# Patient Record
Sex: Male | Born: 1975 | Race: White | Hispanic: No | Marital: Single | Smoking: Never smoker
Health system: Southern US, Community
[De-identification: ages and names within clinical notes are randomized; demographics above are authoritative.]

## PROBLEM LIST (undated history)

## (undated) DIAGNOSIS — F329 Major depressive disorder, single episode, unspecified: Secondary | ICD-10-CM

## (undated) DIAGNOSIS — D649 Anemia, unspecified: Secondary | ICD-10-CM

## (undated) DIAGNOSIS — T7840XA Allergy, unspecified, initial encounter: Secondary | ICD-10-CM

## (undated) DIAGNOSIS — F32A Depression, unspecified: Secondary | ICD-10-CM

## (undated) DIAGNOSIS — K219 Gastro-esophageal reflux disease without esophagitis: Secondary | ICD-10-CM

## (undated) DIAGNOSIS — L709 Acne, unspecified: Secondary | ICD-10-CM

## (undated) DIAGNOSIS — I10 Essential (primary) hypertension: Secondary | ICD-10-CM

## (undated) DIAGNOSIS — E785 Hyperlipidemia, unspecified: Secondary | ICD-10-CM

## (undated) DIAGNOSIS — F419 Anxiety disorder, unspecified: Secondary | ICD-10-CM

## (undated) DIAGNOSIS — K589 Irritable bowel syndrome without diarrhea: Secondary | ICD-10-CM

## (undated) DIAGNOSIS — K644 Residual hemorrhoidal skin tags: Secondary | ICD-10-CM

## (undated) DIAGNOSIS — H509 Unspecified strabismus: Secondary | ICD-10-CM

## (undated) HISTORY — DX: Unspecified strabismus: H50.9

## (undated) HISTORY — DX: Depression, unspecified: F32.A

## (undated) HISTORY — DX: Anxiety disorder, unspecified: F41.9

## (undated) HISTORY — PX: STRABISMUS SURGERY: SHX218

## (undated) HISTORY — DX: Acne, unspecified: L70.9

## (undated) HISTORY — PX: WISDOM TOOTH EXTRACTION: SHX21

## (undated) HISTORY — DX: Essential (primary) hypertension: I10

## (undated) HISTORY — DX: Major depressive disorder, single episode, unspecified: F32.9

## (undated) HISTORY — DX: Anemia, unspecified: D64.9

## (undated) HISTORY — DX: Gastro-esophageal reflux disease without esophagitis: K21.9

## (undated) HISTORY — DX: Hyperlipidemia, unspecified: E78.5

## (undated) HISTORY — DX: Irritable bowel syndrome, unspecified: K58.9

## (undated) HISTORY — PX: EYE SURGERY: SHX253

## (undated) HISTORY — DX: Residual hemorrhoidal skin tags: K64.4

## (undated) HISTORY — PX: FLEXIBLE SIGMOIDOSCOPY: SHX1649

## (undated) HISTORY — DX: Allergy, unspecified, initial encounter: T78.40XA

---

## 2004-01-16 ENCOUNTER — Encounter: Payer: Self-pay | Admitting: Internal Medicine

## 2005-03-12 ENCOUNTER — Ambulatory Visit: Payer: Self-pay | Admitting: Internal Medicine

## 2005-09-03 ENCOUNTER — Ambulatory Visit: Payer: Self-pay | Admitting: Internal Medicine

## 2005-09-10 ENCOUNTER — Ambulatory Visit: Payer: Self-pay | Admitting: Internal Medicine

## 2005-10-27 ENCOUNTER — Ambulatory Visit: Payer: Self-pay | Admitting: Internal Medicine

## 2005-11-10 ENCOUNTER — Ambulatory Visit: Payer: Self-pay | Admitting: Internal Medicine

## 2006-03-04 ENCOUNTER — Ambulatory Visit: Payer: Self-pay | Admitting: Internal Medicine

## 2006-03-11 ENCOUNTER — Ambulatory Visit: Payer: Self-pay | Admitting: Internal Medicine

## 2006-05-05 ENCOUNTER — Ambulatory Visit: Payer: Self-pay | Admitting: Internal Medicine

## 2006-05-11 ENCOUNTER — Ambulatory Visit: Payer: Self-pay | Admitting: Internal Medicine

## 2006-07-04 ENCOUNTER — Ambulatory Visit: Payer: Self-pay | Admitting: Internal Medicine

## 2006-07-11 ENCOUNTER — Ambulatory Visit: Payer: Self-pay | Admitting: Internal Medicine

## 2007-01-11 ENCOUNTER — Ambulatory Visit: Payer: Self-pay | Admitting: Internal Medicine

## 2007-01-11 LAB — CONVERTED CEMR LAB
ALT: 21 units/L (ref 0–40)
AST: 21 units/L (ref 0–37)
Albumin: 4.7 g/dL (ref 3.5–5.2)
Alkaline Phosphatase: 65 units/L (ref 39–117)
Bilirubin, Direct: 0.2 mg/dL (ref 0.0–0.3)
Cholesterol: 153 mg/dL (ref 0–200)
Direct LDL: 78.1 mg/dL
HDL: 34.8 mg/dL — ABNORMAL LOW (ref >39.0)
Total Bilirubin: 1.3 mg/dL — ABNORMAL HIGH (ref 0.3–1.2)
Total CHOL/HDL Ratio: 4.4
Total Protein: 7.2 g/dL (ref 6.0–8.3)
Triglycerides: 281 mg/dL (ref 0–149)
VLDL: 56 mg/dL — ABNORMAL HIGH (ref 0–40)

## 2007-01-18 ENCOUNTER — Ambulatory Visit: Payer: Self-pay | Admitting: Internal Medicine

## 2007-02-27 ENCOUNTER — Ambulatory Visit: Payer: Self-pay | Admitting: Internal Medicine

## 2007-02-27 LAB — CONVERTED CEMR LAB
ALT: 21 units/L (ref 0–40)
AST: 25 units/L (ref 0–37)
Albumin: 4.6 g/dL (ref 3.5–5.2)
Alkaline Phosphatase: 48 units/L (ref 39–117)
Bilirubin, Direct: 0.2 mg/dL (ref 0.0–0.3)
Cholesterol: 132 mg/dL (ref 0–200)
HDL: 38 mg/dL — ABNORMAL LOW (ref >39.0)
LDL Cholesterol: 73 mg/dL (ref 0–99)
Total Bilirubin: 0.9 mg/dL (ref 0.3–1.2)
Total CHOL/HDL Ratio: 3.5
Total Protein: 7.1 g/dL (ref 6.0–8.3)
Triglycerides: 103 mg/dL (ref 0–149)
VLDL: 21 mg/dL (ref 0–40)

## 2007-03-06 ENCOUNTER — Ambulatory Visit: Payer: Self-pay | Admitting: Internal Medicine

## 2007-05-25 DIAGNOSIS — F329 Major depressive disorder, single episode, unspecified: Secondary | ICD-10-CM

## 2007-05-25 DIAGNOSIS — F32A Depression, unspecified: Secondary | ICD-10-CM | POA: Insufficient documentation

## 2007-08-22 ENCOUNTER — Ambulatory Visit: Payer: Self-pay | Admitting: Internal Medicine

## 2007-09-05 ENCOUNTER — Ambulatory Visit: Payer: Self-pay | Admitting: Internal Medicine

## 2007-09-05 DIAGNOSIS — E785 Hyperlipidemia, unspecified: Secondary | ICD-10-CM | POA: Insufficient documentation

## 2007-09-05 LAB — CONVERTED CEMR LAB
ALT: 14 units/L (ref 0–53)
AST: 20 units/L (ref 0–37)
Albumin: 4.6 g/dL (ref 3.5–5.2)
Alkaline Phosphatase: 56 units/L (ref 39–117)
Bilirubin, Direct: 0.1 mg/dL (ref 0.0–0.3)
Cholesterol: 160 mg/dL (ref 0–200)
HDL: 40.2 mg/dL (ref >39.0)
LDL Cholesterol: 98 mg/dL (ref 0–99)
Total Bilirubin: 0.7 mg/dL (ref 0.3–1.2)
Total CHOL/HDL Ratio: 4
Total Protein: 7.3 g/dL (ref 6.0–8.3)
Triglycerides: 107 mg/dL (ref 0–149)
VLDL: 21 mg/dL (ref 0–40)

## 2007-09-12 ENCOUNTER — Ambulatory Visit: Payer: Self-pay | Admitting: Internal Medicine

## 2007-10-04 ENCOUNTER — Encounter: Payer: Self-pay | Admitting: Internal Medicine

## 2007-10-11 ENCOUNTER — Ambulatory Visit: Payer: Self-pay | Admitting: Internal Medicine

## 2008-03-05 ENCOUNTER — Ambulatory Visit: Payer: Self-pay | Admitting: Internal Medicine

## 2008-03-05 LAB — CONVERTED CEMR LAB
Blood in Urine, dipstick: NEGATIVE
Nitrite: NEGATIVE
Specific Gravity, Urine: 1.02
WBC Urine, dipstick: NEGATIVE

## 2008-03-06 LAB — CONVERTED CEMR LAB
ALT: 17 units/L (ref 0–53)
AST: 24 units/L (ref 0–37)
Albumin: 4.4 g/dL (ref 3.5–5.2)
Alkaline Phosphatase: 77 units/L (ref 39–117)
BUN: 10 mg/dL (ref 6–23)
Chloride: 105 meq/L (ref 96–112)
Eosinophils Relative: 31.9 % — ABNORMAL HIGH (ref 0.0–5.0)
Glucose, Bld: 82 mg/dL (ref 70–99)
HCT: 42.7 % (ref 39.0–52.0)
HDL: 31.9 mg/dL — ABNORMAL LOW (ref >39.0)
Monocytes Absolute: 0.5 10*3/uL (ref 0.1–1.0)
Monocytes Relative: 6.2 % (ref 3.0–12.0)
Neutrophils Relative %: 35.6 % — ABNORMAL LOW (ref 43.0–77.0)
Platelets: 285 10*3/uL (ref 150–400)
Potassium: 3.8 meq/L (ref 3.5–5.1)
Total CHOL/HDL Ratio: 4.3
Total Protein: 7.7 g/dL (ref 6.0–8.3)
WBC: 7.6 10*3/uL (ref 4.5–10.5)

## 2008-03-12 ENCOUNTER — Ambulatory Visit: Payer: Self-pay | Admitting: Internal Medicine

## 2008-03-13 LAB — CONVERTED CEMR LAB
Basophils Absolute: 0 10*3/uL (ref 0.0–0.1)
Lymphocytes Relative: 28.7 % (ref 12.0–46.0)
MCHC: 34.5 g/dL (ref 30.0–36.0)
Neutrophils Relative %: 49.6 % (ref 43.0–77.0)
RDW: 11.7 % (ref 11.5–14.6)

## 2008-04-18 ENCOUNTER — Ambulatory Visit: Payer: Self-pay | Admitting: Internal Medicine

## 2008-04-18 LAB — CONVERTED CEMR LAB
Basophils Absolute: 0 10*3/uL (ref 0.0–0.1)
Cholesterol: 220 mg/dL (ref 0–200)
Direct LDL: 122.9 mg/dL
Eosinophils Absolute: 0.2 10*3/uL (ref 0.0–0.7)
HDL: 28.6 mg/dL — ABNORMAL LOW (ref >39.0)
Hemoglobin: 14.3 g/dL (ref 13.0–17.0)
Lymphocytes Relative: 33.6 % (ref 12.0–46.0)
MCHC: 33.3 g/dL (ref 30.0–36.0)
MCV: 86.7 fL (ref 78.0–100.0)
Neutro Abs: 3.2 10*3/uL (ref 1.4–7.7)
RDW: 11.9 % (ref 11.5–14.6)
VLDL: 78 mg/dL — ABNORMAL HIGH (ref 0–40)

## 2008-04-24 ENCOUNTER — Ambulatory Visit: Payer: Self-pay | Admitting: Internal Medicine

## 2008-06-20 ENCOUNTER — Ambulatory Visit: Payer: Self-pay | Admitting: Internal Medicine

## 2008-06-20 LAB — CONVERTED CEMR LAB
ALT: 25 units/L (ref 0–53)
Albumin: 4.4 g/dL (ref 3.5–5.2)
Basophils Relative: 0.7 % (ref 0.0–3.0)
Eosinophils Absolute: 0.3 10*3/uL (ref 0.0–0.7)
Eosinophils Relative: 5.7 % — ABNORMAL HIGH (ref 0.0–5.0)
HCT: 40.8 % (ref 39.0–52.0)
HDL: 29.2 mg/dL — ABNORMAL LOW (ref >39.0)
Hemoglobin: 14 g/dL (ref 13.0–17.0)
MCV: 86.9 fL (ref 78.0–100.0)
Monocytes Absolute: 0.5 10*3/uL (ref 0.1–1.0)
Monocytes Relative: 8.3 % (ref 3.0–12.0)
Neutro Abs: 2.8 10*3/uL (ref 1.4–7.7)
Platelets: 234 10*3/uL (ref 150–400)
RBC: 4.7 M/uL (ref 4.22–5.81)
Total Protein: 6.7 g/dL (ref 6.0–8.3)
WBC: 5.8 10*3/uL (ref 4.5–10.5)

## 2008-06-27 ENCOUNTER — Ambulatory Visit: Payer: Self-pay | Admitting: Internal Medicine

## 2008-09-10 ENCOUNTER — Ambulatory Visit: Payer: Self-pay | Admitting: Internal Medicine

## 2008-09-10 DIAGNOSIS — K219 Gastro-esophageal reflux disease without esophagitis: Secondary | ICD-10-CM | POA: Insufficient documentation

## 2009-01-02 ENCOUNTER — Ambulatory Visit: Payer: Self-pay | Admitting: Internal Medicine

## 2009-01-02 LAB — CONVERTED CEMR LAB
ALT: 37 units/L (ref 0–53)
Bilirubin, Direct: 0.2 mg/dL (ref 0.0–0.3)
Cholesterol: 127 mg/dL (ref 0–200)
Eosinophils Absolute: 0.2 10*3/uL (ref 0.0–0.7)
HCT: 40 % (ref 39.0–52.0)
HDL: 32.8 mg/dL — ABNORMAL LOW (ref >39.0)
Hemoglobin: 14 g/dL (ref 13.0–17.0)
MCV: 86.3 fL (ref 78.0–100.0)
Monocytes Absolute: 0.4 10*3/uL (ref 0.1–1.0)
Monocytes Relative: 7.3 % (ref 3.0–12.0)
Neutro Abs: 3.2 10*3/uL (ref 1.4–7.7)
Platelets: 229 10*3/uL (ref 150–400)
RDW: 12.1 % (ref 11.5–14.6)
Total Bilirubin: 1.3 mg/dL — ABNORMAL HIGH (ref 0.3–1.2)
Total Protein: 7.2 g/dL (ref 6.0–8.3)
Triglycerides: 178 mg/dL — ABNORMAL HIGH (ref 0–149)

## 2009-01-09 ENCOUNTER — Ambulatory Visit: Payer: Self-pay | Admitting: Internal Medicine

## 2009-07-03 ENCOUNTER — Ambulatory Visit: Payer: Self-pay | Admitting: Internal Medicine

## 2009-07-03 LAB — CONVERTED CEMR LAB
AST: 24 units/L (ref 0–37)
Albumin: 4.5 g/dL (ref 3.5–5.2)
Alkaline Phosphatase: 60 units/L (ref 39–117)
Cholesterol: 125 mg/dL (ref 0–200)
HDL: 33.7 mg/dL — ABNORMAL LOW (ref >39.00)
Total CHOL/HDL Ratio: 4
Total Protein: 7.2 g/dL (ref 6.0–8.3)
Triglycerides: 175 mg/dL — ABNORMAL HIGH (ref 0.0–149.0)

## 2009-07-10 ENCOUNTER — Ambulatory Visit: Payer: Self-pay | Admitting: Internal Medicine

## 2009-11-18 ENCOUNTER — Telehealth: Payer: Self-pay | Admitting: Internal Medicine

## 2009-11-20 ENCOUNTER — Encounter: Payer: Self-pay | Admitting: Internal Medicine

## 2010-01-05 ENCOUNTER — Telehealth: Payer: Self-pay | Admitting: Internal Medicine

## 2010-01-20 ENCOUNTER — Ambulatory Visit: Payer: Self-pay | Admitting: Internal Medicine

## 2010-01-20 LAB — CONVERTED CEMR LAB
Albumin: 4.9 g/dL (ref 3.5–5.2)
Alkaline Phosphatase: 57 units/L (ref 39–117)
Basophils Relative: 0.4 % (ref 0.0–3.0)
CO2: 28 meq/L (ref 19–32)
Calcium: 9.6 mg/dL (ref 8.4–10.5)
Chloride: 106 meq/L (ref 96–112)
Eosinophils Absolute: 0.2 10*3/uL (ref 0.0–0.7)
Glucose, Bld: 81 mg/dL (ref 70–99)
Glucose, Urine, Semiquant: NEGATIVE
HCT: 40.9 % (ref 39.0–52.0)
Hemoglobin: 13.7 g/dL (ref 13.0–17.0)
Lymphocytes Relative: 34.3 % (ref 12.0–46.0)
Lymphs Abs: 1.6 10*3/uL (ref 0.7–4.0)
MCHC: 33.6 g/dL (ref 30.0–36.0)
MCV: 89 fL (ref 78.0–100.0)
Neutro Abs: 2.6 10*3/uL (ref 1.4–7.7)
Nitrite: NEGATIVE
Potassium: 3.7 meq/L (ref 3.5–5.1)
RBC: 4.59 M/uL (ref 4.22–5.81)
RDW: 11.9 % (ref 11.5–14.6)
Sodium: 140 meq/L (ref 135–145)
Specific Gravity, Urine: 1.01
TSH: 1.52 microintl units/mL (ref 0.35–5.50)
Total CHOL/HDL Ratio: 3
Total Protein: 7.6 g/dL (ref 6.0–8.3)
WBC Urine, dipstick: NEGATIVE
pH: 7

## 2010-01-29 ENCOUNTER — Ambulatory Visit: Payer: Self-pay | Admitting: Internal Medicine

## 2010-07-28 ENCOUNTER — Ambulatory Visit: Payer: Self-pay | Admitting: Internal Medicine

## 2010-07-28 LAB — CONVERTED CEMR LAB
ALT: 27 units/L (ref 0–53)
AST: 23 units/L (ref 0–37)
Albumin: 4.4 g/dL (ref 3.5–5.2)
Alkaline Phosphatase: 50 units/L (ref 39–117)
Total Bilirubin: 1 mg/dL (ref 0.3–1.2)
Total CHOL/HDL Ratio: 3
Triglycerides: 154 mg/dL — ABNORMAL HIGH (ref 0.0–149.0)

## 2010-08-03 ENCOUNTER — Ambulatory Visit: Payer: Self-pay | Admitting: Internal Medicine

## 2010-08-13 ENCOUNTER — Telehealth: Payer: Self-pay | Admitting: *Deleted

## 2010-08-22 ENCOUNTER — Encounter: Payer: Self-pay | Admitting: Internal Medicine

## 2010-09-21 ENCOUNTER — Ambulatory Visit: Payer: Self-pay | Admitting: Internal Medicine

## 2010-12-22 NOTE — Assessment & Plan Note (Signed)
Summary: 6 MTH ROV // RS   Vital Signs:  Patient profile:   35 year old male Height:      67 inches Weight:      177 pounds BMI:     27.82 Temp:     98.9 degrees F oral BP sitting:   120 / 86  (left arm) Cuff size:   regular  Vitals Entered By: Kern Reap CMA Duncan Dull) (August 03, 2010 10:43 AM) CC: follow-up visit   CC:  follow-up visit.  Current Medications (verified): 1)  Fluoxetine Hcl 20 Mg  Caps (Fluoxetine Hcl) .... One By Mouth Daily 2)  Clarinex 5 Mg  Tabs (Desloratadine) .... One By Mouth Daily 3)  Rhinocort Aqua 32 Mcg/act  Susp (Budesonide (Nasal)) .... Qd As Directed 4)  Astelin 137 Mcg/spray  Soln (Azelastine Hcl) .... Bid As Directed 5)  Zaleplon 10 Mg Caps (Zaleplon) .... Take 1 Tablet By Mouth At Bedtime As Needed 6)  Simvastatin 40 Mg Tabs (Simvastatin) .... Take 1/2  Tablet At Bedtime  Allergies (verified): No Known Drug Allergies  Review of Systems       Flu Vaccine Consent Questions     Do you have a history of severe allergic reactions to this vaccine? no    Any prior history of allergic reactions to egg and/or gelatin? no    Do you have a sensitivity to the preservative Thimersol? no    Do you have a past history of Guillan-Barre Syndrome? no    Do you currently have an acute febrile illness? no    Have you ever had a severe reaction to latex? no    Vaccine information given and explained to patient? yes    Are you currently pregnant? no    Lot Number:[id number]   Exp Date:05/22/2011   Site Given  Left Deltoid IM   Physical Exam  General:  alert and well-developed.   Lungs:  Normal respiratory effort, chest expands symmetrically. Lungs are clear to auscultation, no crackles or wheezes. Heart:  normal rate and regular rhythm.     Impression & Recommendations:  Problem # 1:  HYPERLIPIDEMIA (ICD-272.4)  His updated medication list for this problem includes:    Simvastatin 40 Mg Tabs (Simvastatin) .Marland Kitchen... Take 1/2  tablet at  bedtime  Problem # 2:  GERD (ICD-530.81) no sxs and no meds  Problem # 3:  DEPRESSION (ICD-311) controlled continue current medications  His updated medication list for this problem includes:    Fluoxetine Hcl 20 Mg Caps (Fluoxetine hcl) ..... One by mouth daily  Complete Medication List: 1)  Fluoxetine Hcl 20 Mg Caps (Fluoxetine hcl) .... One by mouth daily 2)  Clarinex 5 Mg Tabs (Desloratadine) .... One by mouth daily 3)  Rhinocort Aqua 32 Mcg/act Susp (Budesonide (nasal)) .... Qd as directed 4)  Astelin 137 Mcg/spray Soln (Azelastine hcl) .... Bid as directed 5)  Zaleplon 10 Mg Caps (Zaleplon) .... Take 1 tablet by mouth at bedtime as needed 6)  Simvastatin 40 Mg Tabs (Simvastatin) .... Take 1/2  tablet at bedtime  Other Orders: Admin 1st Vaccine (16109) Flu Vaccine 61yrs + (60454)  Patient Instructions: 1)  Please schedule a follow-up appointment in 6 months. CPX

## 2010-12-22 NOTE — Progress Notes (Signed)
Summary: refill on zaleplon  Phone Note From Pharmacy   Caller: [facility] Reason for Call: Needs renewal Details for Reason: zaleplon 10mg  Initial call taken by: Romualdo Bolk, CMA Duncan Dull),  August 13, 2010 2:59 PM  Follow-up for Phone Call        ok Follow-up by: Birdie Sons MD,  August 13, 2010 5:32 PM  Additional Follow-up for Phone Call Additional follow up Details #1::        Rx faxed to pharmacy Additional Follow-up by: Romualdo Bolk, CMA (AAMA),  August 14, 2010 11:03 AM    Prescriptions: ZALEPLON 10 MG CAPS (ZALEPLON) Take 1 tablet by mouth at bedtime as needed  #20 x 0   Entered by:   Romualdo Bolk, CMA (AAMA)   Authorized by:   Birdie Sons MD   Signed by:   Romualdo Bolk, CMA (AAMA) on 08/14/2010   Method used:   Handwritten   RxID:   [id number]

## 2010-12-22 NOTE — Assessment & Plan Note (Signed)
Summary: CPX//SLM/PT RESCD FROM BUMP//CCM   Vital Signs:  Patient profile:   35 year old male Height:      67 inches Weight:      182 pounds Temp:     98.8 degrees F oral Pulse rate:   67 / minute Resp:     12 per minute BP sitting:   120 / 68  Vitals Entered By: Lynann Beaver CMA (January 29, 2010 2:14 PM) CC: cpx Is Patient Diabetic? No Pain Assessment Patient in pain? no        CC:  cpx.  History of Present Illness: cpx  Current Problems (verified): 1)  Gerd  (ICD-530.81) 2)  Preventive Health Care  (ICD-V70.0) 3)  Hyperlipidemia  (ICD-272.4) 4)  Depression  (ICD-311)  Current Medications (verified): 1)  Fluoxetine Hcl 20 Mg  Caps (Fluoxetine Hcl) .... One By Mouth Daily 2)  Clarinex 5 Mg  Tabs (Desloratadine) .... One By Mouth Daily 3)  Rhinocort Aqua 32 Mcg/act  Susp (Budesonide (Nasal)) .... Qd As Directed 4)  Astelin 137 Mcg/spray  Soln (Azelastine Hcl) .... Bid As Directed 5)  Zaleplon 10 Mg Caps (Zaleplon) .... Take 1 Tablet By Mouth At Bedtime As Needed 6)  Simvastatin 40 Mg Tabs (Simvastatin) .... Take One Tablet At Bedtime 7)  Sudafed Pe Maximum Strength 10 Mg Tabs (Phenylephrine Hcl) .... Every Morning 8)  Tylenol Extra Strength 500 Mg Tabs (Acetaminophen) .... As Needed 9)  Advil 200 Mg Caps (Ibuprofen) .... As Needed 10)  Gas-X Extra Strength 125 Mg Caps (Simethicone) .... As Needed 11)  Saline Nasal Spray 0.65 % Soln (Saline) .... As Needed 12)  Visine Tears 0.2-0.2-1 % Soln (Glycerin-Hypromellose-Peg 400) .... As Needed  Allergies (verified): No Known Drug Allergies  Past History:  Past Medical History: Last updated: 09/10/2008 Allergic rhinitis Depression Acne GERD  Past Surgical History: Last updated: 03/12/2008 Denies surgical history  Family History: Last updated: 08/22/2007 Family History mother with breast CA' htn Family History Hypertension  Social History: Last updated: 05/25/2007 Single Alcohol use-yes Never  Smoked  Risk Factors: Smoking Status: never (05/25/2007)  Review of Systems       All other systems reviewed and were negative    Impression & Recommendations:  Problem # 1:  PREVENTIVE HEALTH CARE (ICD-V70.0) health maint UTD  Problem # 2:  HYPERLIPIDEMIA (ICD-272.4)  decrease simvastatin recheck 6 months His updated medication list for this problem includes:    Simvastatin 40 Mg Tabs (Simvastatin) .Marland Kitchen... Take 1/2  tablet at bedtime  Labs Reviewed: SGOT: 25 (01/20/2010)   SGPT: 29 (01/20/2010)   HDL:42.50 (01/20/2010), 33.70 (07/03/2009)  LDL:35 (01/20/2010), 56 (07/03/2009)  Chol:113 (01/20/2010), 125 (07/03/2009)  Trig:180.0 (01/20/2010), 175.0 (07/03/2009)  Complete Medication List: 1)  Fluoxetine Hcl 20 Mg Caps (Fluoxetine hcl) .... One by mouth daily 2)  Clarinex 5 Mg Tabs (Desloratadine) .... One by mouth daily 3)  Rhinocort Aqua 32 Mcg/act Susp (Budesonide (nasal)) .... Qd as directed 4)  Astelin 137 Mcg/spray Soln (Azelastine hcl) .... Bid as directed 5)  Zaleplon 10 Mg Caps (Zaleplon) .... Take 1 tablet by mouth at bedtime as needed 6)  Simvastatin 40 Mg Tabs (Simvastatin) .... Take 1/2  tablet at bedtime  Other Orders: Tdap => 41yrs IM (16109) Admin 1st Vaccine (60454)  Patient Instructions: 1)  Please schedule a follow-up appointment in 6 months.   Physical Exam General Appearance: well developed, well nourished, no acute distress Eyes: conjunctiva and lids normal, PERRL, EOMI,  Ears, Nose, Mouth, Throat: TM clear,  nares clear, oral exam WNL Neck: supple, no lymphadenopathy, no thyromegaly, no JVD Respiratory: clear to auscultation and percussion, respiratory effort normal Cardiovascular: regular rate and rhythm, S1-S2, no murmur, rub or gallop, no bruits, peripheral pulses normal and symmetric, no cyanosis, clubbing, edema or varicosities Chest: no scars, masses, tenderness; no asymmetry, skin changes, nipple discharge, no gynecomastia   Gastrointestinal:  soft, non-tender; no hepatosplenomegaly, masses; active bowel sounds all quadrants, guaiac negative stool; no masses, tenderness, hemorrhoids  Genitourinary: no hernia, testicular mass, penile discharge, priapism Lymphatic: no cervical, axillary or inguinal adenopathy Musculoskeletal: gait normal, muscle tone and strength WNL, no joint swelling, effusions, discoloration, crepitus  Skin: clear, good turgor, color WNL, no rashes, lesions, or ulcerations Neurologic: normal mental status, normal reflexes, normal strength, sensation, and motion Psychiatric: alert; oriented to person, place and time Other Exam:      Immunizations Administered:  Tetanus Vaccine:    Vaccine Type: Tdap    Site: left deltoid    Mfr: GlaxoSmithKline    Dose: 0.5 ml    Route: IM    Given by: Lynann Beaver CMA    Exp. Date: 01/17/2012    Lot #: [id number]   Prevention & Chronic Care Immunizations   Influenza vaccine: Fluvax Non-MCR  (09/04/2007)   Influenza vaccine due: 07/23/2010    Tetanus booster: 01/29/2010: Tdap    Pneumococcal vaccine: Not documented  Other Screening   Smoking status: never  (05/25/2007)  Lipids   Total Cholesterol: 113  (01/20/2010)   LDL: 35  (01/20/2010)   LDL Direct: 122.9  (04/18/2008)   HDL: 42.50  (01/20/2010)   Triglycerides: 180.0  (01/20/2010)    SGOT (AST): 25  (01/20/2010)   SGPT (ALT): 29  (01/20/2010)   Alkaline phosphatase: 57  (01/20/2010)   Total bilirubin: 1.5  (01/20/2010)  Self-Management Support :    Lipid self-management support: Not documented    Nursing Instructions: Give tetanus booster today    Immunizations Administered:  Tetanus Vaccine:    Vaccine Type: Tdap    Site: left deltoid    Mfr: GlaxoSmithKline    Dose: 0.5 ml    Route: IM    Given by: Lynann Beaver CMA    Exp. Date: 01/17/2012    Lot #: [id number]

## 2010-12-22 NOTE — Therapy (Signed)
Summary: Hearing Difficulties/[facility]  Hearing Difficulties/[facility]   Imported By: Maryln Gottron 10/09/2010 11:24:32  _____________________________________________________________________  External Attachment:    Type:   Image     Comment:   External Document

## 2010-12-22 NOTE — Medication Information (Signed)
Summary: Rx for Zaleplon Cap/[facility]  Rx for Zaleplon Cap/[facility]   Imported By: Maryln Gottron 11/24/2009 14:17:20  _____________________________________________________________________  External Attachment:    Type:   Image     Comment:   External Document

## 2010-12-22 NOTE — Assessment & Plan Note (Signed)
Summary: cold symptoms for over a week/cjr   Vital Signs:  Patient profile:   35 year old male Weight:      185 pounds Temp:     98.7 degrees F oral Pulse rhythm:   regular BP sitting:   138 / 80  Vitals Entered By: Lynann Beaver CMA AAMA (September 21, 2010 11:41 AM) CC: chest congestion and cough Is Patient Diabetic? No Pain Assessment Patient in pain? no        CC:  chest congestion and cough.  History of Present Illness: 2 week hx of illness. sxs have ben persistent/gradually worsening. seems to have started with a rash on chest now left with fatigue and persistent cough---especially nocturnal coughing paroxysms no fever/chills no sweats  Current Medications (verified): 1)  Fluoxetine Hcl 20 Mg  Caps (Fluoxetine Hcl) .... One By Mouth Daily 2)  Clarinex 5 Mg  Tabs (Desloratadine) .... One By Mouth Daily 3)  Rhinocort Aqua 32 Mcg/act  Susp (Budesonide (Nasal)) .... Qd As Directed 4)  Astelin 137 Mcg/spray  Soln (Azelastine Hcl) .... Bid As Directed 5)  Zaleplon 10 Mg Caps (Zaleplon) .... Take 1 Tablet By Mouth At Bedtime As Needed 6)  Simvastatin 40 Mg Tabs (Simvastatin) .... Take 1/2  Tablet At Bedtime  Allergies (verified): No Known Drug Allergies  Past History:  Past Medical History: Last updated: 09/10/2008 Allergic rhinitis Depression Acne GERD  Past Surgical History: Last updated: 03/12/2008 Denies surgical history  Family History: Last updated: 08/22/2007 Family History mother with breast CA' htn Family History Hypertension  Social History: Last updated: 05/25/2007 Single Alcohol use-yes Never Smoked  Risk Factors: Smoking Status: never (05/25/2007)  Physical Exam  General:  alert and well-developed.   Nose:  nose piercing noted and no external erythema.   Neck:  No deformities, masses, or tenderness noted. Lungs:  Normal respiratory effort, chest expands symmetrically. Lungs are clear to auscultation, no crackles or wheezes. Skin:   turgor normal and color normal.     Impression & Recommendations:  Problem # 1:  BRONCHITIS-ACUTE (ICD-466.0) by hx emperic ABX call if sxs persist His updated medication list for this problem includes:    Doxycycline Hyclate 100 Mg Caps (Doxycycline hyclate) .Marland Kitchen... Take 1 tab twice a day    Hydromet 5-1.5 Mg/39ml Syrp (Hydrocodone-homatropine) .Marland Kitchen... 1 tsp three times a day as needed cough  Complete Medication List: 1)  Fluoxetine Hcl 20 Mg Caps (Fluoxetine hcl) .... One by mouth daily 2)  Clarinex 5 Mg Tabs (Desloratadine) .... One by mouth daily 3)  Rhinocort Aqua 32 Mcg/act Susp (Budesonide (nasal)) .... Qd as directed 4)  Astelin 137 Mcg/spray Soln (Azelastine hcl) .... Bid as directed 5)  Zaleplon 10 Mg Caps (Zaleplon) .... Take 1 tablet by mouth at bedtime as needed 6)  Simvastatin 40 Mg Tabs (Simvastatin) .... Take 1/2  tablet at bedtime 7)  Doxycycline Hyclate 100 Mg Caps (Doxycycline hyclate) .... Take 1 tab twice a day 8)  Hydromet 5-1.5 Mg/56ml Syrp (Hydrocodone-homatropine) .Marland Kitchen.. 1 tsp three times a day as needed cough Prescriptions: HYDROMET 5-1.5 MG/5ML SYRP (HYDROCODONE-HOMATROPINE) 1 tsp three times a day as needed cough  #240cc x 0   Entered and Authorized by:   Birdie Sons MD   Signed by:   Birdie Sons MD on 09/21/2010   Method used:   Print then Give to Patient   RxID:   [id number] DOXYCYCLINE HYCLATE 100 MG CAPS (DOXYCYCLINE HYCLATE) Take 1 tab twice a day  #20 x 0  Entered and Authorized by:   Birdie Sons MD   Signed by:   Birdie Sons MD on 09/21/2010   Method used:   Electronically to        [facility] [address] (retail)       [phone]       [place], [place]  [address]       Ph: [phone]       Fax: [phone]   RxID:   [id number]    Orders Added: 1)  Est. Patient Level III [27253]

## 2010-12-22 NOTE — Progress Notes (Signed)
Summary: simvastatin  Phone Note Refill Request Message from:  Fax from Pharmacy  Refills Requested: Medication #1:  SIMVASTATIN 40 MG TABS Take one tablet at bedtime [facility] [facility]---[phone]        FAX---[phone]  Initial call taken by: Warnell Forester,  January 05, 2010 12:56 PM    Prescriptions: SIMVASTATIN 40 MG TABS (SIMVASTATIN) Take one tablet at bedtime  #90 Tablet x 1   Entered by:   Gladis Riffle, RN   Authorized by:   Birdie Sons MD   Signed by:   Gladis Riffle, RN on 01/05/2010   Method used:   Electronically to        [phone] (retail)       [address]       [place], [place]  [address]       Ph: [phone]       Fax: [phone]   RxID:   [id number]

## 2011-01-13 ENCOUNTER — Other Ambulatory Visit: Payer: Self-pay | Admitting: *Deleted

## 2011-01-13 MED ORDER — ZALEPLON 10 MG PO CAPS: 10.0000 mg | ORAL_CAPSULE | Freq: Every day | ORAL | Status: DC

## 2011-01-22 ENCOUNTER — Other Ambulatory Visit: Payer: PRIVATE HEALTH INSURANCE

## 2011-01-22 ENCOUNTER — Other Ambulatory Visit: Payer: Self-pay | Admitting: Internal Medicine

## 2011-01-22 ENCOUNTER — Encounter (INDEPENDENT_AMBULATORY_CARE_PROVIDER_SITE_OTHER): Payer: Self-pay | Admitting: *Deleted

## 2011-01-22 DIAGNOSIS — Z0000 Encounter for general adult medical examination without abnormal findings: Secondary | ICD-10-CM

## 2011-01-22 LAB — CBC WITH DIFFERENTIAL/PLATELET
Basophils Relative: 0.4 % (ref 0.0–3.0)
Eosinophils Absolute: 0.1 10*3/uL (ref 0.0–0.7)
Eosinophils Relative: 2.5 % (ref 0.0–5.0)
Lymphocytes Relative: 36 % (ref 12.0–46.0)
MCHC: 34.7 g/dL (ref 30.0–36.0)
Neutrophils Relative %: 52.6 % (ref 43.0–77.0)
Platelets: 238 10*3/uL (ref 150.0–400.0)
RBC: 4.69 Mil/uL (ref 4.22–5.81)
WBC: 5.5 10*3/uL (ref 4.5–10.5)

## 2011-01-22 LAB — URINALYSIS
Hgb urine dipstick: NEGATIVE
Nitrite: NEGATIVE
Total Protein, Urine: NEGATIVE
Urine Glucose: NEGATIVE
pH: 6.5 (ref 5.0–8.0)

## 2011-01-22 LAB — BASIC METABOLIC PANEL
BUN: 10 mg/dL (ref 6–23)
CO2: 27 mEq/L (ref 19–32)
Calcium: 9.7 mg/dL (ref 8.4–10.5)
Chloride: 106 mEq/L (ref 96–112)
Creatinine, Ser: 0.9 mg/dL (ref 0.4–1.5)
GFR: 101.98 mL/min (ref >60.00)
Glucose, Bld: 77 mg/dL (ref 70–99)
Potassium: 4.3 mEq/L (ref 3.5–5.1)
Sodium: 140 mEq/L (ref 135–145)

## 2011-01-22 LAB — LIPID PANEL
HDL: 38.1 mg/dL — ABNORMAL LOW (ref >39.00)
LDL Cholesterol: 60 mg/dL (ref 0–99)
VLDL: 34.6 mg/dL (ref 0.0–40.0)

## 2011-01-22 LAB — HEPATIC FUNCTION PANEL
Alkaline Phosphatase: 53 U/L (ref 39–117)
Bilirubin, Direct: 0.2 mg/dL (ref 0.0–0.3)
Total Bilirubin: 1.2 mg/dL (ref 0.3–1.2)
Total Protein: 6.9 g/dL (ref 6.0–8.3)

## 2011-01-29 ENCOUNTER — Other Ambulatory Visit: Payer: Self-pay

## 2011-02-05 ENCOUNTER — Encounter: Payer: Self-pay | Admitting: Internal Medicine

## 2011-02-16 ENCOUNTER — Encounter: Payer: Self-pay | Admitting: Internal Medicine

## 2011-02-17 ENCOUNTER — Encounter: Payer: Self-pay | Admitting: Internal Medicine

## 2011-02-17 ENCOUNTER — Ambulatory Visit (INDEPENDENT_AMBULATORY_CARE_PROVIDER_SITE_OTHER): Payer: PRIVATE HEALTH INSURANCE | Admitting: Internal Medicine

## 2011-02-17 VITALS — BP 118/82 | HR 80 | Temp 98.7°F | Ht 67.5 in | Wt 183.0 lb

## 2011-02-17 DIAGNOSIS — Z0000 Encounter for general adult medical examination without abnormal findings: Secondary | ICD-10-CM

## 2011-02-17 NOTE — Progress Notes (Signed)
  Subjective:    Patient ID: Garrett [name], male    DOB: 02/01/1976, 35 y.o.   MRN: [id number]  HPI cpx Past Medical History  Diagnosis Date  . Allergy   . Depression   . GERD (gastroesophageal reflux disease)   . Acne    No past surgical history on file.  reports that he has never smoked. He does not have any smokeless tobacco history on file. He reports that he drinks alcohol. His drug history not on file. family history includes Cancer in his mother and Hypertension in his mother. No Known Allergies    Review of Systems  patient denies chest pain, shortness of breath, orthopnea. Denies lower extremity edema, abdominal pain, change in appetite, change in bowel movements. Patient denies rashes, musculoskeletal complaints. No other specific complaints in a complete review of systems.      Objective:   Physical Exam Well-developed male in no acute distress. HEENT exam atraumatic, normocephalic, extraocular muscles are intact. Conjunctivae are pink without exudate. Neck is supple without lymphadenopathy, thyromegaly, jugular venous distention. Chest is clear to auscultation without increased work of breathing. Cardiac exam S1-S2 are regular. The PMI is normal. No significant murmurs or gallops. Abdominal exam active bowel sounds, soft, nontender. No abdominal bruits. Extremities no clubbing cyanosis or edema. Peripheral pulses are normal without bruits. Neurologic exam alert and oriented without any motor or sensory deficits.        Assessment & Plan:  Well visit  Health maint utd Advised weight loss, regular exercise

## 2011-04-05 ENCOUNTER — Other Ambulatory Visit: Payer: Self-pay | Admitting: Internal Medicine

## 2011-10-08 ENCOUNTER — Other Ambulatory Visit: Payer: Self-pay | Admitting: Internal Medicine

## 2011-11-08 ENCOUNTER — Other Ambulatory Visit: Payer: Self-pay | Admitting: Internal Medicine

## 2012-02-11 ENCOUNTER — Other Ambulatory Visit (INDEPENDENT_AMBULATORY_CARE_PROVIDER_SITE_OTHER): Payer: BC Managed Care – PPO

## 2012-02-11 DIAGNOSIS — Z0000 Encounter for general adult medical examination without abnormal findings: Secondary | ICD-10-CM

## 2012-02-11 LAB — HEPATIC FUNCTION PANEL
Alkaline Phosphatase: 51 U/L (ref 39–117)
Bilirubin, Direct: 0.1 mg/dL (ref 0.0–0.3)
Total Bilirubin: 0.9 mg/dL (ref 0.3–1.2)

## 2012-02-11 LAB — TSH: TSH: 1.61 u[IU]/mL (ref 0.35–5.50)

## 2012-02-11 LAB — CBC WITH DIFFERENTIAL/PLATELET
Basophils Absolute: 0 10*3/uL (ref 0.0–0.1)
Eosinophils Absolute: 0.4 10*3/uL (ref 0.0–0.7)
Lymphocytes Relative: 32.9 % (ref 12.0–46.0)
MCHC: 34 g/dL (ref 30.0–36.0)
Neutrophils Relative %: 52.4 % (ref 43.0–77.0)
RBC: 4.64 Mil/uL (ref 4.22–5.81)
RDW: 12.5 % (ref 11.5–14.6)

## 2012-02-11 LAB — LIPID PANEL
HDL: 45.1 mg/dL (ref >39.00)
Total CHOL/HDL Ratio: 3
VLDL: 31.8 mg/dL (ref 0.0–40.0)

## 2012-02-11 LAB — POCT URINALYSIS DIPSTICK
Bilirubin, UA: NEGATIVE
Blood, UA: NEGATIVE
Nitrite, UA: NEGATIVE
Urobilinogen, UA: 0.2
pH, UA: 6.5

## 2012-02-11 LAB — BASIC METABOLIC PANEL
CO2: 25 mEq/L (ref 19–32)
Calcium: 9.8 mg/dL (ref 8.4–10.5)
Creatinine, Ser: 0.7 mg/dL (ref 0.4–1.5)
Glucose, Bld: 84 mg/dL (ref 70–99)

## 2012-02-18 ENCOUNTER — Encounter: Payer: PRIVATE HEALTH INSURANCE | Admitting: Internal Medicine

## 2012-02-21 ENCOUNTER — Encounter: Payer: Self-pay | Admitting: Internal Medicine

## 2012-02-21 ENCOUNTER — Ambulatory Visit (INDEPENDENT_AMBULATORY_CARE_PROVIDER_SITE_OTHER): Payer: BC Managed Care – PPO | Admitting: Internal Medicine

## 2012-02-21 ENCOUNTER — Other Ambulatory Visit: Payer: Self-pay | Admitting: Internal Medicine

## 2012-02-21 DIAGNOSIS — Z0000 Encounter for general adult medical examination without abnormal findings: Secondary | ICD-10-CM

## 2012-02-21 DIAGNOSIS — J309 Allergic rhinitis, unspecified: Secondary | ICD-10-CM

## 2012-02-21 MED ORDER — ZALEPLON 10 MG PO CAPS: 10.0000 mg | ORAL_CAPSULE | Freq: Every evening | ORAL | Status: DC | PRN

## 2012-02-21 NOTE — Progress Notes (Signed)
Patient ID: LEVII HAIRFIELD, male   DOB: [birth year], 36 y.o.   MRN: [id number]  cpx He sees allergist regularly (note on sudafed bid scheduled).  Past Medical History  Diagnosis Date  . Allergy   . Depression   . GERD (gastroesophageal reflux disease)   . Acne     History   Social History  . Marital Status: Single    Spouse Name: N/A    Number of Children: N/A  . Years of Education: N/A   Occupational History  . Not on file.   Social History Main Topics  . Smoking status: Never Smoker   . Smokeless tobacco: Not on file  . Alcohol Use: Yes  . Drug Use:   . Sexually Active:    Other Topics Concern  . Not on file   Social History Narrative  . No narrative on file    No past surgical history on file.  Family History  Problem Relation Age of Onset  . Cancer Mother     cancer  . Hypertension Mother     No Known Allergies  Current Outpatient Prescriptions on File Prior to Visit  Medication Sig Dispense Refill  . azelastine (ASTELIN) 137 MCG/SPRAY nasal spray 1 spray by Nasal route 2 (two) times daily. Use in each nostril as directed       . budesonide (RHINOCORT AQUA) 32 MCG/ACT nasal spray 1 spray by Nasal route daily.        Marland Kitchen desloratadine (CLARINEX) 5 MG tablet Take 5 mg by mouth daily.        Marland Kitchen FLUoxetine (PROZAC) 20 MG capsule TAKE ONE CAPSULE BY MOUTH EVERY DAY  30 capsule  5  . phenylephrine (SUDAFED PE) 10 MG TABS Take 10 mg by mouth every 4 (four) hours as needed.        . simvastatin (ZOCOR) 40 MG tablet TAKE ONE TABLET BY MOUTH AT BEDTIME  90 tablet  2     patient denies chest pain, shortness of breath, orthopnea. Denies lower extremity edema, abdominal pain, change in appetite, change in bowel movements. Patient denies rashes, musculoskeletal complaints. No other specific complaints in a complete review of systems.   BP 142/86  Pulse 96  Temp(Src) 98.8 F (37.1 C) (Oral)  Ht 5\' 7"  (1.702 m)  Wt 185 lb (83.915 kg)  BMI 28.97 kg/m2 Well-developed  male in no acute distress. HEENT exam atraumatic, normocephalic, extraocular muscles are intact. Conjunctivae are pink without exudate. Neck is supple without lymphadenopathy, thyromegaly, jugular venous distention. Chest is clear to auscultation without increased work of breathing. Cardiac exam S1-S2 are regular. The PMI is normal. No significant murmurs or gallops. Abdominal exam active bowel sounds, soft, nontender. No abdominal bruits. Extremities no clubbing cyanosis or edema. Peripheral pulses are normal without bruits. Neurologic exam alert and oriented without any motor or sensory deficits.   A/P: well visit, health maintenance UTD  Blood Pressure--- he will monitor at home and call me with BP results.

## 2012-02-21 NOTE — Patient Instructions (Signed)
Monitor your BP at home:  Goal BP less than 135/85. Fax recordings to [phone] or drop them by the office

## 2012-04-04 ENCOUNTER — Other Ambulatory Visit: Payer: Self-pay | Admitting: Internal Medicine

## 2012-08-14 ENCOUNTER — Telehealth: Payer: Self-pay | Admitting: Internal Medicine

## 2012-08-14 ENCOUNTER — Ambulatory Visit (INDEPENDENT_AMBULATORY_CARE_PROVIDER_SITE_OTHER): Payer: BC Managed Care – PPO | Admitting: Internal Medicine

## 2012-08-14 ENCOUNTER — Encounter: Payer: Self-pay | Admitting: Internal Medicine

## 2012-08-14 VITALS — BP 122/88 | Temp 99.0°F | Wt 182.0 lb

## 2012-08-14 DIAGNOSIS — R197 Diarrhea, unspecified: Secondary | ICD-10-CM

## 2012-08-14 DIAGNOSIS — K529 Noninfective gastroenteritis and colitis, unspecified: Secondary | ICD-10-CM

## 2012-08-14 DIAGNOSIS — K589 Irritable bowel syndrome without diarrhea: Secondary | ICD-10-CM | POA: Insufficient documentation

## 2012-08-14 LAB — CBC WITH DIFFERENTIAL/PLATELET
Basophils Absolute: 0 10*3/uL (ref 0.0–0.1)
Basophils Relative: 0.5 % (ref 0.0–3.0)
Eosinophils Relative: 1.8 % (ref 0.0–5.0)
Hemoglobin: 14.4 g/dL (ref 13.0–17.0)
Lymphocytes Relative: 33.3 % (ref 12.0–46.0)
Monocytes Relative: 10 % (ref 3.0–12.0)
Neutro Abs: 2.6 10*3/uL (ref 1.4–7.7)
RBC: 4.86 Mil/uL (ref 4.22–5.81)

## 2012-08-14 MED ORDER — METRONIDAZOLE 250 MG PO TABS: 250.0000 mg | ORAL_TABLET | Freq: Three times a day (TID) | ORAL | Status: DC

## 2012-08-14 MED ORDER — CIPROFLOXACIN HCL 250 MG PO TABS: 250.0000 mg | ORAL_TABLET | Freq: Two times a day (BID) | ORAL | Status: DC

## 2012-08-14 NOTE — Assessment & Plan Note (Signed)
36 year old white male with intermittent diarrhea for last 2 weeks. He has low-grade fever. I suspect possible infectious etiology. Empiric treatment with Cipro 250 mg twice daily and metronidazole 250 mg 3 times a day for 10 days. Send stool for culture, stool lactoferrin, stools for C. Difficile by PCR and ova and parasites.  Also check CBCD, sed rate, thyroid studies and celiac panel.

## 2012-08-14 NOTE — Telephone Encounter (Signed)
Patient calling, has had diarrhea x 10 days and wants to know what to do.  He is urinating.  Denies any fever. Needs to be seen in 24 hours.  Scheduled this afternoon at 2p with Dr. Artist Pais.

## 2012-08-14 NOTE — Progress Notes (Signed)
Subjective:    Patient ID: Garrett [name], male    DOB: July 03, 1976, 36 y.o.   MRN: [id number]  HPI  36 year old white male complains of diarrhea for the last 2 weeks. His symptoms are intermittent. Patient describes 3-4 bowel movements per day. They can be liquid or loose in nature. He denies any blood or any mucus in the stools. No report of foreign travel. He also denies any recent antibiotic use.  He has abdominal cramping with diarrhea. He has taken Imodium over-the-counter and follow a bland diet which seems to help but then diarrhea returns when he resumes his normal diet.  He has had occasional rare episodes of loose stools in the past. He denies any family history of inflammatory bowel disease. He has low-grade fever today. He does not recall any unusual food intake.   Review of Systems Negative for fever chills, negative for joint pains or unusual rash.  Past Medical History  Diagnosis Date  . Allergy   . Depression   . GERD (gastroesophageal reflux disease)   . Acne     History   Social History  . Marital Status: Single    Spouse Name: N/A    Number of Children: N/A  . Years of Education: N/A   Occupational History  . Not on file.   Social History Main Topics  . Smoking status: Never Smoker   . Smokeless tobacco: Not on file  . Alcohol Use: Yes  . Drug Use:   . Sexually Active:    Other Topics Concern  . Not on file   Social History Narrative  . No narrative on file    No past surgical history on file.  Family History  Problem Relation Age of Onset  . Cancer Mother     cancer  . Hypertension Mother     No Known Allergies  Current Outpatient Prescriptions on File Prior to Visit  Medication Sig Dispense Refill  . azelastine (ASTELIN) 137 MCG/SPRAY nasal spray 1 spray by Nasal route 2 (two) times daily. Use in each nostril as directed       . budesonide (RHINOCORT AQUA) 32 MCG/ACT nasal spray 1 spray by Nasal route daily.        Marland Kitchen desloratadine  (CLARINEX) 5 MG tablet Take 5 mg by mouth daily.        Marland Kitchen FLUoxetine (PROZAC) 20 MG capsule TAKE ONE CAPSULE BY MOUTH EVERY DAY  30 capsule  11  . NON FORMULARY Allergy shots weekly      . phenylephrine (SUDAFED PE) 10 MG TABS Take 10 mg by mouth every 4 (four) hours as needed.        . simvastatin (ZOCOR) 40 MG tablet TAKE ONE TABLET BY MOUTH AT BEDTIME  90 tablet  2  . DISCONTD: zaleplon (SONATA) 10 MG capsule Take 1 capsule (10 mg total) by mouth at bedtime as needed.  15 capsule  1  . DISCONTD: zaleplon (SONATA) 10 MG capsule Take 1 capsule (10 mg total) by mouth at bedtime.  20 capsule  2    BP 122/88  Temp 99 F (37.2 C) (Oral)  Wt 182 lb (82.555 kg)       Objective:   Physical Exam  Constitutional: He is oriented to person, place, and time. He appears well-developed and well-nourished.  HENT:  Head: Normocephalic.  Right Ear: External ear normal.  Left Ear: External ear normal.  Mouth/Throat: Oropharynx is clear and moist.  Neck: Neck supple.  Cardiovascular: Normal rate,  regular rhythm and normal heart sounds.   Pulmonary/Chest: Effort normal and breath sounds normal. He has no wheezes.  Abdominal: Soft. Bowel sounds are normal. He exhibits no mass. There is no tenderness.  Genitourinary: Rectum normal. Guaiac negative stool.  Musculoskeletal: Normal range of motion. He exhibits no edema.  Neurological: He is alert and oriented to person, place, and time. A cranial nerve deficit is present.  Skin: Skin is warm and dry.  Psychiatric: He has a normal mood and affect. His behavior is normal.          Assessment & Plan:

## 2012-08-14 NOTE — Patient Instructions (Addendum)
Follow bland diet as directed Please call our office if your symptoms do not improve or gets worse.

## 2012-08-15 LAB — T4, FREE: Free T4: 0.83 ng/dL (ref 0.60–1.60)

## 2012-08-15 LAB — BASIC METABOLIC PANEL
Calcium: 10.1 mg/dL (ref 8.4–10.5)
GFR: 102.4 mL/min (ref >60.00)
Sodium: 143 mEq/L (ref 135–145)

## 2012-08-15 LAB — TSH: TSH: 1.04 u[IU]/mL (ref 0.35–5.50)

## 2012-08-22 LAB — STOOL CULTURE

## 2012-08-28 ENCOUNTER — Ambulatory Visit: Payer: BC Managed Care – PPO | Admitting: Internal Medicine

## 2012-09-11 ENCOUNTER — Ambulatory Visit (INDEPENDENT_AMBULATORY_CARE_PROVIDER_SITE_OTHER): Payer: BC Managed Care – PPO | Admitting: Internal Medicine

## 2012-09-11 VITALS — BP 136/84 | HR 96 | Temp 98.6°F | Wt 180.0 lb

## 2012-09-11 DIAGNOSIS — K529 Noninfective gastroenteritis and colitis, unspecified: Secondary | ICD-10-CM

## 2012-09-11 DIAGNOSIS — Z23 Encounter for immunization: Secondary | ICD-10-CM

## 2012-09-11 DIAGNOSIS — R197 Diarrhea, unspecified: Secondary | ICD-10-CM

## 2012-09-11 MED ORDER — ROSUVASTATIN CALCIUM 10 MG PO TABS: 10.0000 mg | ORAL_TABLET | Freq: Every day | ORAL | Status: DC

## 2012-09-11 NOTE — Assessment & Plan Note (Signed)
36 year old white male with chronic intermittent loose stools. His stool studies were negative. Unfortunately we were unable to obtain celiac panel and sedimentation rate. Refer to Dr. Leone Payor for further evaluation and treatment. Consider EGD for small bowel biopsy and or colonoscopy.  IBS is also a consideration.  Continue imodium as needed for now.

## 2012-09-11 NOTE — Patient Instructions (Addendum)
You can use imodium over the counter as needed We will contact you re: referral to Dr. Leone Payor Call our office if your symptoms improve after changing cholesterol medication

## 2012-09-11 NOTE — Progress Notes (Signed)
  Subjective:    Patient ID: Garrett [name], male    DOB: 12-02-1975, 36 y.o.   MRN: [id number]  HPI  36 year old white male for follow up regarding chronic intermittent loose stools. Patient's stool studies were normal. He was tried on empiric course of Cipro and Metronidazole. He had mild improvement but loose stools returned.  His symptoms appear to be worse with intake of fatty foods large meals.  Review of Systems Intermittent abdominal cramps that improves with bowel movement  Past Medical History  Diagnosis Date  . Allergy   . Depression   . GERD (gastroesophageal reflux disease)   . Acne     History   Social History  . Marital Status: Single    Spouse Name: N/A    Number of Children: N/A  . Years of Education: N/A   Occupational History  . Not on file.   Social History Main Topics  . Smoking status: Never Smoker   . Smokeless tobacco: Not on file  . Alcohol Use: Yes  . Drug Use:   . Sexually Active:    Other Topics Concern  . Not on file   Social History Narrative  . No narrative on file    No past surgical history on file.  Family History  Problem Relation Age of Onset  . Cancer Mother     cancer  . Hypertension Mother     No Known Allergies  Current Outpatient Prescriptions on File Prior to Visit  Medication Sig Dispense Refill  . azelastine (ASTELIN) 137 MCG/SPRAY nasal spray 1 spray by Nasal route 2 (two) times daily. Use in each nostril as directed       . budesonide (RHINOCORT AQUA) 32 MCG/ACT nasal spray 1 spray by Nasal route daily.        Marland Kitchen desloratadine (CLARINEX) 5 MG tablet Take 5 mg by mouth daily.        Marland Kitchen FLUoxetine (PROZAC) 20 MG capsule TAKE ONE CAPSULE BY MOUTH EVERY DAY  30 capsule  11  . NON FORMULARY Allergy shots weekly      . phenylephrine (SUDAFED PE) 10 MG TABS Take 10 mg by mouth every 4 (four) hours as needed.        . zaleplon (SONATA) 10 MG capsule Take 10 mg by mouth at bedtime as needed.      . rosuvastatin (CRESTOR)  10 MG tablet Take 1 tablet (10 mg total) by mouth daily.  30 tablet  0    BP 136/84  Pulse 96  Temp 98.6 F (37 C) (Oral)  Wt 180 lb (81.647 kg)  SpO2 99%       Objective:   Physical Exam  Constitutional: He appears well-developed and well-nourished.  Cardiovascular: Normal rate, regular rhythm and normal heart sounds.   No murmur heard. Pulmonary/Chest: Effort normal and breath sounds normal. He has no wheezes.  Abdominal: Soft. Bowel sounds are normal. There is no tenderness.  Skin: Skin is warm and dry. No rash noted.          Assessment & Plan:

## 2012-09-12 ENCOUNTER — Encounter: Payer: Self-pay | Admitting: Internal Medicine

## 2012-10-06 ENCOUNTER — Encounter: Payer: Self-pay | Admitting: Internal Medicine

## 2012-10-06 ENCOUNTER — Other Ambulatory Visit (INDEPENDENT_AMBULATORY_CARE_PROVIDER_SITE_OTHER): Payer: BC Managed Care – PPO

## 2012-10-06 ENCOUNTER — Ambulatory Visit (INDEPENDENT_AMBULATORY_CARE_PROVIDER_SITE_OTHER): Payer: BC Managed Care – PPO | Admitting: Internal Medicine

## 2012-10-06 VITALS — BP 124/84 | HR 88 | Ht 67.0 in | Wt 177.1 lb

## 2012-10-06 DIAGNOSIS — K529 Noninfective gastroenteritis and colitis, unspecified: Secondary | ICD-10-CM

## 2012-10-06 DIAGNOSIS — R634 Abnormal weight loss: Secondary | ICD-10-CM

## 2012-10-06 DIAGNOSIS — R197 Diarrhea, unspecified: Secondary | ICD-10-CM

## 2012-10-06 MED ORDER — MOVIPREP 100 G PO SOLR: ORAL | Status: DC

## 2012-10-06 NOTE — Patient Instructions (Addendum)
Your physician has requested that you go to the basement for the following lab work before leaving today: TTG, IGA  You have been scheduled for a colonoscopy with propofol. Please follow written instructions given to you at your visit today.  Please pick up your prep kit at the pharmacy within the next 1-3 days. If you use inhalers (even only as needed) or a CPAP machine, please bring them with you on the day of your procedure.   Thank you for choosing me and [facility].  Iva Boop, M.D., [facility]

## 2012-10-06 NOTE — Assessment & Plan Note (Signed)
2 months Infectious appears well ruled out

## 2012-10-06 NOTE — Progress Notes (Addendum)
  Subjective:    Patient ID: Garrett [name], male    DOB: 1976/04/22, 36 y.o.   MRN: [id number]  HPI This young white man is known to me from a remote evaluation of rectal bleeding caused by hemorrhoids. He has had 2 months + of diarrhea. Some mild abdominal  Cramping. Has 2-3 loose to watery stools a day. Work-up for infection (O and P, C diff, culture) all negative. Mild rectal bleeding at times. There is weight loss. Does not seem to be nocturnal. Changing simvistatin to Crestor did not help. He has eliminated alcohol and coffee and does nor drink much milk. No changes. No travel or antibiotics, other med changes. + anorexia, bloating and gas. Cipro and metronidazole trial slightly helpful. Loperamide relieves problems for about 1 day (2 tabs).  Medications, allergies, past medical history, past surgical history, family history and social history are reviewed and updated in the EMR.   Review of Systems +fatigue, myalgia. All other ROS negative or as per HPI    Objective:   Physical Exam General:  Well-developed, well-nourished and in no acute distress - overweight Eyes:  anicteric. ENT:   Mouth and posterior pharynx free of lesions.  Neck:   supple w/o thyromegaly or mass.  Lungs: Clear to auscultation bilaterally. Heart:  S1S2, no rubs, murmurs, gallops. Abdomen:  soft, non-tender, no hepatosplenomegaly, hernia, or mass and BS+.  Rectal: Deferred until colonoscopy Lymph:  no cervical or supraclavicular adenopathy. Extremities:   no edema Skin   no rash. Neuro:  A&O x 3.  Psych:  appropriate mood and  Affect.   Data Reviewed: Wt Readings from Last 3 Encounters:  10/06/12 177 lb 2 oz (80.343 kg)  09/11/12 180 lb (81.647 kg)  08/14/12 182 lb (82.555 kg)    blood tests and stool studies in EMR PCP notes    Assessment & Plan:   1. Chronic diarrhea - usual infections ruled out  2. Weight loss, unintentional    1. Check TTG Ab and IgA level to screen for celiac  disease 2. Schedule colonoscopy with biopsies and hopeful terminal ileal intubation. ? Inflammatory process vs. Functional. Weight loss goes against functional but if underlying stressors could be cause of weight loss. Need to look for possible organic causes. The risks and benefits as well as alternatives of endoscopic procedure(s) have been discussed and reviewed. All questions answered. The patient agrees to proceed.  Cc: Alfredo Batty, DO

## 2012-10-09 LAB — TISSUE TRANSGLUTAMINASE, IGA: Tissue Transglutaminase Ab, IgA: 5.8 U/mL (ref <20)

## 2012-10-16 ENCOUNTER — Encounter: Payer: Self-pay | Admitting: Internal Medicine

## 2012-10-16 ENCOUNTER — Ambulatory Visit (AMBULATORY_SURGERY_CENTER): Payer: BC Managed Care – PPO | Admitting: Internal Medicine

## 2012-10-16 VITALS — BP 118/67 | HR 72 | Temp 97.5°F | Resp 13 | Ht 67.0 in | Wt 177.0 lb

## 2012-10-16 DIAGNOSIS — R197 Diarrhea, unspecified: Secondary | ICD-10-CM

## 2012-10-16 DIAGNOSIS — D126 Benign neoplasm of colon, unspecified: Secondary | ICD-10-CM

## 2012-10-16 DIAGNOSIS — R634 Abnormal weight loss: Secondary | ICD-10-CM

## 2012-10-16 HISTORY — PX: COLONOSCOPY: SHX5424

## 2012-10-16 MED ORDER — SODIUM CHLORIDE 0.9 % IV SOLN: 500.0000 mL | INTRAVENOUS | Status: DC

## 2012-10-16 MED ORDER — DICYCLOMINE HCL 20 MG PO TABS: 20.0000 mg | ORAL_TABLET | Freq: Three times a day (TID) | ORAL | Status: DC

## 2012-10-16 NOTE — Patient Instructions (Addendum)
The colon looked normal. I took biopsies to see if there is microscopic colitis and will let you know.  Use the low fiber diet and start dicyclomine, ok to use Imodium also.  Will call with results and plans.  Thank you for choosing me and  [facility].  Iva Boop, MD, FACG  YOU HAD AN ENDOSCOPIC PROCEDURE TODAY AT THE [facility]: Refer to the procedure report that was given to you for any specific questions about what was found during the examination.  If the procedure report does not answer your questions, please call your gastroenterologist to clarify.  If you requested that your care partner not be given the details of your procedure findings, then the procedure report has been included in a sealed envelope for you to review at your convenience later.  YOU SHOULD EXPECT: Some feelings of bloating in the abdomen. Passage of more gas than usual.  Walking can help get rid of the air that was put into your GI tract during the procedure and reduce the bloating. If you had a lower endoscopy (such as a colonoscopy or flexible sigmoidoscopy) you may notice spotting of blood in your stool or on the toilet paper. If you underwent a bowel prep for your procedure, then you may not have a normal bowel movement for a few days.  DIET: Your first meal following the procedure should be a light meal and then it is ok to progress to your normal diet.  A half-sandwich or bowl of soup is an example of a good first meal.  Heavy or fried foods are harder to digest and may make you feel nauseous or bloated.  Likewise meals heavy in dairy and vegetables can cause extra gas to form and this can also increase the bloating.  Drink plenty of fluids but you should avoid alcoholic beverages for 24 hours.  ACTIVITY: Your care partner should take you home directly after the procedure.  You should plan to take it easy, moving slowly for the rest of the day.  You can resume normal activity the day  after the procedure however you should NOT DRIVE or use heavy machinery for 24 hours (because of the sedation medicines used during the test).    SYMPTOMS TO REPORT IMMEDIATELY: A gastroenterologist can be reached at any hour.  During normal business hours, 8:30 AM to 5:00 PM Monday through Friday, call [phone].  After hours and on weekends, please call the GI answering service at [phone] who will take a message and have the physician on call contact you.   Following lower endoscopy (colonoscopy or flexible sigmoidoscopy):  Excessive amounts of blood in the stool  Significant tenderness or worsening of abdominal pains  Swelling of the abdomen that is new, acute  Fever of 100F or higher FOLLOW UP: If any biopsies were taken you will be contacted by phone or by letter within the next 1-3 weeks.  Call your gastroenterologist if you have not heard about the biopsies in 3 weeks.  Our staff will call the home number listed on your records the next business day following your procedure to check on you and address any questions or concerns that you may have at that time regarding the information given to you following your procedure. This is a courtesy call and so if there is no answer at the home number and we have not heard from you through the emergency physician on call, we will assume that you have returned to  your regular daily activities without incident.  SIGNATURES/CONFIDENTIALITY: You and/or your care partner have signed paperwork which will be entered into your electronic medical record.  These signatures attest to the fact that that the information above on your After Visit Summary has been reviewed and is understood.  Full responsibility of the confidentiality of this discharge information lies with you and/or your care-partner.

## 2012-10-16 NOTE — Op Note (Signed)
[facility] [address] [place], [address]   COLONOSCOPY PROCEDURE REPORT  PATIENT: Garrett [name], Garrett [name]  MR#: [id number] BIRTHDATE: January 19, 1976 , 36  yrs. old GENDER: Male ENDOSCOPIST: Iva Boop, MD, [facility] PROCEDURE DATE:  10/16/2012 PROCEDURE:   Colonoscopy with biopsy ASA CLASS:   Class II INDICATIONS:chronic diarrhea and unexplained diarrhea. MEDICATIONS: Propofol (Diprivan) 230 mg IV, MAC sedation, administered by CRNA, and These medications were titrated to patient response per physician's verbal order  DESCRIPTION OF PROCEDURE:   After the risks benefits and alternatives of the procedure were thoroughly explained, informed consent was obtained.  A digital rectal exam revealed no abnormalities of the rectum and A digital rectal exam revealed the prostate was not enlarged.   The LB [id number]  endoscope was introduced through the anus and advanced to the terminal ileum which was intubated for a short distance. No adverse events experienced.   The quality of the prep was Suprep excellent  The instrument was then slowly withdrawn as the colon was fully examined.      COLON FINDINGS: The mucosa appeared normal in the terminal ileum. The colonic mucosa appeared normal throughout the entire examined colon.  Multiple random biopsies of the area were performed. Retroflexed views revealed no abnormalities. The time to cecum=1 minutes 21 seconds.  Withdrawal time=8 minutes 43 seconds.  The scope was withdrawn and the procedure completed. COMPLICATIONS: There were no complications.  ENDOSCOPIC IMPRESSION: 1.   Normal mucosa in the terminal ileum 2.   The colonic mucosa appeared normal throughout the entire examined colon; multiple random biopsies of the area were performed   RECOMMENDATIONS: Office will call with the results and plans   eSigned:  Iva Boop, MD, [facility] 10/16/2012 4:27 PM   cc: Thomos Lemons, DO and The Patient

## 2012-10-16 NOTE — Progress Notes (Signed)
Patient did not experience any of the following events: a burn prior to discharge; a fall within the facility; wrong site/side/patient/procedure/implant event; or a hospital transfer or hospital admission upon discharge from the facility. (G8907) Patient did not have preoperative order for IV antibiotic SSI prophylaxis. (G8918)  

## 2012-10-17 ENCOUNTER — Telehealth: Payer: Self-pay | Admitting: *Deleted

## 2012-10-17 NOTE — Telephone Encounter (Signed)
Left message, number identifier, call to follow-up

## 2012-10-24 ENCOUNTER — Other Ambulatory Visit: Payer: Self-pay | Admitting: Internal Medicine

## 2012-10-24 MED ORDER — COLESTIPOL HCL 1 G PO TABS: 2.0000 g | ORAL_TABLET | Freq: Two times a day (BID) | ORAL | Status: DC

## 2012-10-25 ENCOUNTER — Telehealth: Payer: Self-pay | Admitting: Internal Medicine

## 2012-10-25 NOTE — Telephone Encounter (Signed)
Patient advised of path, see results on 10/16/12 for details

## 2012-10-25 NOTE — Progress Notes (Signed)
Needs to take Crestor 1 hour before or 4 hours after colestipol so absorption not impaired

## 2012-11-10 ENCOUNTER — Ambulatory Visit (INDEPENDENT_AMBULATORY_CARE_PROVIDER_SITE_OTHER): Payer: BC Managed Care – PPO | Admitting: Internal Medicine

## 2012-11-10 ENCOUNTER — Encounter: Payer: Self-pay | Admitting: Internal Medicine

## 2012-11-10 VITALS — BP 138/92 | Temp 98.7°F | Wt 177.0 lb

## 2012-11-10 DIAGNOSIS — F329 Major depressive disorder, single episode, unspecified: Secondary | ICD-10-CM

## 2012-11-10 DIAGNOSIS — K589 Irritable bowel syndrome without diarrhea: Secondary | ICD-10-CM

## 2012-11-10 NOTE — Progress Notes (Signed)
Subjective:    Patient ID: Garrett [name], male    DOB: 09-08-1976, 36 y.o.   MRN: [id number]  HPI  36 year old white male for followup regarding chronic diarrhea. He was seen by Dr. Leone Payor and underwent colonoscopy. Multiple biopsies were unrevealing. Antibodies for celiac sprue were negative. Patient reports diarrhea has improved with starting colestipol. He is using medication once to twice daily. Abdominal cramping has also improved. Patient diagnosed with presumed IBS. He has tried avoiding certain foods such as fresh salads.  Heavy protein or fatty meals also seemed to exacerbate his symptoms.  Review of Systems   negative for weight loss  Past Medical History  Diagnosis Date  . Allergy   . Depression   . GERD (gastroesophageal reflux disease)   . Acne   . External hemorrhoids   . HLD (hyperlipidemia)   . Anxiety   . Strabismus     History   Social History  . Marital Status: Single    Spouse Name: N/A    Number of Children: 0  . Years of Education: N/A   Occupational History  . day trader    Social History Main Topics  . Smoking status: Never Smoker   . Smokeless tobacco: Never Used  . Alcohol Use: Yes     Comment: less than per day  . Drug Use: No  . Sexually Active: Not on file   Other Topics Concern  . Not on file   Social History Narrative  . No narrative on file    Past Surgical History  Procedure Date  . Strabismus surgery 2001, 2010    X 2  . Wisdom tooth extraction     Family History  Problem Relation Age of Onset  . Breast cancer Mother   . Hypertension Mother   . Hyperlipidemia Mother   . Prostate cancer Father   . Ulcerative colitis Father   . Hypertension Father   . Hyperlipidemia Father   . Colon cancer Neg Hx   . Rectal cancer Neg Hx   . Stomach cancer Neg Hx   . Esophageal cancer Neg Hx     No Known Allergies  Current Outpatient Prescriptions on File Prior to Visit  Medication Sig Dispense Refill  . Alpha-D-Galactosidase  (BEANO PO) Take by mouth as needed.      Marland Kitchen azelastine (ASTELIN) 137 MCG/SPRAY nasal spray 1 spray by Nasal route 2 (two) times daily. Use in each nostril as directed       . budesonide (RHINOCORT AQUA) 32 MCG/ACT nasal spray 1 spray by Nasal route daily.        . Calcium Carbonate Antacid (TUMS PO) Take by mouth as needed.      . colestipol (COLESTID) 1 G tablet Take 2 tablets (2 g total) by mouth 2 (two) times daily with a meal.  120 tablet  1  . desloratadine (CLARINEX) 5 MG tablet Take 5 mg by mouth daily.        Marland Kitchen dicyclomine (BENTYL) 20 MG tablet Take 1 tablet (20 mg total) by mouth 3 (three) times daily before meals.  90 tablet  0  . FLUoxetine (PROZAC) 20 MG capsule TAKE ONE CAPSULE BY MOUTH EVERY DAY  30 capsule  11  . Loperamide HCl (IMODIUM A-D PO) Take by mouth as needed.      . NON FORMULARY Allergy shots weekly      . phenylephrine (SUDAFED PE) 10 MG TABS Take 10 mg by mouth every 4 (four) hours as needed.        Marland Kitchen  simvastatin (ZOCOR) 20 MG tablet Take 10 mg by mouth every evening.      . zaleplon (SONATA) 10 MG capsule Take 10 mg by mouth at bedtime as needed.        BP 138/92  Temp 98.7 F (37.1 C) (Oral)  Wt 177 lb (80.287 kg)    Objective:   Physical Exam  Constitutional: He appears well-developed and well-nourished.  HENT:  Head: Normocephalic and atraumatic.  Cardiovascular: Normal rate, regular rhythm and normal heart sounds.   Pulmonary/Chest: Effort normal and breath sounds normal. He has no wheezes.  Abdominal: Soft. Bowel sounds are normal.  Skin: Skin is warm and dry.          Assessment & Plan:

## 2012-11-10 NOTE — Assessment & Plan Note (Signed)
Infectious etiology ruled out. Colonoscopy was unremarkable. Celiac sprue panel was negative. Symptoms presumed secondary to IBS and responding nicely to colestipol twice daily. We discussed possibly using probiotic supplement daily. Patient also to try different dietary restrictions to help control his symptoms.

## 2012-11-10 NOTE — Assessment & Plan Note (Signed)
It is unclear whether his fluoxetine is contributing to his loose stools. Patient to try taking lower dose to see if it improves his diarrhea.

## 2012-11-29 ENCOUNTER — Ambulatory Visit (INDEPENDENT_AMBULATORY_CARE_PROVIDER_SITE_OTHER): Payer: BC Managed Care – PPO | Admitting: Internal Medicine

## 2012-11-29 ENCOUNTER — Encounter: Payer: Self-pay | Admitting: Internal Medicine

## 2012-11-29 VITALS — BP 134/66 | HR 75 | Ht 67.0 in | Wt 180.0 lb

## 2012-11-29 DIAGNOSIS — K219 Gastro-esophageal reflux disease without esophagitis: Secondary | ICD-10-CM | POA: Insufficient documentation

## 2012-11-29 DIAGNOSIS — K589 Irritable bowel syndrome without diarrhea: Secondary | ICD-10-CM

## 2012-11-29 MED ORDER — OMEPRAZOLE 20 MG PO CPDR: 20.0000 mg | DELAYED_RELEASE_CAPSULE | Freq: Every day | ORAL | Status: DC

## 2012-11-29 NOTE — Patient Instructions (Addendum)
We have sent the following medications to your pharmacy for you to pick up at your convenience: Omeprazole.  Thank you for choosing [facility] for your health care needs!

## 2012-11-29 NOTE — Progress Notes (Signed)
  Subjective:    Patient ID: Garrett [name], male    DOB: 1975/12/17, 37 y.o.   MRN: [id number]  HPI The patient returns reporting that his diarrhea is much better since starting colestipol. He had a colonoscopy with negative random biopsies in working diagnosis is IBS. Since starting the colestipol as had more heartburn in nausea symptoms. He tried some over-the-counter Prilosec and that did help but because it talks about not taking it for longer than 14 days he decided to stop it. So he does have upper abdominal burning and similar pain radiating to his chest at times. He thinks it's similar to symptoms he took Nexium for a number of years ago. At that time he eventually lost weight and did lifestyle changes and felt better.  Medications, allergies, past medical history, past surgical history, family history and social history are reviewed and updated in the EMR.  Review of Systems As above    Objective:   Physical Exam Well-developed nourished no acute distress       Assessment & Plan:   1. IBS (irritable bowel syndrome) improved using colestipol to control diarrhea symptoms he is content with this situation and quality of life   2. GERD (gastroesophageal reflux disease) this seems to BE flaring, past and if this is related to the colestipol versus other. He says rather than stopping the colestipol he would like to try a PPI medication since that has worked in the past.    1. Start omeprazole 20 mg daily #30 with 11 refills 2. Continue lifestyle changes that he is already limited caffeine 3. 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 Return visit in 1 year routinely sooner as needed 5. Okay refill medications in the interim

## 2012-12-04 ENCOUNTER — Other Ambulatory Visit: Payer: Self-pay | Admitting: Internal Medicine

## 2012-12-23 ENCOUNTER — Other Ambulatory Visit: Payer: Self-pay | Admitting: Internal Medicine

## 2013-03-15 ENCOUNTER — Encounter: Payer: Self-pay | Admitting: Family Medicine

## 2013-03-15 ENCOUNTER — Ambulatory Visit (INDEPENDENT_AMBULATORY_CARE_PROVIDER_SITE_OTHER): Payer: BC Managed Care – PPO | Admitting: Family Medicine

## 2013-03-15 VITALS — BP 130/80 | Temp 98.5°F | Wt 181.0 lb

## 2013-03-15 DIAGNOSIS — R1030 Lower abdominal pain, unspecified: Secondary | ICD-10-CM

## 2013-03-15 DIAGNOSIS — R109 Unspecified abdominal pain: Secondary | ICD-10-CM

## 2013-03-15 DIAGNOSIS — R300 Dysuria: Secondary | ICD-10-CM

## 2013-03-15 LAB — CBC WITH DIFFERENTIAL/PLATELET
Basophils Absolute: 0 10*3/uL (ref 0.0–0.1)
HCT: 42 % (ref 39.0–52.0)
Lymphocytes Relative: 29.3 % (ref 12.0–46.0)
Lymphs Abs: 1.7 10*3/uL (ref 0.7–4.0)
Monocytes Relative: 7.3 % (ref 3.0–12.0)
Platelets: 236 10*3/uL (ref 150.0–400.0)
RDW: 12.1 % (ref 11.5–14.6)

## 2013-03-15 LAB — POCT URINALYSIS DIPSTICK
Leukocytes, UA: NEGATIVE
Nitrite, UA: NEGATIVE
Protein, UA: NEGATIVE
Urobilinogen, UA: 0.2
pH, UA: 6

## 2013-03-15 NOTE — Progress Notes (Signed)
  Subjective:    Patient ID: Garrett [name], male    DOB: August 22, 1976, 37 y.o.   MRN: [id number]  HPI Acute visit. Patient presents with suprapubic abdominal pain for about 3 days. Has noted some urine frequency and urgency. No pyuria. No fevers or chills. No nausea or vomiting. History of IBS but these symptoms are different. No recent stool changes. Appetite okay. No weight loss. No known injury. Advil helps slightly. Symptoms relatively mild. Achy pain. No exacerbating factors.   Past Medical History  Diagnosis Date  . Allergy   . Depression   . GERD (gastroesophageal reflux disease)   . Acne   . External hemorrhoids   . HLD (hyperlipidemia)   . Anxiety   . Strabismus   . Irritable bowel syndrome    Past Surgical History  Procedure Laterality Date  . Strabismus surgery  2001, 2010    X 2  . Wisdom tooth extraction    . Flexible sigmoidoscopy    . Colonoscopy  10/16/12    reports that he has never smoked. He has never used smokeless tobacco. He reports that  drinks alcohol. He reports that he does not use illicit drugs. family history includes Breast cancer in his mother; Hyperlipidemia in his father and mother; Hypertension in his father and mother; Prostate cancer in his father; and Ulcerative colitis in his father.  There is no history of Colon cancer, and Rectal cancer, and Stomach cancer, and Esophageal cancer, . No Known Allergies    Review of Systems  Constitutional: Negative for fever, chills, appetite change and unexpected weight change.  Gastrointestinal: Positive for abdominal pain. Negative for nausea, vomiting, diarrhea, constipation, blood in stool and abdominal distention.  Genitourinary: Positive for dysuria. Negative for hematuria.       Objective:   Physical Exam  Constitutional: He appears well-developed and well-nourished.  Cardiovascular: Normal rate and regular rhythm.   Pulmonary/Chest: Effort normal and breath sounds normal. No respiratory  distress. He has no wheezes. He has no rales.  Abdominal: Soft. Bowel sounds are normal. He exhibits no distension and no mass. There is no rebound and no guarding.  Very minimal suprapubic tenderness. No guarding or rebound  Genitourinary:  No testicle mass or tenderness.  No inguinal hernia.  Skin: No rash noted.          Assessment & Plan:  Suprapubic abdominal pain. Urinalysis unremarkable. Question musculoskeletal. Check CBC.

## 2013-03-15 NOTE — Patient Instructions (Addendum)
Follow up promptly for any fever, vomiting, or worsening pain. Continue Advil as needed .

## 2013-03-16 NOTE — Progress Notes (Signed)
Quick Note:  Pt informed on personally identified VM ______ 

## 2013-03-26 ENCOUNTER — Other Ambulatory Visit: Payer: Self-pay | Admitting: Internal Medicine

## 2013-04-23 ENCOUNTER — Other Ambulatory Visit: Payer: Self-pay | Admitting: Internal Medicine

## 2013-05-11 ENCOUNTER — Ambulatory Visit (INDEPENDENT_AMBULATORY_CARE_PROVIDER_SITE_OTHER): Payer: BC Managed Care – PPO | Admitting: Internal Medicine

## 2013-05-11 ENCOUNTER — Encounter: Payer: Self-pay | Admitting: Internal Medicine

## 2013-05-11 VITALS — BP 146/94 | HR 96 | Temp 99.2°F | Wt 182.0 lb

## 2013-05-11 DIAGNOSIS — E785 Hyperlipidemia, unspecified: Secondary | ICD-10-CM

## 2013-05-11 DIAGNOSIS — F329 Major depressive disorder, single episode, unspecified: Secondary | ICD-10-CM

## 2013-05-11 MED ORDER — PRAVASTATIN SODIUM 40 MG PO TABS: 40.0000 mg | ORAL_TABLET | Freq: Every day | ORAL | Status: DC

## 2013-05-11 MED ORDER — CITALOPRAM HYDROBROMIDE 20 MG PO TABS: 20.0000 mg | ORAL_TABLET | Freq: Every day | ORAL | Status: DC

## 2013-05-11 NOTE — Assessment & Plan Note (Signed)
We discussed possibility of statins exacerbating his gastrointestinal symptoms. Switch from simvastatin to pravastatin.

## 2013-05-11 NOTE — Progress Notes (Signed)
Subjective:    Patient ID: Garrett [name], male    DOB: December 15, 1975, 37 y.o.   MRN: [id number]  HPI  37 year old white male for followup regarding IBS. He also has history of adjustment disorder with anxiety and hyperlipidemia. Patient reports his IBS symptoms are stable. He has been on fluoxetine 20 mg for over 10 years. Patient reports occasional breakthrough symptoms despite using fluoxetine. He was previously on Paxil but discontinued secondary to sexual dysfunction.  He is taking simvastatin 20 mg once daily for prevention. He does not recall his initial lipid panel for years. He does not have family history of premature coronary artery disease or stroke.  Review of Systems Negative for chest pain or shortness of breath    Past Medical History  Diagnosis Date  . Allergy   . Depression   . GERD (gastroesophageal reflux disease)   . Acne   . External hemorrhoids   . HLD (hyperlipidemia)   . Anxiety   . Strabismus   . Irritable bowel syndrome     History   Social History  . Marital Status: Single    Spouse Name: N/A    Number of Children: 0  . Years of Education: N/A   Occupational History  . day trader    Social History Main Topics  . Smoking status: Never Smoker   . Smokeless tobacco: Never Used  . Alcohol Use: Yes     Comment: less than per day  . Drug Use: No  . Sexually Active: Not on file   Other Topics Concern  . Not on file   Social History Narrative  . No narrative on file    Past Surgical History  Procedure Laterality Date  . Strabismus surgery  2001, 2010    X 2  . Wisdom tooth extraction    . Flexible sigmoidoscopy    . Colonoscopy  10/16/12    Family History  Problem Relation Age of Onset  . Breast cancer Mother   . Hypertension Mother   . Hyperlipidemia Mother   . Prostate cancer Father   . Ulcerative colitis Father   . Hypertension Father   . Hyperlipidemia Father   . Colon cancer Neg Hx   . Rectal cancer Neg Hx   . Stomach cancer  Neg Hx   . Esophageal cancer Neg Hx     No Known Allergies  Current Outpatient Prescriptions on File Prior to Visit  Medication Sig Dispense Refill  . Alpha-D-Galactosidase (BEANO PO) Take by mouth as needed.      Marland Kitchen azelastine (ASTELIN) 137 MCG/SPRAY nasal spray 1 spray by Nasal route 2 (two) times daily. Use in each nostril as directed       . budesonide (RHINOCORT AQUA) 32 MCG/ACT nasal spray 1 spray by Nasal route daily.        . Calcium Carbonate Antacid (TUMS PO) Take by mouth as needed.      . desloratadine (CLARINEX) 5 MG tablet Take 5 mg by mouth daily.        Marland Kitchen dicyclomine (BENTYL) 20 MG tablet Take 1 tablet (20 mg total) by mouth 3 (three) times daily before meals.  90 tablet  0  . FLUoxetine (PROZAC) 20 MG capsule TAKE ONE CAPSULE BY MOUTH ONCE DAILY NEEDS OFFICE VISIT  30 capsule  5  . Loperamide HCl (IMODIUM A-D PO) Take by mouth as needed.      Marland Kitchen MICRONIZED COLESTIPOL HCL 1 G tablet TAKE 2 TABLETS (2 G TOTAL) BY MOUTH  2 (TWO) TIMES DAILY WITH A MEAL.  120 tablet  5  . NON FORMULARY Allergy shots weekly      . omeprazole (PRILOSEC) 20 MG capsule Take 1 capsule (20 mg total) by mouth daily before breakfast. 30-60 minutes before  30 capsule  11  . phenylephrine (SUDAFED PE) 10 MG TABS Take 10 mg by mouth every 4 (four) hours as needed.        . simvastatin (ZOCOR) 40 MG tablet Take 20 mg by mouth every evening.      . zaleplon (SONATA) 10 MG capsule Take 10 mg by mouth at bedtime as needed.       No current facility-administered medications on file prior to visit.    BP 146/94  Pulse 96  Temp(Src) 99.2 F (37.3 C) (Oral)  Wt 182 lb (82.555 kg)  BMI 28.5 kg/m2    Objective:   Physical Exam  Constitutional: He is oriented to person, place, and time. He appears well-developed and well-nourished.  Cardiovascular: Normal rate, regular rhythm and normal heart sounds.   No murmur heard. Pulmonary/Chest: Effort normal and breath sounds normal. He has no wheezes.   Neurological: He is alert and oriented to person, place, and time. No cranial nerve deficit.  Psychiatric: He has a normal mood and affect. His behavior is normal.          Assessment & Plan:

## 2013-05-11 NOTE — Patient Instructions (Addendum)
Please complete the following lab tests before your next follow up appointment: FLP, LFTs, BMET - 272.4

## 2013-05-11 NOTE — Assessment & Plan Note (Signed)
Patient's anxiety symptoms are not completely controlled on fluoxetine. Fluoxetine also has multiple potential drug interactions. Switch to citalopram 20 mg once daily.

## 2013-06-17 ENCOUNTER — Other Ambulatory Visit: Payer: Self-pay | Admitting: Internal Medicine

## 2013-08-02 ENCOUNTER — Other Ambulatory Visit (INDEPENDENT_AMBULATORY_CARE_PROVIDER_SITE_OTHER): Payer: BC Managed Care – PPO

## 2013-08-02 DIAGNOSIS — E785 Hyperlipidemia, unspecified: Secondary | ICD-10-CM

## 2013-08-02 LAB — BASIC METABOLIC PANEL
BUN: 7 mg/dL (ref 6–23)
GFR: 116.88 mL/min (ref >60.00)
Glucose, Bld: 82 mg/dL (ref 70–99)
Potassium: 3.6 mEq/L (ref 3.5–5.1)

## 2013-08-02 LAB — HEPATIC FUNCTION PANEL
ALT: 18 U/L (ref 0–53)
AST: 22 U/L (ref 0–37)
Albumin: 4.7 g/dL (ref 3.5–5.2)
Total Protein: 7.5 g/dL (ref 6.0–8.3)

## 2013-08-02 LAB — LIPID PANEL
Cholesterol: 129 mg/dL (ref 0–200)
VLDL: 38.4 mg/dL (ref 0.0–40.0)

## 2013-08-09 ENCOUNTER — Encounter: Payer: Self-pay | Admitting: Internal Medicine

## 2013-08-09 ENCOUNTER — Ambulatory Visit (INDEPENDENT_AMBULATORY_CARE_PROVIDER_SITE_OTHER): Payer: BC Managed Care – PPO | Admitting: Internal Medicine

## 2013-08-09 VITALS — BP 148/98 | Temp 99.4°F | Wt 184.0 lb

## 2013-08-09 DIAGNOSIS — E785 Hyperlipidemia, unspecified: Secondary | ICD-10-CM

## 2013-08-09 DIAGNOSIS — Z23 Encounter for immunization: Secondary | ICD-10-CM

## 2013-08-09 DIAGNOSIS — F329 Major depressive disorder, single episode, unspecified: Secondary | ICD-10-CM

## 2013-08-09 MED ORDER — PRAVASTATIN SODIUM 40 MG PO TABS: 40.0000 mg | ORAL_TABLET | Freq: Every day | ORAL | Status: DC

## 2013-08-09 MED ORDER — CITALOPRAM HYDROBROMIDE 20 MG PO TABS: 20.0000 mg | ORAL_TABLET | Freq: Every day | ORAL | Status: DC

## 2013-08-09 NOTE — Patient Instructions (Signed)
Start aerobic exercise program Monitor your blood pressure as directed.  Follow up if your systolic blood pressure is consistently greater than 140.

## 2013-08-09 NOTE — Assessment & Plan Note (Signed)
Stable on citalopram.  Continue same dose.

## 2013-08-09 NOTE — Progress Notes (Signed)
Subjective:    Patient ID: Garrett [name], male    DOB: [birth year], 37 y.o.   MRN: [id number]  HPI  37 year old white male for followup regarding IBS and anxiety. Patient doing well on citalopram. He denies any side effects.  Hyperlipidemia-patient tolerating pravastatin without any difficulty. We reviewed his recent blood work. Lipids are well controlled. Liver function tests are stable.  His blood pressures sporadically elevated today. He has history of elevated blood pressure without diagnosis of hypertension in the past.   Review of Systems 1-2 cups of coffee per day  Past Medical History  Diagnosis Date  . Allergy   . Depression   . GERD (gastroesophageal reflux disease)   . Acne   . External hemorrhoids   . HLD (hyperlipidemia)   . Anxiety   . Strabismus   . Irritable bowel syndrome     History   Social History  . Marital Status: Single    Spouse Name: N/A    Number of Children: 0  . Years of Education: N/A   Occupational History  . day trader    Social History Main Topics  . Smoking status: Never Smoker   . Smokeless tobacco: Never Used  . Alcohol Use: Yes     Comment: less than per day  . Drug Use: No  . Sexual Activity: Not on file   Other Topics Concern  . Not on file   Social History Narrative  . No narrative on file    Past Surgical History  Procedure Laterality Date  . Strabismus surgery  2001, 2010    X 2  . Wisdom tooth extraction    . Flexible sigmoidoscopy    . Colonoscopy  10/16/12    Family History  Problem Relation Age of Onset  . Breast cancer Mother   . Hypertension Mother   . Hyperlipidemia Mother   . Prostate cancer Father   . Ulcerative colitis Father   . Hypertension Father   . Hyperlipidemia Father   . Colon cancer Neg Hx   . Rectal cancer Neg Hx   . Stomach cancer Neg Hx   . Esophageal cancer Neg Hx     No Known Allergies  Current Outpatient Prescriptions on File Prior to Visit  Medication Sig Dispense Refill   . Alpha-D-Galactosidase (BEANO PO) Take by mouth as needed.      Marland Kitchen azelastine (ASTELIN) 137 MCG/SPRAY nasal spray 1 spray by Nasal route 2 (two) times daily. Use in each nostril as directed       . budesonide (RHINOCORT AQUA) 32 MCG/ACT nasal spray 1 spray by Nasal route daily.        . Calcium Carbonate Antacid (TUMS PO) Take by mouth as needed.      . desloratadine (CLARINEX) 5 MG tablet Take 5 mg by mouth daily.        Marland Kitchen dicyclomine (BENTYL) 20 MG tablet Take 1 tablet (20 mg total) by mouth 3 (three) times daily before meals.  90 tablet  0  . Loperamide HCl (IMODIUM A-D PO) Take by mouth as needed.      Marland Kitchen MICRONIZED COLESTIPOL HCL 1 G tablet TAKE 2 TABLETS (2 G TOTAL) BY MOUTH 2 (TWO) TIMES DAILY WITH A MEAL.  120 tablet  5  . NON FORMULARY Allergy shots weekly      . omeprazole (PRILOSEC) 20 MG capsule Take 1 capsule (20 mg total) by mouth daily before breakfast. 30-60 minutes before  30 capsule  11  . phenylephrine (  SUDAFED PE) 10 MG TABS Take 10 mg by mouth every 4 (four) hours as needed.        . zaleplon (SONATA) 10 MG capsule Take 10 mg by mouth at bedtime as needed.       No current facility-administered medications on file prior to visit.    BP 148/98  Temp(Src) 99.4 F (37.4 C) (Oral)  Wt 184 lb (83.462 kg)  BMI 28.81 kg/m2       Objective:   Physical Exam  Constitutional: He is oriented to person, place, and time. He appears well-developed and well-nourished.  Cardiovascular: Normal rate, regular rhythm and normal heart sounds.   No murmur heard. Pulmonary/Chest: Breath sounds normal. He has no wheezes.  Musculoskeletal: He exhibits no edema.  Neurological: He is alert and oriented to person, place, and time. No cranial nerve deficit.  Psychiatric: He has a normal mood and affect. His behavior is normal.          Assessment & Plan:

## 2013-08-09 NOTE — Assessment & Plan Note (Signed)
Tolerating pravastatin.  Lipids well controlled.  Continue same dose. Lab Results  Component Value Date   CHOL 129 08/02/2013   HDL 39.80 08/02/2013   LDLCALC 51 08/02/2013   LDLDIRECT 122.9 04/18/2008   TRIG 192.0* 08/02/2013   CHOLHDL 3 08/02/2013   Lab Results  Component Value Date   ALT 18 08/02/2013   AST 22 08/02/2013   ALKPHOS 57 08/02/2013   BILITOT 1.1 08/02/2013

## 2013-08-19 ENCOUNTER — Other Ambulatory Visit: Payer: Self-pay | Admitting: Internal Medicine

## 2013-09-24 ENCOUNTER — Other Ambulatory Visit: Payer: Self-pay | Admitting: Internal Medicine

## 2013-10-20 ENCOUNTER — Other Ambulatory Visit: Payer: Self-pay | Admitting: Internal Medicine

## 2013-11-20 ENCOUNTER — Other Ambulatory Visit: Payer: Self-pay | Admitting: Internal Medicine

## 2013-12-16 ENCOUNTER — Other Ambulatory Visit: Payer: Self-pay | Admitting: Internal Medicine

## 2013-12-24 ENCOUNTER — Encounter: Payer: Self-pay | Admitting: Internal Medicine

## 2014-01-14 ENCOUNTER — Other Ambulatory Visit: Payer: Self-pay | Admitting: Internal Medicine

## 2014-02-04 ENCOUNTER — Ambulatory Visit: Payer: BC Managed Care – PPO | Admitting: Internal Medicine

## 2014-02-06 ENCOUNTER — Ambulatory Visit: Payer: BC Managed Care – PPO | Admitting: Internal Medicine

## 2014-02-07 ENCOUNTER — Ambulatory Visit: Payer: BC Managed Care – PPO | Admitting: Internal Medicine

## 2014-02-16 ENCOUNTER — Other Ambulatory Visit: Payer: Self-pay | Admitting: Internal Medicine

## 2014-02-19 ENCOUNTER — Ambulatory Visit (INDEPENDENT_AMBULATORY_CARE_PROVIDER_SITE_OTHER): Payer: BC Managed Care – PPO | Admitting: Internal Medicine

## 2014-02-19 ENCOUNTER — Encounter: Payer: Self-pay | Admitting: Internal Medicine

## 2014-02-19 VITALS — BP 166/102 | HR 88 | Temp 98.4°F | Ht 67.0 in | Wt 187.0 lb

## 2014-02-19 DIAGNOSIS — I10 Essential (primary) hypertension: Secondary | ICD-10-CM

## 2014-02-19 MED ORDER — LISINOPRIL 20 MG PO TABS: 20.0000 mg | ORAL_TABLET | Freq: Every day | ORAL | Status: DC

## 2014-02-19 NOTE — Progress Notes (Signed)
Pre visit review using our clinic review tool, if applicable. No additional management support is needed unless otherwise documented below in the visit note. 

## 2014-02-19 NOTE — Progress Notes (Signed)
Home BPs average 148/93. Typically lower in the afternoon Patient denies any chest pain, shortness of breath. He denies any lightheadedness.  Reviewed past medical history, medications.  Review of systems: No specific complaints. He does admit to being tired. He relates that to his father's illness. Review vital signs Chest clear to auscultation Cardiac exam S1-S2 are regular Extremities no edema

## 2014-02-22 NOTE — Assessment & Plan Note (Signed)
This is a new diagnosis. I think he needs treatment. Will start lisinopril. Side effects discussed. Recheck blood pressure in one month.

## 2014-03-18 ENCOUNTER — Other Ambulatory Visit: Payer: Self-pay | Admitting: Internal Medicine

## 2014-03-19 ENCOUNTER — Encounter: Payer: Self-pay | Admitting: Physician Assistant

## 2014-03-19 ENCOUNTER — Ambulatory Visit: Payer: BC Managed Care – PPO | Admitting: *Deleted

## 2014-03-19 ENCOUNTER — Ambulatory Visit (INDEPENDENT_AMBULATORY_CARE_PROVIDER_SITE_OTHER): Payer: BC Managed Care – PPO | Admitting: Physician Assistant

## 2014-03-19 VITALS — BP 146/98 | HR 80 | Temp 98.5°F | Resp 16 | Ht 67.0 in | Wt 186.0 lb

## 2014-03-19 DIAGNOSIS — I10 Essential (primary) hypertension: Secondary | ICD-10-CM

## 2014-03-19 NOTE — Progress Notes (Signed)
Pre visit review using our clinic review tool, if applicable. No additional management support is needed unless otherwise documented below in the visit note. 

## 2014-03-19 NOTE — Patient Instructions (Addendum)
Your blood pressure came down significantly after sitting quietly for a few minutes.  Please check your blood pressure 1 to 2 extra times per day for the next 3 days and call on Friday to tell Dr. Zannie Kehr nurse what your blood pressures have been running.  Followup if symptoms develop and as needed.  Hypertension Hypertension is another name for high blood pressure. High blood pressure may mean that your heart needs to work harder to pump blood. Blood pressure consists of two numbers, which includes a higher number over a lower number (example: 110/72). HOME CARE   Make lifestyle changes as told by your doctor. This may include weight loss and exercise.  Take your blood pressure medicine every day.  Limit how much salt you use.  Stop smoking if you smoke.  Do not use drugs.  Talk to your doctor if you are using decongestants or birth control pills. These medicines might make blood pressure higher.  Females should not drink more than 1 alcoholic drink per day. Males should not drink more than 2 alcoholic drinks per day.  See your doctor as told. GET HELP RIGHT AWAY IF:   You have a blood pressure reading with a top number of 180 or higher.  You get a very bad headache.  You get blurred or changing vision.  You feel confused.  You feel weak, numb, or faint.  You get chest or belly (abdominal) pain.  You throw up (vomit).  You cannot breathe very well. MAKE SURE YOU:   Understand these instructions.  Will watch your condition.  Will get help right away if you are not doing well or get worse. Document Released: 04/26/2008 Document Revised: 01/31/2012 Document Reviewed: 04/26/2008 [facility] Patient Information 2014 [place], [place].

## 2014-03-19 NOTE — Progress Notes (Signed)
Subjective:    Patient ID: Garrett [name], male    DOB: [birth year], 38 y.o.   MRN: [id number]  Hypertension Pertinent negatives include no blurred vision, chest pain, headaches, malaise/fatigue, neck pain, orthopnea, palpitations, peripheral edema, PND, shortness of breath or sweats.  Patient is a 38 year old Caucasian male presenting to the clinic today for a blood pressure check. Patient is currently on monotherapy with lisinopril and he tolerates his medication well. Patient's first blood pressure was elevated at 172/102 by the nurse, and it was 185/105 using his blood pressure cuff immediately after. Patient states that his blood pressures are home are usually in the 101B systolic and average 510/25. Patient states that he currently feels anxious because he had to wait for 30 minutes in the waiting room prior to his visit, and he has to take his father to a doctor's appointment early this afternoon. He believes that this was the cause of his elevated blood pressure, stating that in the past his blood pressure has been elevated for the same thing. Patient currently denies symptoms of lightheadedness, dizziness, headache, syncope and presyncope. Patient states he has no symptoms at home either. After sitting quietly in a room for 10 minutes alone, his blood pressure had decreased to 146/98 per the nurse. He denies any other symptoms at this time.    Review of Systems  Constitutional: Negative for malaise/fatigue.  Eyes: Negative for blurred vision.  Respiratory: Negative for shortness of breath.   Cardiovascular: Negative for chest pain, palpitations, orthopnea and PND.  Musculoskeletal: Negative for neck pain.  Neurological: Negative for dizziness, syncope, light-headedness and headaches.  All other systems reviewed and are negative.  Past Medical History  Diagnosis Date  . Allergy   . Depression   . GERD (gastroesophageal reflux disease)   . Acne   . External hemorrhoids   . HLD  (hyperlipidemia)   . Anxiety   . Strabismus   . Irritable bowel syndrome    Past Surgical History  Procedure Laterality Date  . Strabismus surgery  2001, 2010    X 2  . Wisdom tooth extraction    . Flexible sigmoidoscopy    . Colonoscopy  10/16/12    reports that he has never smoked. He has never used smokeless tobacco. He reports that he drinks alcohol. He reports that he does not use illicit drugs. family history includes Breast cancer in his mother; Hyperlipidemia in his father and mother; Hypertension in his father and mother; Prostate cancer in his father; Ulcerative colitis in his father. There is no history of Colon cancer, Rectal cancer, Stomach cancer, or Esophageal cancer. No Known Allergies      Objective:   Physical Exam  Nursing note and vitals reviewed. Constitutional: He is oriented to person, place, and time. He appears well-developed and well-nourished. No distress.  HENT:  Head: Normocephalic and atraumatic.  Eyes: Conjunctivae and EOM are normal. Pupils are equal, round, and reactive to light.  Neck: Normal range of motion. Neck supple. No JVD present.  Cardiovascular: Normal rate, regular rhythm, normal heart sounds and intact distal pulses.  Exam reveals no gallop and no friction rub.   No murmur heard. Pulmonary/Chest: Effort normal and breath sounds normal. No stridor. No respiratory distress. He has no wheezes. He has no rales. He exhibits no tenderness.  Abdominal: Soft. Bowel sounds are normal.  Musculoskeletal: Normal range of motion. He exhibits no edema.  Neurological: He is alert and oriented to person, place, and time. He has normal  reflexes. No cranial nerve deficit. Coordination normal.  Skin: Skin is warm and dry. No rash noted. He is not diaphoretic. No erythema. No pallor.  Psychiatric: He has a normal mood and affect. His behavior is normal. Judgment and thought content normal.   Filed Vitals:   03/19/14 1220  BP: 146/98  Pulse: 80  Temp:     Resp: 16   Lab Results  Component Value Date   WBC 5.7 03/15/2013   HGB 14.2 03/15/2013   HCT 42.0 03/15/2013   PLT 236.0 03/15/2013   GLUCOSE 82 08/02/2013   CHOL 129 08/02/2013   TRIG 192.0* 08/02/2013   HDL 39.80 08/02/2013   LDLDIRECT 122.9 04/18/2008   LDLCALC 51 08/02/2013   ALT 18 08/02/2013   AST 22 08/02/2013   NA 138 08/02/2013   K 3.6 08/02/2013   CL 104 08/02/2013   CREATININE 0.8 08/02/2013   BUN 7 08/02/2013   CO2 24 08/02/2013   TSH 1.04 08/14/2012      Assessment & Plan:  Garrett [name] was seen today for hypertension.  Diagnoses and associated orders for this visit:  Hypertension    Patient Instructions  Your blood pressure came down significantly after sitting quietly for a few minutes.  Please check your blood pressure 1 to 2 extra times per day for the next 3 days and call on Friday to tell Dr. Zannie Kehr nurse what your blood pressures have been running.  Followup if symptoms develop and as needed.

## 2014-03-20 ENCOUNTER — Telehealth: Payer: Self-pay | Admitting: Internal Medicine

## 2014-03-20 MED ORDER — OMEPRAZOLE 20 MG PO CPDR: 20.0000 mg | DELAYED_RELEASE_CAPSULE | Freq: Every day | ORAL | Status: DC

## 2014-03-20 MED ORDER — COLESTIPOL HCL 1 G PO TABS: 2.0000 g | ORAL_TABLET | Freq: Two times a day (BID) | ORAL | Status: DC

## 2014-03-20 NOTE — Telephone Encounter (Signed)
Refills sent in as requested (Prilosec and Colestipol) and appt made for June.

## 2014-03-22 ENCOUNTER — Other Ambulatory Visit: Payer: Self-pay | Admitting: Internal Medicine

## 2014-03-23 ENCOUNTER — Other Ambulatory Visit: Payer: Self-pay | Admitting: Internal Medicine

## 2014-05-15 ENCOUNTER — Ambulatory Visit (INDEPENDENT_AMBULATORY_CARE_PROVIDER_SITE_OTHER): Payer: BC Managed Care – PPO | Admitting: Internal Medicine

## 2014-05-15 ENCOUNTER — Encounter: Payer: Self-pay | Admitting: Internal Medicine

## 2014-05-15 VITALS — BP 140/96 | HR 88 | Ht 67.0 in | Wt 182.2 lb

## 2014-05-15 DIAGNOSIS — K219 Gastro-esophageal reflux disease without esophagitis: Secondary | ICD-10-CM

## 2014-05-15 DIAGNOSIS — K589 Irritable bowel syndrome without diarrhea: Secondary | ICD-10-CM

## 2014-05-15 MED ORDER — COLESTIPOL HCL 1 G PO TABS: 2.0000 g | ORAL_TABLET | Freq: Two times a day (BID) | ORAL | Status: DC

## 2014-05-15 MED ORDER — HYOSCYAMINE SULFATE 0.125 MG SL SUBL: 0.1250 mg | SUBLINGUAL_TABLET | SUBLINGUAL | Status: DC | PRN

## 2014-05-15 MED ORDER — OMEPRAZOLE 20 MG PO CPDR: 20.0000 mg | DELAYED_RELEASE_CAPSULE | Freq: Every day | ORAL | Status: DC

## 2014-05-15 NOTE — Assessment & Plan Note (Addendum)
Doing well mostly Try hyoscyamine 0.125 mg prn instead of dicyclomine (sedation side effect, light headed) Refill Colestipol RTC 12-18 months

## 2014-05-15 NOTE — Patient Instructions (Addendum)
We have sent the following medications to your pharmacy for you to pick up at your convenience: Omeprazole, hyoscyamine, colestipol  Follow up with [place] in 12-18 months.   I appreciate the opportunity to care for you.

## 2014-05-15 NOTE — Assessment & Plan Note (Signed)
Doing well Refill omeprazole RTC 12-18 months

## 2014-05-15 NOTE — Progress Notes (Signed)
         Subjective:    Patient ID: Garrett [name], male    DOB: 30-Jan-1976, 38 y.o.   MRN: [id number]  HPI Doing well overall on colestipol Dicyclomine prn - helps cramps but makes him light-headed GERD ok on omeprazole Medications, allergies, past medical history, past surgical history, family history and social history are reviewed and updated in the EMR.  Review of Systems As above    Objective:   Physical Exam WDWN NAD    Assessment & Plan:  IBS (irritable bowel syndrome) diarrhea predominant Doing well mostly Try hyoscyamine 0.125 mg prn instead of dicyclomine (sedation side effect, light headed) Refill Colestipol RTC 12-18 months  GERD (gastroesophageal reflux disease) Doing well Refill omeprazole RTC 12-18 months

## 2014-05-27 ENCOUNTER — Other Ambulatory Visit: Payer: Self-pay | Admitting: Dermatology

## 2014-09-23 ENCOUNTER — Other Ambulatory Visit: Payer: Self-pay | Admitting: Internal Medicine

## 2014-10-25 ENCOUNTER — Ambulatory Visit (INDEPENDENT_AMBULATORY_CARE_PROVIDER_SITE_OTHER): Payer: BC Managed Care – PPO | Admitting: Family Medicine

## 2014-10-25 ENCOUNTER — Encounter: Payer: Self-pay | Admitting: Family Medicine

## 2014-10-25 VITALS — BP 140/82 | HR 79 | Temp 98.3°F | Wt 190.0 lb

## 2014-10-25 DIAGNOSIS — E785 Hyperlipidemia, unspecified: Secondary | ICD-10-CM

## 2014-10-25 DIAGNOSIS — I10 Essential (primary) hypertension: Secondary | ICD-10-CM

## 2014-10-25 DIAGNOSIS — F329 Major depressive disorder, single episode, unspecified: Secondary | ICD-10-CM

## 2014-10-25 DIAGNOSIS — K219 Gastro-esophageal reflux disease without esophagitis: Secondary | ICD-10-CM

## 2014-10-25 DIAGNOSIS — J302 Other seasonal allergic rhinitis: Secondary | ICD-10-CM

## 2014-10-25 DIAGNOSIS — F32A Depression, unspecified: Secondary | ICD-10-CM

## 2014-10-25 DIAGNOSIS — K589 Irritable bowel syndrome without diarrhea: Secondary | ICD-10-CM

## 2014-10-25 MED ORDER — CITALOPRAM HYDROBROMIDE 20 MG PO TABS: 20.0000 mg | ORAL_TABLET | Freq: Every day | ORAL | Status: DC

## 2014-10-25 NOTE — Assessment & Plan Note (Signed)
Mild poor control on lisinopril 20 mg. Patient's weight is up almost 10 pounds. I discussed increasing his lisinopril and giving him time to work on diet and exercise. He states he would prefer to work on his weight first through diet and exercise and consider medication increase in 6 months when he follows up with me.

## 2014-10-25 NOTE — Patient Instructions (Signed)
Blood pressure up slightly on the top #, bottom # is at goal. Goal is <140/90. Your weight is up about 10 lbs. You set a goal to work on this and hold off on increasing blood pressure medicine.  Let's check in 4-6 months from now unless you check at home and regularly above 145/90. Glad they verified your home cuff already.

## 2014-10-25 NOTE — Progress Notes (Signed)
Garrett Reddish, MD Phone: [phone]  Subjective:  Patient presents today to establish care with me as their new primary care provider. Patient was formerly a patient of [name]. Chief complaint-noted.   Hypertension-started on lisinopril in februray and improved control since then.   BP Readings from Last 3 Encounters:  10/25/14 140/82  05/15/14 140/96  03/19/14 146/98  Home BP monitoring-120s over 70s at home several months ago and not checking recently.  Compliant with medications-yes without side effects ROS-Denies any CP, HA, SOB, blurry vision, LE edema, transient weakness, orthopnea, PND.   The following were reviewed and entered/updated in epic: Past Medical History  Diagnosis Date  . Allergy   . Depression   . GERD (gastroesophageal reflux disease)   . Acne   . External hemorrhoids     2004  . HLD (hyperlipidemia)   . Anxiety   . Strabismus   . Irritable bowel syndrome    Patient Active Problem List   Diagnosis Date Noted  . Hypertension 02/19/2014    Priority: Medium  . IBS (irritable bowel syndrome) diarrhea predominant 08/14/2012    Priority: Medium  . Allergic rhinitis 02/21/2012    Priority: Medium  . Hyperlipidemia 09/05/2007    Priority: Medium  . Depression 05/25/2007    Priority: Medium  . GERD (gastroesophageal reflux disease) 11/29/2012    Priority: Low   Past Surgical History  Procedure Laterality Date  . Strabismus surgery  2001, 2010    X 2  . Wisdom tooth extraction    . Flexible sigmoidoscopy      hemorrhoids-rule out internal bleeding  . Colonoscopy  10/16/12    Family History  Problem Relation Age of Onset  . Breast cancer Mother   . Hypertension Mother   . Hyperlipidemia Mother   . Prostate cancer Father     late 76s  . Ulcerative colitis Father   . Hypertension Father   . Hyperlipidemia Father   . Colon cancer Neg Hx   . Rectal cancer Neg Hx   . Stomach cancer Neg Hx   . Esophageal cancer Neg Hx     Medications-  reviewed and updated Current Outpatient Prescriptions  Medication Sig Dispense Refill  . Alpha-D-Galactosidase (BEANO PO) Take by mouth as needed.    Marland Kitchen azelastine (ASTELIN) 137 MCG/SPRAY nasal spray 1 spray by Nasal route 2 (two) times daily. Use in each nostril as directed     . budesonide (RHINOCORT AQUA) 32 MCG/ACT nasal spray 1 spray by Nasal route daily.      . citalopram (CELEXA) 20 MG tablet Take 1 tablet (20 mg total) by mouth daily. 90 tablet 3  . colestipol (MICRONIZED COLESTIPOL HCL) 1 G tablet Take 2 tablets (2 g total) by mouth 2 (two) times daily. 360 tablet 3  . desloratadine (CLARINEX) 5 MG tablet Take 5 mg by mouth daily.      . hyoscyamine (LEVSIN SL) 0.125 MG SL tablet Place 1 tablet (0.125 mg total) under the tongue every 4 (four) hours as needed. 60 tablet 5  . lisinopril (PRINIVIL,ZESTRIL) 20 MG tablet Take 1 tablet (20 mg total) by mouth daily. 90 tablet 3  . NON FORMULARY Allergy shots weekly    . omeprazole (PRILOSEC) 20 MG capsule Take 1 capsule (20 mg total) by mouth daily. 90 capsule 3  . pravastatin (PRAVACHOL) 40 MG tablet TAKE ONE TABLET BY MOUTH ONCE DAILY 90 tablet 1  . Calcium Carbonate Antacid (TUMS PO) Take by mouth as needed.    Marland Kitchen  Loperamide HCl (IMODIUM A-D PO) Take by mouth as needed.     No current facility-administered medications for this visit.    Allergies-reviewed and updated No Known Allergies  History   Social History  . Marital Status: Single    Spouse Name: N/A    Number of Children: 0  . Years of Education: N/A   Occupational History  . day trader    Social History Main Topics  . Smoking status: Never Smoker   . Smokeless tobacco: Never Used  . Alcohol Use: 0.0 oz/week    0 Not specified per week     Comment: rare 1-2x a month  . Drug Use: No  . Sexual Activity: None   Other Topics Concern  . None   Social History Narrative   Single. Live with parents.       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 Oropharynx normal. TM normal.  CV: RRR no murmurs rubs or gallops Lungs: CTAB no crackles, wheeze, rhonchi Abdomen: soft/nontender/nondistended/normal bowel sounds.   Ext: no edema Skin: warm, dry, no rash Neuro: grossly normal, moves all extremities, PERRLA   Assessment/Plan:  Hypertension Mild poor control on lisinopril 20 mg. Patient's weight is up almost 10 pounds. I discussed increasing his lisinopril and giving him time to work on diet and exercise. He states he would prefer to work on his weight first through diet and exercise and consider medication increase in 6 months when he follows up with me.   Meds ordered this encounter  Medications  . citalopram (CELEXA) 20 MG tablet    Sig: Take 1 tablet (20 mg total) by mouth daily.    Dispense:  90 tablet    Refill:  3

## 2014-12-22 ENCOUNTER — Other Ambulatory Visit: Payer: Self-pay | Admitting: Internal Medicine

## 2014-12-30 ENCOUNTER — Other Ambulatory Visit: Payer: Self-pay | Admitting: Internal Medicine

## 2015-02-17 ENCOUNTER — Other Ambulatory Visit: Payer: Self-pay | Admitting: Internal Medicine

## 2015-03-24 ENCOUNTER — Other Ambulatory Visit: Payer: Self-pay | Admitting: Family Medicine

## 2015-04-25 ENCOUNTER — Encounter: Payer: Self-pay | Admitting: Family Medicine

## 2015-04-25 ENCOUNTER — Ambulatory Visit (INDEPENDENT_AMBULATORY_CARE_PROVIDER_SITE_OTHER): Payer: BLUE CROSS/BLUE SHIELD | Admitting: Family Medicine

## 2015-04-25 VITALS — BP 130/24 | HR 84 | Temp 98.5°F | Wt 188.0 lb

## 2015-04-25 DIAGNOSIS — E785 Hyperlipidemia, unspecified: Secondary | ICD-10-CM

## 2015-04-25 DIAGNOSIS — I10 Essential (primary) hypertension: Secondary | ICD-10-CM

## 2015-04-25 NOTE — Assessment & Plan Note (Signed)
Improved control down 2 lbs on Lisinopril 20mg .

## 2015-04-25 NOTE — Assessment & Plan Note (Signed)
Controlled previously. Advised weight loss. Continue pravastatin. Lipids before CPE 6 months.

## 2015-04-25 NOTE — Progress Notes (Signed)
Pre visit review using our clinic review tool, if applicable. No additional management support is needed unless otherwise documented below in the visit note. 

## 2015-04-25 NOTE — Progress Notes (Signed)
Garrett Reddish, MD  Subjective:  Garrett Stakes. is a 39 y.o. year old very pleasant male patient who presents with:  Wt up 10 lbs last visit, mild poor control on lisinopril 20mg . Controlled today, weight down 2 lbs.   Hypertension-good control  BP Readings from Last 3 Encounters:  04/25/15 130/24  10/25/14 140/82  05/15/14 140/96   Home BP monitoring-averages 121/75 on weekly checks Compliant with medications-yes without side effects ROS-Denies any CP, HA, SOB, blurry vision, LE edema, transient weakness, orthopnea, PND.   Hyperlipidemia-reasonable control last check on pravastatin 40mg   Lab Results  Component Value Date   LDLCALC 51 08/02/2013  Regular exercise: no, advised Diet: poor choices ROS- no chest pain or shortness of breath. No myalgias  Past Medical History- depression, allergic rhinitis, IBS, GERD  Medications- reviewed and updated Current Outpatient Prescriptions  Medication Sig Dispense Refill  . Alpha-D-Galactosidase (BEANO PO) Take by mouth as needed.    Marland Kitchen azelastine (ASTELIN) 137 MCG/SPRAY nasal spray 1 spray by Nasal route 2 (two) times daily. Use in each nostril as directed     . budesonide (RHINOCORT AQUA) 32 MCG/ACT nasal spray 1 spray by Nasal route daily.      . Calcium Carbonate Antacid (TUMS PO) Take by mouth as needed.    . citalopram (CELEXA) 20 MG tablet Take 1 tablet (20 mg total) by mouth daily. 90 tablet 3  . colestipol (MICRONIZED COLESTIPOL HCL) 1 G tablet Take 2 tablets (2 g total) by mouth 2 (two) times daily. 360 tablet 3  . desloratadine (CLARINEX) 5 MG tablet Take 5 mg by mouth daily.      Marland Kitchen Flavoring Agent (PEPPERMINT FLAVOR) OIL Take 1 capsule by mouth daily.    . hyoscyamine (LEVSIN SL) 0.125 MG SL tablet Place 1 tablet (0.125 mg total) under the tongue every 4 (four) hours as needed. 60 tablet 5  . lisinopril (PRINIVIL,ZESTRIL) 20 MG tablet TAKE ONE TABLET BY MOUTH ONCE DAILY 90 tablet 0  . Loperamide HCl (IMODIUM A-D PO) Take by  mouth as needed.    . NON FORMULARY Allergy shots weekly    . Omega-3 Fatty Acids (FISH OIL) 1200 MG CAPS Take 1 capsule by mouth daily.    Marland Kitchen omeprazole (PRILOSEC) 20 MG capsule Take 1 capsule (20 mg total) by mouth daily. 90 capsule 3  . pravastatin (PRAVACHOL) 40 MG tablet TAKE ONE TABLET BY MOUTH ONCE DAILY 90 tablet 2   No current facility-administered medications for this visit.    Objective: BP 130/24 mmHg  Pulse 84  Temp(Src) 98.5 F (36.9 C) (Oral)  Wt 188 lb (85.276 kg) Gen: NAD, resting comfortably in chair CV: RRR no murmurs rubs or gallops Lungs: CTAB no crackles, wheeze, rhonchi Abdomen: soft/nontender/nondistended/normal bowel sounds. No rebound or guarding. overweight Ext: no edema Skin: warm, dry, no rash Neuro: grossly normal, moves all extremities   Assessment/Plan:  Hyperlipidemia Controlled previously. Advised weight loss. Continue pravastatin. Lipids before CPE 6 months.    Hypertension Improved control down 2 lbs on Lisinopril 20mg .    6 month CPE

## 2015-04-25 NOTE — Patient Instructions (Signed)
Blood pressure at home and in office looks great over last few months. COntinue current medicine  Last set of cholesterol looked good  For both, would love to see your weight in 160-170 range and have you exercising 150 minutes a week (even if that is jogging in place while watching a show you like)  Check in around 6 months for a physical with labs a week before

## 2015-05-12 ENCOUNTER — Other Ambulatory Visit: Payer: Self-pay | Admitting: Internal Medicine

## 2015-05-12 ENCOUNTER — Other Ambulatory Visit: Payer: Self-pay | Admitting: Family Medicine

## 2015-05-22 ENCOUNTER — Other Ambulatory Visit: Payer: Self-pay | Admitting: Internal Medicine

## 2015-05-24 ENCOUNTER — Other Ambulatory Visit: Payer: Self-pay | Admitting: Internal Medicine

## 2015-07-25 ENCOUNTER — Ambulatory Visit (INDEPENDENT_AMBULATORY_CARE_PROVIDER_SITE_OTHER): Payer: BLUE CROSS/BLUE SHIELD | Admitting: Internal Medicine

## 2015-07-25 ENCOUNTER — Encounter: Payer: Self-pay | Admitting: Internal Medicine

## 2015-07-25 VITALS — BP 128/64 | HR 76 | Ht 67.0 in | Wt 183.1 lb

## 2015-07-25 DIAGNOSIS — K219 Gastro-esophageal reflux disease without esophagitis: Secondary | ICD-10-CM | POA: Diagnosis not present

## 2015-07-25 DIAGNOSIS — K589 Irritable bowel syndrome without diarrhea: Secondary | ICD-10-CM

## 2015-07-25 MED ORDER — COLESTIPOL HCL 1 G PO TABS: 2.0000 g | ORAL_TABLET | Freq: Two times a day (BID) | ORAL | Status: DC

## 2015-07-25 MED ORDER — OMEPRAZOLE 20 MG PO CPDR: DELAYED_RELEASE_CAPSULE | ORAL | Status: DC

## 2015-07-25 NOTE — Patient Instructions (Addendum)
   Colestipol and omeprazole refilled. You can try to taper off omeprazole as we discussed.  I appreciate the opportunity to care for you. Gatha Mayer, MD, [facility]

## 2015-07-25 NOTE — Assessment & Plan Note (Signed)
Refill colestipol Consider Viberzi in future

## 2015-07-25 NOTE — Progress Notes (Signed)
   Subjective:    Patient ID: Garrett [name]., male    DOB: January 23, 1976, 39 y.o.   MRN: [id number] Cc: f/u IBS-diarrhea and GERD HPI  Doing well w/o complaints. No heartburn issues for a long time - ? If he can come off omeprazole. Medications, allergies, past medical history, past surgical history, family history and social history are reviewed and updated in the EMR.  Review of Systems As above    Objective:   Physical Exam BP 128/64 mmHg  Pulse 76  Ht 5\' 7"  (1.702 m)  Wt 183 lb 2 oz (83.065 kg)  BMI 28.67 kg/m2 NAD     Assessment & Plan:  IBS (irritable bowel syndrome) diarrhea predominant Refill colestipol Consider Viberzi in future  GERD (gastroesophageal reflux disease) Refill omeprazole  He will see if he can taper off  RTC 1 yr sooner prn

## 2015-07-25 NOTE — Assessment & Plan Note (Signed)
Refill omeprazole  He will see if he can taper off

## 2015-08-21 ENCOUNTER — Encounter: Payer: Self-pay | Admitting: Family Medicine

## 2015-09-29 ENCOUNTER — Other Ambulatory Visit: Payer: Self-pay | Admitting: Family Medicine

## 2015-10-23 ENCOUNTER — Other Ambulatory Visit (INDEPENDENT_AMBULATORY_CARE_PROVIDER_SITE_OTHER): Payer: BLUE CROSS/BLUE SHIELD

## 2015-10-23 DIAGNOSIS — Z0000 Encounter for general adult medical examination without abnormal findings: Secondary | ICD-10-CM

## 2015-10-23 LAB — CBC WITH DIFFERENTIAL/PLATELET
BASOS ABS: 0 10*3/uL (ref 0.0–0.1)
Basophils Relative: 0.4 % (ref 0.0–3.0)
EOS ABS: 0.2 10*3/uL (ref 0.0–0.7)
Eosinophils Relative: 3.6 % (ref 0.0–5.0)
HEMATOCRIT: 40.6 % (ref 39.0–52.0)
Hemoglobin: 13.6 g/dL (ref 13.0–17.0)
LYMPHS ABS: 2.2 10*3/uL (ref 0.7–4.0)
LYMPHS PCT: 37 % (ref 12.0–46.0)
MCHC: 33.5 g/dL (ref 30.0–36.0)
MCV: 84.3 fl (ref 78.0–100.0)
Monocytes Absolute: 0.4 10*3/uL (ref 0.1–1.0)
Monocytes Relative: 6.6 % (ref 3.0–12.0)
NEUTROS ABS: 3.2 10*3/uL (ref 1.4–7.7)
NEUTROS PCT: 52.4 % (ref 43.0–77.0)
PLATELETS: 234 10*3/uL (ref 150.0–400.0)
RBC: 4.82 Mil/uL (ref 4.22–5.81)
RDW: 12.6 % (ref 11.5–15.5)
WBC: 6.1 10*3/uL (ref 4.0–10.5)

## 2015-10-23 LAB — BASIC METABOLIC PANEL
BUN: 9 mg/dL (ref 6–23)
CO2: 27 meq/L (ref 19–32)
Calcium: 9.6 mg/dL (ref 8.4–10.5)
Chloride: 103 mEq/L (ref 96–112)
Creatinine, Ser: 0.82 mg/dL (ref 0.40–1.50)
GFR: 110.66 mL/min (ref >60.00)
GLUCOSE: 83 mg/dL (ref 70–99)
POTASSIUM: 4.6 meq/L (ref 3.5–5.1)
SODIUM: 139 meq/L (ref 135–145)

## 2015-10-23 LAB — LIPID PANEL
CHOLESTEROL: 129 mg/dL (ref 0–200)
HDL: 41.3 mg/dL (ref >39.00)
LDL CALC: 63 mg/dL (ref 0–99)
NonHDL: 87.25
TRIGLYCERIDES: 123 mg/dL (ref 0.0–149.0)
Total CHOL/HDL Ratio: 3
VLDL: 24.6 mg/dL (ref 0.0–40.0)

## 2015-10-23 LAB — HEPATIC FUNCTION PANEL
ALK PHOS: 62 U/L (ref 39–117)
ALT: 16 U/L (ref 0–53)
AST: 15 U/L (ref 0–37)
Albumin: 4.7 g/dL (ref 3.5–5.2)
BILIRUBIN DIRECT: 0.2 mg/dL (ref 0.0–0.3)
Total Bilirubin: 1 mg/dL (ref 0.2–1.2)
Total Protein: 7.2 g/dL (ref 6.0–8.3)

## 2015-10-23 LAB — POCT URINALYSIS DIPSTICK
Bilirubin, UA: NEGATIVE
Blood, UA: NEGATIVE
GLUCOSE UA: NEGATIVE
Ketones, UA: NEGATIVE
Leukocytes, UA: NEGATIVE
NITRITE UA: NEGATIVE
Protein, UA: NEGATIVE
Spec Grav, UA: 1.015
UROBILINOGEN UA: 0.2
pH, UA: 7

## 2015-10-23 LAB — TSH: TSH: 1.58 u[IU]/mL (ref 0.35–4.50)

## 2015-10-28 ENCOUNTER — Encounter: Payer: Self-pay | Admitting: Family Medicine

## 2015-10-28 ENCOUNTER — Ambulatory Visit (INDEPENDENT_AMBULATORY_CARE_PROVIDER_SITE_OTHER): Payer: BLUE CROSS/BLUE SHIELD | Admitting: Family Medicine

## 2015-10-28 VITALS — BP 128/88 | Temp 98.1°F | Ht 67.0 in | Wt 191.0 lb

## 2015-10-28 DIAGNOSIS — Z0000 Encounter for general adult medical examination without abnormal findings: Secondary | ICD-10-CM | POA: Diagnosis not present

## 2015-10-28 DIAGNOSIS — F329 Major depressive disorder, single episode, unspecified: Secondary | ICD-10-CM

## 2015-10-28 DIAGNOSIS — F32A Depression, unspecified: Secondary | ICD-10-CM

## 2015-10-28 MED ORDER — FLUOXETINE HCL 20 MG PO TABS: 20.0000 mg | ORAL_TABLET | Freq: Every day | ORAL | Status: DC

## 2015-10-28 NOTE — Assessment & Plan Note (Addendum)
poor control on celexa 20mg  with PHQ9 of 12 and GAD7 of 7. Citalopram 20mg  cant up due to PPI--> change to prozac 20mg .  follow up 6 weeks. No SI/HI

## 2015-10-28 NOTE — Patient Instructions (Addendum)
Return in 6 weeks for depression follow up and 6 months for regular follow up  Change to prozac 20mg  due to drug interaction with higher dose of celexa  No other change

## 2015-10-28 NOTE — Progress Notes (Signed)
Garrett Reddish, MD Phone: [phone]  Subjective:  Patient presents today for their annual physical. Chief complaint-noted.   See problem oriented charting- ROS- full  review of systems was completed and negative except for: cough, congestion, postnasal drip with URI symptoms for a few days.   The following were reviewed and entered/updated in epic: Past Medical History  Diagnosis Date  . Allergy   . Depression   . GERD (gastroesophageal reflux disease)   . Acne   . External hemorrhoids     2004  . HLD (hyperlipidemia)   . Anxiety   . Strabismus   . Irritable bowel syndrome    Patient Active Problem List   Diagnosis Date Noted  . Hypertension 02/19/2014    Priority: Medium  . IBS (irritable bowel syndrome) diarrhea predominant 08/14/2012    Priority: Medium  . Allergic rhinitis 02/21/2012    Priority: Medium  . Hyperlipidemia 09/05/2007    Priority: Medium  . Depression 05/25/2007    Priority: Medium  . GERD (gastroesophageal reflux disease) 11/29/2012    Priority: Low   Past Surgical History  Procedure Laterality Date  . Strabismus surgery  2001, 2010    X 2  . Wisdom tooth extraction    . Flexible sigmoidoscopy      hemorrhoids-rule out internal bleeding  . Colonoscopy  10/16/12    Family History  Problem Relation Age of Onset  . Breast cancer Mother   . Hypertension Mother   . Hyperlipidemia Mother   . Prostate cancer Father     late 3s  . Ulcerative colitis Father   . Hypertension Father   . Hyperlipidemia Father   . Colon cancer Neg Hx   . Rectal cancer Neg Hx   . Stomach cancer Neg Hx   . Esophageal cancer Neg Hx     Medications- reviewed and updated Current Outpatient Prescriptions  Medication Sig Dispense Refill  . Alpha-D-Galactosidase (BEANO PO) Take by mouth as needed.    Marland Kitchen azelastine (ASTELIN) 137 MCG/SPRAY nasal spray 1 spray by Nasal route 2 (two) times daily. Use in each nostril as directed     . budesonide (RHINOCORT AQUA) 32  MCG/ACT nasal spray 1 spray by Nasal route daily.      . Calcium Carbonate Antacid (TUMS PO) Take by mouth as needed.    . citalopram (CELEXA) 20 MG tablet TAKE ONE TABLET BY MOUTH ONCE DAILY 90 tablet 3  . colestipol (MICRONIZED COLESTIPOL HCL) 1 G tablet Take 2 tablets (2 g total) by mouth 2 (two) times daily. 360 tablet 0  . desloratadine (CLARINEX) 5 MG tablet Take 5 mg by mouth daily.      Marland Kitchen Flavoring Agent (PEPPERMINT FLAVOR) OIL Take 1 capsule by mouth daily.    Marland Kitchen lisinopril (PRINIVIL,ZESTRIL) 20 MG tablet TAKE ONE TABLET BY MOUTH ONCE DAILY 90 tablet 2  . Loperamide HCl (IMODIUM A-D PO) Take by mouth as needed.    . NON FORMULARY Allergy shots weekly    . Omega-3 Fatty Acids (FISH OIL) 1200 MG CAPS Take 1 capsule by mouth daily.    Marland Kitchen omeprazole (PRILOSEC) 20 MG capsule TAKE ONE CAPSULE BY MOUTH ONCE DAILY 30  TO  60  MINUTES  BEFORE  BREAKFAST 90 capsule 3  . pravastatin (PRAVACHOL) 40 MG tablet TAKE ONE TABLET BY MOUTH ONCE DAILY 90 tablet 2  . hyoscyamine (LEVSIN SL) 0.125 MG SL tablet Place 1 tablet (0.125 mg total) under the tongue every 4 (four) hours as needed. (Patient not  taking: Reported on 10/28/2015) 60 tablet 5   Allergies-reviewed and updated No Known Allergies  Social History   Social History  . Marital Status: Single    Spouse Name: N/A  . Number of Children: 0  . Years of Education: N/A   Occupational History  . day trader    Social History Main Topics  . Smoking status: Never Smoker   . Smokeless tobacco: Never Used  . Alcohol Use: 0.0 oz/week    0 Standard drinks or equivalent per week     Comment: rare 1-2x a month  . Drug Use: No  . Sexual Activity: Not Asked   Other Topics Concern  . None   Social History Narrative   Single. Live with parents.       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 Oropharynx normal Neck: no thyromegaly CV: RRR no murmurs rubs or gallops Lungs: CTAB no crackles, wheeze, rhonchi Abdomen: soft/nontender/nondistended/normal bowel sounds. No rebound or guarding.  Ext: no edema, 2+ PT pulses Skin: warm, dry Neuro: grossly normal, moves all extremities, PERRLA  Assessment/Plan:  39 y.o. male presenting for annual physical.  Health Maintenance counseling: 1. Anticipatory guidance: Patient counseled regarding regular dental exams, eye exams, wearing seatbelts.  2. Risk factor reduction:  Advised patient of need for regular exercise and diet rich and fruits and vegetables to reduce risk of heart attack and stroke. Ordered fit bit planning on walking 3. Immunizations/screenings/ancillary studies- up to date Immunization History  Administered Date(s) Administered  . Influenza Split 09/11/2012  . Influenza Whole 08/22/2005, 09/04/2007, 08/03/2010  . Influenza,inj,Quad PF,36+ Mos 08/09/2013  . Influenza-Unspecified 09/05/2014, 08/21/2015  . Td 01/29/2010  . Tdap 03/12/2008  4. Prostate cancer screening- start at age 45 earliest   52. Colon cancer screening - no family history- start age 69 6. Skin cancer screening- went to dermatologist last year and no issues-will return as needed  Depression poor control on celexa 20mg  with PHQ9 of 12 and GAD7 of 7. Citalopram 20mg  cant up due to PPI--> change to prozac 20mg .  follow up 6 weeks. No SI/HI   Hypertension- controlled on lisinopril 20mg . Does home monitoring averaging 122/78.  Hyperlipidemia- controlled on pravastatin with excellent control.  IBS- controlled and monitored by Dr. Carlean Purl Allergic Rhinitis- on clarinex, rhinocort, astelin and doing well  Meds ordered this encounter  Medications  . FLUoxetine (PROZAC) 20 MG tablet    Sig: Take 1 tablet (20 mg total) by mouth daily.      Dispense:  30 tablet    Refill:  3   6 month follow up Return precautions advised.

## 2015-12-09 ENCOUNTER — Encounter: Payer: Self-pay | Admitting: Family Medicine

## 2015-12-09 ENCOUNTER — Ambulatory Visit (INDEPENDENT_AMBULATORY_CARE_PROVIDER_SITE_OTHER): Payer: BLUE CROSS/BLUE SHIELD | Admitting: Family Medicine

## 2015-12-09 VITALS — BP 144/82 | HR 74 | Temp 98.9°F | Wt 193.0 lb

## 2015-12-09 DIAGNOSIS — F32A Depression, unspecified: Secondary | ICD-10-CM

## 2015-12-09 DIAGNOSIS — I10 Essential (primary) hypertension: Secondary | ICD-10-CM | POA: Diagnosis not present

## 2015-12-09 DIAGNOSIS — F329 Major depressive disorder, single episode, unspecified: Secondary | ICD-10-CM

## 2015-12-09 MED ORDER — FLUOXETINE HCL 20 MG PO TABS: 40.0000 mg | ORAL_TABLET | Freq: Every day | ORAL | Status: DC

## 2015-12-09 NOTE — Progress Notes (Signed)
Garrett Reddish, MD  Subjective:  Garrett [name]. is a 40 y.o. year old very pleasant male patient who presents for/with See problem oriented charting ROS- No chest pain or shortness of breath. No headache or blurry vision.   Past Medical History-  Patient Active Problem List   Diagnosis Date Noted  . Hypertension 02/19/2014    Priority: Medium  . IBS (irritable bowel syndrome) diarrhea predominant 08/14/2012    Priority: Medium  . Allergic rhinitis 02/21/2012    Priority: Medium  . Hyperlipidemia 09/05/2007    Priority: Medium  . Depression 05/25/2007    Priority: Medium  . GERD (gastroesophageal reflux disease) 11/29/2012    Priority: Low    Medications- reviewed and updated Current Outpatient Prescriptions  Medication Sig Dispense Refill  . Alpha-D-Galactosidase (BEANO PO) Take by mouth as needed.    Marland Kitchen azelastine (ASTELIN) 137 MCG/SPRAY nasal spray 1 spray by Nasal route 2 (two) times daily. Use in each nostril as directed     . budesonide (RHINOCORT AQUA) 32 MCG/ACT nasal spray 1 spray by Nasal route daily.      . Calcium Carbonate Antacid (TUMS PO) Take by mouth as needed.    . colestipol (MICRONIZED COLESTIPOL HCL) 1 G tablet Take 2 tablets (2 g total) by mouth 2 (two) times daily. 360 tablet 0  . desloratadine (CLARINEX) 5 MG tablet Take 5 mg by mouth daily.      Marland Kitchen Flavoring Agent (PEPPERMINT FLAVOR) OIL Take 1 capsule by mouth daily.    Marland Kitchen FLUoxetine (PROZAC) 20 MG tablet Take 2 tablets (40 mg total) by mouth daily. 60 tablet 3  . lisinopril (PRINIVIL,ZESTRIL) 20 MG tablet TAKE ONE TABLET BY MOUTH ONCE DAILY 90 tablet 2  . Loperamide HCl (IMODIUM A-D PO) Take by mouth as needed.    . NON FORMULARY Allergy shots weekly    . Omega-3 Fatty Acids (FISH OIL) 1200 MG CAPS Take 1 capsule by mouth daily.    Marland Kitchen omeprazole (PRILOSEC) 20 MG capsule TAKE ONE CAPSULE BY MOUTH ONCE DAILY 30  TO  60  MINUTES  BEFORE  BREAKFAST 90 capsule 3  . pravastatin (PRAVACHOL) 40 MG tablet TAKE  ONE TABLET BY MOUTH ONCE DAILY 90 tablet 2  . hyoscyamine (LEVSIN SL) 0.125 MG SL tablet Place 1 tablet (0.125 mg total) under the tongue every 4 (four) hours as needed. (Patient not taking: Reported on 10/28/2015) 60 tablet 5   No current facility-administered medications for this visit.    Objective: BP 144/82 mmHg  Pulse 74  Temp(Src) 98.9 F (37.2 C)  Wt 193 lb (87.544 kg) Gen: NAD, resting comfortably CV: RRR no murmurs rubs or gallops Lungs: CTAB no crackles, wheeze, rhonchi Ext: no edema Skin: warm, dry Neuro: grossly normal, moves all extremities Psych: nondepressed mood  Assessment/Plan:  Depression S: Patient states he feels his depression is stable to slightly better, but notes improvement in anxiety. PHQ9 originally 12 now up to 15 but no SI/HI. GAD7 of 7 down to 2 at this point. Still not interested in counseling A/P: Poor control increase to 40mg . Follow up 6 weeks. Discussed counseling again and declines- agrees to consider if we are not able to achieve control with maxed out SSRI.   Hypertension S: poorly controlled  on lisinopril 20mg   BP Readings from Last 3 Encounters:  12/09/15 144/82  10/28/15 128/88  07/25/15 128/64  A/P:Continue current meds. Weight slipped up 10 lbs over 4 months- discussed improving diet and really getting on exercise (  did not after last visit) and following up in 6 weeks. Consider lisinopril 40mg  if not controlled   6 weeks  Meds ordered this encounter  Medications  . FLUoxetine (PROZAC) 20 MG tablet    Sig: Take 2 tablets (40 mg total) by mouth daily.    Dispense:  60 tablet    Refill:  3

## 2015-12-09 NOTE — Assessment & Plan Note (Signed)
S: poorly controlled  on lisinopril 20mg   BP Readings from Last 3 Encounters:  12/09/15 144/82  10/28/15 128/88  07/25/15 128/64  A/P:Continue current meds. Weight slipped up 10 lbs over 4 months- discussed improving diet and really getting on exercise (did not after last visit) and following up in 6 weeks. Consider lisinopril 40mg  if not controlled

## 2015-12-09 NOTE — Assessment & Plan Note (Signed)
S: Patient states he feels his depression is stable to slightly better, but notes improvement in anxiety. PHQ9 originally 12 now up to 15 but no SI/HI. GAD7 of 7 down to 2 at this point. Still not interested in counseling A/P: Poor control increase to 40mg . Follow up 6 weeks. Discussed counseling again and declines- agrees to consider if we are not able to achieve control with maxed out SSRI.

## 2015-12-09 NOTE — Patient Instructions (Addendum)
Increase prozac to 2 pills a day (40mg  total). Follow up in 6 weeks. Could also consider counseling as future step.   If any thoughts of hurting yourself call us immediately at [phone] or call 911.   Blood pressure up some- repeat at follow up. Hopefully can be more active and eat a little better and reverse weight trend. Wt Readings from Last 3 Encounters:  12/09/15 193 lb (87.544 kg)  10/28/15 191 lb (86.637 kg)  07/25/15 183 lb 2 oz (83.065 kg)

## 2015-12-29 ENCOUNTER — Other Ambulatory Visit: Payer: Self-pay | Admitting: Family Medicine

## 2016-01-19 ENCOUNTER — Ambulatory Visit: Payer: BLUE CROSS/BLUE SHIELD | Admitting: Family Medicine

## 2016-01-23 ENCOUNTER — Ambulatory Visit: Payer: BLUE CROSS/BLUE SHIELD | Admitting: Family Medicine

## 2016-02-06 ENCOUNTER — Encounter: Payer: Self-pay | Admitting: Family Medicine

## 2016-02-06 ENCOUNTER — Ambulatory Visit (INDEPENDENT_AMBULATORY_CARE_PROVIDER_SITE_OTHER): Payer: BLUE CROSS/BLUE SHIELD | Admitting: Family Medicine

## 2016-02-06 VITALS — BP 144/78 | HR 99 | Temp 99.3°F | Resp 20 | Ht 67.0 in | Wt 191.0 lb

## 2016-02-06 DIAGNOSIS — I10 Essential (primary) hypertension: Secondary | ICD-10-CM | POA: Diagnosis not present

## 2016-02-06 MED ORDER — LISINOPRIL 40 MG PO TABS: 40.0000 mg | ORAL_TABLET | Freq: Every day | ORAL | Status: DC

## 2016-02-06 NOTE — Patient Instructions (Addendum)
Increase lisinopril to 40mg  (ok to take 2 of the old ones until you run out after you have picked up new bigger pill).   Great job losing 2 lbs and exercising more. I think walking 5 days a week may give you even more benefit. Dash diet can help as well with blood pressure and weight  In regards to your energy level: 1. Let's try to get 8 hours of GOOD sleep- get out of bed and read. No tv or phone an hour before bed.  2. Up exercise 3. Consider repeat labwork at follow up if not improving  Glad the depression is doing better!   DASH Eating Plan DASH stands for "Dietary Approaches to Stop Hypertension." The DASH eating plan is a healthy eating plan that has been shown to reduce high blood pressure (hypertension). Additional health benefits may include reducing the risk of type 2 diabetes mellitus, heart disease, and stroke. The DASH eating plan may also help with weight loss. WHAT DO I NEED TO KNOW ABOUT THE DASH EATING PLAN? For the DASH eating plan, you will follow these general guidelines:  Choose foods with a percent daily value for sodium of less than 5% (as listed on the food label).  Use salt-free seasonings or herbs instead of table salt or sea salt.  Check with your health care provider or pharmacist before using salt substitutes.  Eat lower-sodium products, often labeled as "lower sodium" or "no salt added."  Eat fresh foods.  Eat more vegetables, fruits, and low-fat dairy products.  Choose whole grains. Look for the word "whole" as the first word in the ingredient list.  Choose fish and skinless chicken or [place] more often than red meat. Limit fish, poultry, and meat to 6 oz (170 g) each day.  Limit sweets, desserts, sugars, and sugary drinks.  Choose heart-healthy fats.  Limit cheese to 1 oz (28 g) per day.  Eat more home-cooked food and less restaurant, buffet, and fast food.  Limit fried foods.  Cook foods using methods other than frying.  Limit canned  vegetables. If you do use them, rinse them well to decrease the sodium.  When eating at a restaurant, ask that your food be prepared with less salt, or no salt if possible. WHAT FOODS CAN I EAT? Seek help from a dietitian for individual calorie needs. Grains Whole grain or whole wheat bread. Brown rice. Whole grain or whole wheat pasta. Quinoa, bulgur, and whole grain cereals. Low-sodium cereals. Corn or whole wheat flour tortillas. Whole grain cornbread. Whole grain crackers. Low-sodium crackers. Vegetables Fresh or frozen vegetables (raw, steamed, roasted, or grilled). Low-sodium or reduced-sodium tomato and vegetable juices. Low-sodium or reduced-sodium tomato sauce and paste. Low-sodium or reduced-sodium canned vegetables.  Fruits All fresh, canned (in natural juice), or frozen fruits. Meat and Other Protein Products Ground beef (85% or leaner), grass-fed beef, or beef trimmed of fat. Skinless chicken or [place]. Ground chicken or [place]. Pork trimmed of fat. All fish and seafood. Eggs. Dried beans, peas, or lentils. Unsalted nuts and seeds. Unsalted canned beans. Dairy Low-fat dairy products, such as skim or 1% milk, 2% or reduced-fat cheeses, low-fat ricotta or cottage cheese, or plain low-fat yogurt. Low-sodium or reduced-sodium cheeses. Fats and Oils Tub margarines without trans fats. Light or reduced-fat mayonnaise and salad dressings (reduced sodium). Avocado. Safflower, olive, or canola oils. Natural peanut or almond butter. Other Unsalted popcorn and pretzels. The items listed above may not be a complete list of recommended foods or beverages.  Contact your dietitian for more options. WHAT FOODS ARE NOT RECOMMENDED? Grains White bread. White pasta. White rice. Refined cornbread. Bagels and croissants. Crackers that contain trans fat. Vegetables Creamed or fried vegetables. Vegetables in a cheese sauce. Regular canned vegetables. Regular canned tomato sauce and paste. Regular tomato  and vegetable juices. Fruits Dried fruits. Canned fruit in light or heavy syrup. Fruit juice. Meat and Other Protein Products Fatty cuts of meat. Ribs, chicken wings, bacon, sausage, bologna, salami, chitterlings, fatback, hot dogs, bratwurst, and packaged luncheon meats. Salted nuts and seeds. Canned beans with salt. Dairy Whole or 2% milk, cream, half-and-half, and cream cheese. Whole-fat or sweetened yogurt. Full-fat cheeses or blue cheese. Nondairy creamers and whipped toppings. Processed cheese, cheese spreads, or cheese curds. Condiments Onion and garlic salt, seasoned salt, table salt, and sea salt. Canned and packaged gravies. Worcestershire sauce. Tartar sauce. Barbecue sauce. Teriyaki sauce. Soy sauce, including reduced sodium. Steak sauce. Fish sauce. Oyster sauce. Cocktail sauce. Horseradish. Ketchup and mustard. Meat flavorings and tenderizers. Bouillon cubes. Hot sauce. Tabasco sauce. Marinades. Taco seasonings. Relishes. Fats and Oils Butter, stick margarine, lard, shortening, ghee, and bacon fat. Coconut, palm kernel, or palm oils. Regular salad dressings. Other Pickles and olives. Salted popcorn and pretzels. The items listed above may not be a complete list of foods and beverages to avoid. Contact your dietitian for more information. WHERE CAN I FIND MORE INFORMATION? National Heart, [employer], and [employer] Institute: travelstabloid.com   This information is not intended to replace advice given to you by your health care provider. Make sure you discuss any questions you have with your health care provider.   Document Released: 10/28/2011 Document Revised: 11/29/2014 Document Reviewed: 09/12/2013 Elsevier Interactive Patient Education [employer].

## 2016-02-06 NOTE — Progress Notes (Signed)
Garrett Reddish, MD  Subjective:  Garrett Lade Geof Pimenta. is a 40 y.o. year old very pleasant male patient who presents for/with See problem oriented charting ROS- No chest pain or shortness of breath. No headache or blurry vision.   Past Medical History-  Patient Active Problem List   Diagnosis Date Noted  . Hypertension 02/19/2014    Priority: Medium  . IBS (irritable bowel syndrome) diarrhea predominant 08/14/2012    Priority: Medium  . Allergic rhinitis 02/21/2012    Priority: Medium  . Hyperlipidemia 09/05/2007    Priority: Medium  . Depression 05/25/2007    Priority: Medium  . GERD (gastroesophageal reflux disease) 11/29/2012    Priority: Low    Medications- reviewed and updated Current Outpatient Prescriptions  Medication Sig Dispense Refill  . Alpha-D-Galactosidase (BEANO PO) Take by mouth as needed.    Marland Kitchen azelastine (ASTELIN) 137 MCG/SPRAY nasal spray 1 spray by Nasal route 2 (two) times daily. Use in each nostril as directed     . budesonide (RHINOCORT AQUA) 32 MCG/ACT nasal spray 1 spray by Nasal route daily.      . Calcium Carbonate Antacid (TUMS PO) Take by mouth as needed.    . colestipol (MICRONIZED COLESTIPOL HCL) 1 G tablet Take 2 tablets (2 g total) by mouth 2 (two) times daily. 360 tablet 0  . desloratadine (CLARINEX) 5 MG tablet Take 5 mg by mouth daily.      Marland Kitchen Flavoring Agent (PEPPERMINT FLAVOR) OIL Take 1 capsule by mouth daily.    Marland Kitchen FLUoxetine (PROZAC) 20 MG tablet Take 2 tablets (40 mg total) by mouth daily. 60 tablet 3  . hyoscyamine (LEVSIN SL) 0.125 MG SL tablet Place 1 tablet (0.125 mg total) under the tongue every 4 (four) hours as needed. 60 tablet 5  . lisinopril (PRINIVIL,ZESTRIL) 40 MG tablet Take 1 tablet (40 mg total) by mouth daily. 30 tablet 5  . Loperamide HCl (IMODIUM A-D PO) Take by mouth as needed.    . NON FORMULARY Allergy shots weekly    . Omega-3 Fatty Acids (FISH OIL) 1200 MG CAPS Take 1 capsule by mouth daily.    Marland Kitchen omeprazole (PRILOSEC) 20  MG capsule TAKE ONE CAPSULE BY MOUTH ONCE DAILY 30  TO  60  MINUTES  BEFORE  BREAKFAST 90 capsule 3  . pravastatin (PRAVACHOL) 40 MG tablet TAKE ONE TABLET BY MOUTH ONCE DAILY 90 tablet 2   No current facility-administered medications for this visit.    Objective: BP 144/78 mmHg  Pulse 99  Temp(Src) 99.3 F (37.4 C) (Oral)  Resp 20  Ht 5\' 7"  (1.702 m)  Wt 191 lb (86.637 kg)  BMI 29.91 kg/m2  SpO2 98% Gen: NAD, resting comfortably CV: RRR no murmurs rubs or gallops Lungs: CTAB no crackles, wheeze, rhonchi Abdomen: soft/nontender/nondistended/normal bowel sounds.  Ext: no edema Skin: warm, dry Neuro: grossly normal, moves all extremities, wears glasses  Assessment/Plan:  Hypertension S: uncontrolled on lisinopril 20mg  despite increase in exercise and 2 lbs weight loss BP Readings from Last 3 Encounters:  02/06/16 144/78  12/09/15 144/82  10/28/15 128/88  A/P:Continue current meds:  But increase to 40mg  with 6 week follow up (he may just keep June appointment as well). Gave dash handout. Push exercise to 5 days a week (current 3 days a week 6 minutes  also asks me about some fatigue- though slightly improved with improved depression control he feels more tired than he thinks he should. In bed about 8 hours but poor sleep hygiene-  will maximize per avs. Up exercise to 5 days a week. Consider repeat labs including vitamin d at follow up if not improve.d Not likely testosterone- male pattern baldness and still gets morning erections.   Return in about 6 weeks (around 03/19/2016). Return precautions advised.   Meds ordered this encounter  Medications  . lisinopril (PRINIVIL,ZESTRIL) 40 MG tablet    Sig: Take 1 tablet (40 mg total) by mouth daily.    Dispense:  30 tablet    Refill:  5   The duration of face-to-face time during this visit was 20 minutes. Greater than 50% of this time was spent in counseling, explanation of diagnosis, planning of further management, and/or  coordination of care.

## 2016-02-06 NOTE — Assessment & Plan Note (Addendum)
S: uncontrolled on lisinopril 20mg  despite increase in exercise and 2 lbs weight loss BP Readings from Last 3 Encounters:  02/06/16 144/78  12/09/15 144/82  10/28/15 128/88  A/P:Continue current meds:  But increase to 40mg  with 6 week follow up (he may just keep June appointment as well). Gave dash handout. Push exercise to 5 days a week (current 3 days a week 6 minutes

## 2016-02-06 NOTE — Progress Notes (Signed)
Pre visit review using our clinic review tool, if applicable. No additional management support is needed unless otherwise documented below in the visit note. 

## 2016-02-17 ENCOUNTER — Other Ambulatory Visit: Payer: Self-pay | Admitting: Internal Medicine

## 2016-03-01 DIAGNOSIS — J3089 Other allergic rhinitis: Secondary | ICD-10-CM | POA: Diagnosis not present

## 2016-03-01 DIAGNOSIS — J3081 Allergic rhinitis due to animal (cat) (dog) hair and dander: Secondary | ICD-10-CM | POA: Diagnosis not present

## 2016-03-01 DIAGNOSIS — J301 Allergic rhinitis due to pollen: Secondary | ICD-10-CM | POA: Diagnosis not present

## 2016-03-02 DIAGNOSIS — J3089 Other allergic rhinitis: Secondary | ICD-10-CM | POA: Diagnosis not present

## 2016-03-08 DIAGNOSIS — J3081 Allergic rhinitis due to animal (cat) (dog) hair and dander: Secondary | ICD-10-CM | POA: Diagnosis not present

## 2016-03-08 DIAGNOSIS — J3089 Other allergic rhinitis: Secondary | ICD-10-CM | POA: Diagnosis not present

## 2016-03-08 DIAGNOSIS — J301 Allergic rhinitis due to pollen: Secondary | ICD-10-CM | POA: Diagnosis not present

## 2016-03-10 DIAGNOSIS — J3089 Other allergic rhinitis: Secondary | ICD-10-CM | POA: Diagnosis not present

## 2016-03-10 DIAGNOSIS — J301 Allergic rhinitis due to pollen: Secondary | ICD-10-CM | POA: Diagnosis not present

## 2016-03-10 DIAGNOSIS — J3081 Allergic rhinitis due to animal (cat) (dog) hair and dander: Secondary | ICD-10-CM | POA: Diagnosis not present

## 2016-03-15 DIAGNOSIS — J3081 Allergic rhinitis due to animal (cat) (dog) hair and dander: Secondary | ICD-10-CM | POA: Diagnosis not present

## 2016-03-15 DIAGNOSIS — J301 Allergic rhinitis due to pollen: Secondary | ICD-10-CM | POA: Diagnosis not present

## 2016-03-15 DIAGNOSIS — J3089 Other allergic rhinitis: Secondary | ICD-10-CM | POA: Diagnosis not present

## 2016-03-17 DIAGNOSIS — J3089 Other allergic rhinitis: Secondary | ICD-10-CM | POA: Diagnosis not present

## 2016-03-17 DIAGNOSIS — J301 Allergic rhinitis due to pollen: Secondary | ICD-10-CM | POA: Diagnosis not present

## 2016-03-17 DIAGNOSIS — J3081 Allergic rhinitis due to animal (cat) (dog) hair and dander: Secondary | ICD-10-CM | POA: Diagnosis not present

## 2016-03-22 DIAGNOSIS — J3089 Other allergic rhinitis: Secondary | ICD-10-CM | POA: Diagnosis not present

## 2016-03-22 DIAGNOSIS — J3081 Allergic rhinitis due to animal (cat) (dog) hair and dander: Secondary | ICD-10-CM | POA: Diagnosis not present

## 2016-03-22 DIAGNOSIS — J301 Allergic rhinitis due to pollen: Secondary | ICD-10-CM | POA: Diagnosis not present

## 2016-03-31 DIAGNOSIS — J301 Allergic rhinitis due to pollen: Secondary | ICD-10-CM | POA: Diagnosis not present

## 2016-03-31 DIAGNOSIS — J3089 Other allergic rhinitis: Secondary | ICD-10-CM | POA: Diagnosis not present

## 2016-03-31 DIAGNOSIS — J3081 Allergic rhinitis due to animal (cat) (dog) hair and dander: Secondary | ICD-10-CM | POA: Diagnosis not present

## 2016-04-05 DIAGNOSIS — J301 Allergic rhinitis due to pollen: Secondary | ICD-10-CM | POA: Diagnosis not present

## 2016-04-05 DIAGNOSIS — J3081 Allergic rhinitis due to animal (cat) (dog) hair and dander: Secondary | ICD-10-CM | POA: Diagnosis not present

## 2016-04-05 DIAGNOSIS — J3089 Other allergic rhinitis: Secondary | ICD-10-CM | POA: Diagnosis not present

## 2016-04-12 ENCOUNTER — Other Ambulatory Visit: Payer: Self-pay | Admitting: Family Medicine

## 2016-04-16 DIAGNOSIS — J3089 Other allergic rhinitis: Secondary | ICD-10-CM | POA: Diagnosis not present

## 2016-04-16 DIAGNOSIS — J3081 Allergic rhinitis due to animal (cat) (dog) hair and dander: Secondary | ICD-10-CM | POA: Diagnosis not present

## 2016-04-16 DIAGNOSIS — J301 Allergic rhinitis due to pollen: Secondary | ICD-10-CM | POA: Diagnosis not present

## 2016-04-20 DIAGNOSIS — J3089 Other allergic rhinitis: Secondary | ICD-10-CM | POA: Diagnosis not present

## 2016-04-20 DIAGNOSIS — J3081 Allergic rhinitis due to animal (cat) (dog) hair and dander: Secondary | ICD-10-CM | POA: Diagnosis not present

## 2016-04-20 DIAGNOSIS — J301 Allergic rhinitis due to pollen: Secondary | ICD-10-CM | POA: Diagnosis not present

## 2016-04-27 ENCOUNTER — Ambulatory Visit (INDEPENDENT_AMBULATORY_CARE_PROVIDER_SITE_OTHER): Payer: BLUE CROSS/BLUE SHIELD | Admitting: Family Medicine

## 2016-04-27 ENCOUNTER — Encounter: Payer: Self-pay | Admitting: Family Medicine

## 2016-04-27 VITALS — BP 124/86 | HR 84 | Temp 98.1°F | Ht 67.0 in | Wt 191.0 lb

## 2016-04-27 DIAGNOSIS — J3089 Other allergic rhinitis: Secondary | ICD-10-CM | POA: Diagnosis not present

## 2016-04-27 DIAGNOSIS — I10 Essential (primary) hypertension: Secondary | ICD-10-CM

## 2016-04-27 DIAGNOSIS — E785 Hyperlipidemia, unspecified: Secondary | ICD-10-CM | POA: Diagnosis not present

## 2016-04-27 DIAGNOSIS — J3081 Allergic rhinitis due to animal (cat) (dog) hair and dander: Secondary | ICD-10-CM | POA: Diagnosis not present

## 2016-04-27 DIAGNOSIS — J301 Allergic rhinitis due to pollen: Secondary | ICD-10-CM | POA: Diagnosis not present

## 2016-04-27 NOTE — Progress Notes (Signed)
Subjective:  Garrett [name]. is a 40 y.o. year old very pleasant male patient who presents for/with See problem oriented charting ROS- No chest pain or shortness of breath. No headache or blurry vision.see any ROS included in HPI as well.   Past Medical History-  Patient Active Problem List   Diagnosis Date Noted  . Hypertension 02/19/2014    Priority: Medium  . IBS (irritable bowel syndrome) diarrhea predominant 08/14/2012    Priority: Medium  . Allergic rhinitis 02/21/2012    Priority: Medium  . Hyperlipidemia 09/05/2007    Priority: Medium  . Depression 05/25/2007    Priority: Medium  . GERD (gastroesophageal reflux disease) 11/29/2012    Priority: Low    Medications- reviewed and updated Current Outpatient Prescriptions  Medication Sig Dispense Refill  . Alpha-D-Galactosidase (BEANO PO) Take by mouth as needed.    Marland Kitchen azelastine (ASTELIN) 137 MCG/SPRAY nasal spray 1 spray by Nasal route 2 (two) times daily. Use in each nostril as directed     . budesonide (RHINOCORT AQUA) 32 MCG/ACT nasal spray 1 spray by Nasal route daily.      . Calcium Carbonate Antacid (TUMS PO) Take by mouth as needed.    . colestipol (COLESTID) 1 g tablet TAKE TWO TABLETS BY MOUTH TWICE DAILY 360 tablet 0  . desloratadine (CLARINEX) 5 MG tablet Take 5 mg by mouth daily.      Marland Kitchen Flavoring Agent (PEPPERMINT FLAVOR) OIL Take 1 capsule by mouth daily.    Marland Kitchen FLUoxetine (PROZAC) 20 MG tablet TAKE TWO TABLETS BY MOUTH ONCE DAILY 60 tablet 2  . hyoscyamine (LEVSIN SL) 0.125 MG SL tablet Place 1 tablet (0.125 mg total) under the tongue every 4 (four) hours as needed. 60 tablet 5  . lisinopril (PRINIVIL,ZESTRIL) 40 MG tablet Take 1 tablet (40 mg total) by mouth daily. 30 tablet 5  . Loperamide HCl (IMODIUM A-D PO) Take by mouth as needed.    . NON FORMULARY Allergy shots weekly    . Omega-3 Fatty Acids (FISH OIL) 1200 MG CAPS Take 1 capsule by mouth daily.    Marland Kitchen omeprazole (PRILOSEC) 20 MG capsule TAKE ONE CAPSULE  BY MOUTH ONCE DAILY 30  TO  60  MINUTES  BEFORE  BREAKFAST 90 capsule 3  . pravastatin (PRAVACHOL) 40 MG tablet TAKE ONE TABLET BY MOUTH ONCE DAILY 90 tablet 2   No current facility-administered medications for this visit.    Objective: BP 124/86 mmHg  Pulse 84  Temp(Src) 98.1 F (36.7 C) (Oral)  Ht 5\' 7"  (1.702 m)  Wt 191 lb (86.637 kg)  BMI 29.91 kg/m2 Gen: NAD, resting comfortably CV: RRR no murmurs rubs or gallops Lungs: CTAB no crackles, wheeze, rhonchi Abdomen: soft/nontender/nondistended/normal bowel sounds. No rebound or guarding.  Ext: no edema Skin: warm, dry Neuro: grossly normal, moves all extremities  Assessment/Plan:  Hypertension S: improved control on lisinopril 40mg  up from 20mg . Weight stable- had advised weight loss. Averaging 123/77- has not noted a changed on home readings with increase but in office has improved.  BP Readings from Last 3 Encounters:  04/27/16 124/86  02/06/16 144/78  12/09/15 144/82  A/P:Continue current meds:  At 40mg  dose. Encouraged to restart the walking and decrease portion size-continue to work on weight loss  Hyperlipidemia S: well controlled on pravastatin 40mg . No myalgias.  Lab Results  Component Value Date   CHOL 129 10/23/2015   HDL 41.30 10/23/2015   LDLCALC 63 10/23/2015   LDLDIRECT 122.9 04/18/2008   TRIG  123.0 10/23/2015   CHOLHDL 3 10/23/2015   A/P: continue med. Encouraged need for healthy eating, regular exercise, weight loss.  Return in about 6 months (around 10/27/2016) for physical with labs a few days before but in december still. Return precautions advised.   [name], MD

## 2016-04-27 NOTE — Assessment & Plan Note (Signed)
S: improved control on lisinopril 40mg  up from 20mg . Weight stable- had advised weight loss. Averaging 123/77- has not noted a changed on home readings with increase but in office has improved.  BP Readings from Last 3 Encounters:  04/27/16 124/86  02/06/16 144/78  12/09/15 144/82  A/P:Continue current meds:  At 40mg  dose. Encouraged to restart the walking and decrease portion size-continue to work on weight loss

## 2016-04-27 NOTE — Patient Instructions (Signed)
Blood pressure looks much better. Continue lisinopril 40mg . Slightly odd that home #s havent changed much but glad in officehas improved  Weight has stabilized- let's continue efforts to lose weight by increasing exercise and decreasing portion size  Glad depression is doing well- continue current medicine

## 2016-05-03 DIAGNOSIS — J3089 Other allergic rhinitis: Secondary | ICD-10-CM | POA: Diagnosis not present

## 2016-05-03 DIAGNOSIS — J3081 Allergic rhinitis due to animal (cat) (dog) hair and dander: Secondary | ICD-10-CM | POA: Diagnosis not present

## 2016-05-03 DIAGNOSIS — J301 Allergic rhinitis due to pollen: Secondary | ICD-10-CM | POA: Diagnosis not present

## 2016-05-04 DIAGNOSIS — J3089 Other allergic rhinitis: Secondary | ICD-10-CM | POA: Diagnosis not present

## 2016-05-10 ENCOUNTER — Other Ambulatory Visit: Payer: Self-pay | Admitting: Internal Medicine

## 2016-05-10 DIAGNOSIS — J3089 Other allergic rhinitis: Secondary | ICD-10-CM | POA: Diagnosis not present

## 2016-05-10 DIAGNOSIS — J301 Allergic rhinitis due to pollen: Secondary | ICD-10-CM | POA: Diagnosis not present

## 2016-05-10 DIAGNOSIS — J3081 Allergic rhinitis due to animal (cat) (dog) hair and dander: Secondary | ICD-10-CM | POA: Diagnosis not present

## 2016-05-17 DIAGNOSIS — J301 Allergic rhinitis due to pollen: Secondary | ICD-10-CM | POA: Diagnosis not present

## 2016-05-17 DIAGNOSIS — J3089 Other allergic rhinitis: Secondary | ICD-10-CM | POA: Diagnosis not present

## 2016-05-17 DIAGNOSIS — J3081 Allergic rhinitis due to animal (cat) (dog) hair and dander: Secondary | ICD-10-CM | POA: Diagnosis not present

## 2016-05-21 DIAGNOSIS — J3081 Allergic rhinitis due to animal (cat) (dog) hair and dander: Secondary | ICD-10-CM | POA: Diagnosis not present

## 2016-05-21 DIAGNOSIS — J3089 Other allergic rhinitis: Secondary | ICD-10-CM | POA: Diagnosis not present

## 2016-05-21 DIAGNOSIS — J301 Allergic rhinitis due to pollen: Secondary | ICD-10-CM | POA: Diagnosis not present

## 2016-05-26 DIAGNOSIS — J301 Allergic rhinitis due to pollen: Secondary | ICD-10-CM | POA: Diagnosis not present

## 2016-05-26 DIAGNOSIS — J3089 Other allergic rhinitis: Secondary | ICD-10-CM | POA: Diagnosis not present

## 2016-05-26 DIAGNOSIS — J3081 Allergic rhinitis due to animal (cat) (dog) hair and dander: Secondary | ICD-10-CM | POA: Diagnosis not present

## 2016-06-01 DIAGNOSIS — H1013 Acute atopic conjunctivitis, bilateral: Secondary | ICD-10-CM | POA: Diagnosis not present

## 2016-06-02 DIAGNOSIS — J301 Allergic rhinitis due to pollen: Secondary | ICD-10-CM | POA: Diagnosis not present

## 2016-06-02 DIAGNOSIS — J3081 Allergic rhinitis due to animal (cat) (dog) hair and dander: Secondary | ICD-10-CM | POA: Diagnosis not present

## 2016-06-02 DIAGNOSIS — J3089 Other allergic rhinitis: Secondary | ICD-10-CM | POA: Diagnosis not present

## 2016-06-04 DIAGNOSIS — J3081 Allergic rhinitis due to animal (cat) (dog) hair and dander: Secondary | ICD-10-CM | POA: Diagnosis not present

## 2016-06-04 DIAGNOSIS — J3089 Other allergic rhinitis: Secondary | ICD-10-CM | POA: Diagnosis not present

## 2016-06-04 DIAGNOSIS — J301 Allergic rhinitis due to pollen: Secondary | ICD-10-CM | POA: Diagnosis not present

## 2016-06-08 DIAGNOSIS — J3081 Allergic rhinitis due to animal (cat) (dog) hair and dander: Secondary | ICD-10-CM | POA: Diagnosis not present

## 2016-06-08 DIAGNOSIS — J301 Allergic rhinitis due to pollen: Secondary | ICD-10-CM | POA: Diagnosis not present

## 2016-06-08 DIAGNOSIS — J3089 Other allergic rhinitis: Secondary | ICD-10-CM | POA: Diagnosis not present

## 2016-06-14 DIAGNOSIS — J301 Allergic rhinitis due to pollen: Secondary | ICD-10-CM | POA: Diagnosis not present

## 2016-06-14 DIAGNOSIS — J3081 Allergic rhinitis due to animal (cat) (dog) hair and dander: Secondary | ICD-10-CM | POA: Diagnosis not present

## 2016-06-14 DIAGNOSIS — J3089 Other allergic rhinitis: Secondary | ICD-10-CM | POA: Diagnosis not present

## 2016-06-21 DIAGNOSIS — J301 Allergic rhinitis due to pollen: Secondary | ICD-10-CM | POA: Diagnosis not present

## 2016-06-21 DIAGNOSIS — J3089 Other allergic rhinitis: Secondary | ICD-10-CM | POA: Diagnosis not present

## 2016-06-21 DIAGNOSIS — J3081 Allergic rhinitis due to animal (cat) (dog) hair and dander: Secondary | ICD-10-CM | POA: Diagnosis not present

## 2016-06-30 DIAGNOSIS — J301 Allergic rhinitis due to pollen: Secondary | ICD-10-CM | POA: Diagnosis not present

## 2016-06-30 DIAGNOSIS — J3089 Other allergic rhinitis: Secondary | ICD-10-CM | POA: Diagnosis not present

## 2016-06-30 DIAGNOSIS — J3081 Allergic rhinitis due to animal (cat) (dog) hair and dander: Secondary | ICD-10-CM | POA: Diagnosis not present

## 2016-07-05 DIAGNOSIS — J301 Allergic rhinitis due to pollen: Secondary | ICD-10-CM | POA: Diagnosis not present

## 2016-07-05 DIAGNOSIS — J3081 Allergic rhinitis due to animal (cat) (dog) hair and dander: Secondary | ICD-10-CM | POA: Diagnosis not present

## 2016-07-05 DIAGNOSIS — J3089 Other allergic rhinitis: Secondary | ICD-10-CM | POA: Diagnosis not present

## 2016-07-13 ENCOUNTER — Other Ambulatory Visit: Payer: Self-pay | Admitting: Family Medicine

## 2016-07-14 DIAGNOSIS — J3089 Other allergic rhinitis: Secondary | ICD-10-CM | POA: Diagnosis not present

## 2016-07-14 DIAGNOSIS — J3081 Allergic rhinitis due to animal (cat) (dog) hair and dander: Secondary | ICD-10-CM | POA: Diagnosis not present

## 2016-07-14 DIAGNOSIS — J301 Allergic rhinitis due to pollen: Secondary | ICD-10-CM | POA: Diagnosis not present

## 2016-07-19 DIAGNOSIS — J3081 Allergic rhinitis due to animal (cat) (dog) hair and dander: Secondary | ICD-10-CM | POA: Diagnosis not present

## 2016-07-19 DIAGNOSIS — J3089 Other allergic rhinitis: Secondary | ICD-10-CM | POA: Diagnosis not present

## 2016-07-19 DIAGNOSIS — J301 Allergic rhinitis due to pollen: Secondary | ICD-10-CM | POA: Diagnosis not present

## 2016-07-26 ENCOUNTER — Other Ambulatory Visit: Payer: Self-pay | Admitting: Family Medicine

## 2016-07-27 DIAGNOSIS — J3081 Allergic rhinitis due to animal (cat) (dog) hair and dander: Secondary | ICD-10-CM | POA: Diagnosis not present

## 2016-07-27 DIAGNOSIS — J3089 Other allergic rhinitis: Secondary | ICD-10-CM | POA: Diagnosis not present

## 2016-07-27 DIAGNOSIS — J301 Allergic rhinitis due to pollen: Secondary | ICD-10-CM | POA: Diagnosis not present

## 2016-08-04 DIAGNOSIS — J3089 Other allergic rhinitis: Secondary | ICD-10-CM | POA: Diagnosis not present

## 2016-08-04 DIAGNOSIS — J301 Allergic rhinitis due to pollen: Secondary | ICD-10-CM | POA: Diagnosis not present

## 2016-08-04 DIAGNOSIS — J3081 Allergic rhinitis due to animal (cat) (dog) hair and dander: Secondary | ICD-10-CM | POA: Diagnosis not present

## 2016-08-09 ENCOUNTER — Other Ambulatory Visit: Payer: Self-pay | Admitting: Internal Medicine

## 2016-08-09 DIAGNOSIS — J3089 Other allergic rhinitis: Secondary | ICD-10-CM | POA: Diagnosis not present

## 2016-08-09 DIAGNOSIS — J3081 Allergic rhinitis due to animal (cat) (dog) hair and dander: Secondary | ICD-10-CM | POA: Diagnosis not present

## 2016-08-09 DIAGNOSIS — J301 Allergic rhinitis due to pollen: Secondary | ICD-10-CM | POA: Diagnosis not present

## 2016-08-16 DIAGNOSIS — J3081 Allergic rhinitis due to animal (cat) (dog) hair and dander: Secondary | ICD-10-CM | POA: Diagnosis not present

## 2016-08-16 DIAGNOSIS — J301 Allergic rhinitis due to pollen: Secondary | ICD-10-CM | POA: Diagnosis not present

## 2016-08-16 DIAGNOSIS — J3089 Other allergic rhinitis: Secondary | ICD-10-CM | POA: Diagnosis not present

## 2016-08-23 ENCOUNTER — Other Ambulatory Visit: Payer: Self-pay | Admitting: Internal Medicine

## 2016-08-23 DIAGNOSIS — J3089 Other allergic rhinitis: Secondary | ICD-10-CM | POA: Diagnosis not present

## 2016-08-23 DIAGNOSIS — J301 Allergic rhinitis due to pollen: Secondary | ICD-10-CM | POA: Diagnosis not present

## 2016-08-23 DIAGNOSIS — J3081 Allergic rhinitis due to animal (cat) (dog) hair and dander: Secondary | ICD-10-CM | POA: Diagnosis not present

## 2016-09-01 DIAGNOSIS — J301 Allergic rhinitis due to pollen: Secondary | ICD-10-CM | POA: Diagnosis not present

## 2016-09-01 DIAGNOSIS — J3089 Other allergic rhinitis: Secondary | ICD-10-CM | POA: Diagnosis not present

## 2016-09-01 DIAGNOSIS — J3081 Allergic rhinitis due to animal (cat) (dog) hair and dander: Secondary | ICD-10-CM | POA: Diagnosis not present

## 2016-09-06 DIAGNOSIS — J3081 Allergic rhinitis due to animal (cat) (dog) hair and dander: Secondary | ICD-10-CM | POA: Diagnosis not present

## 2016-09-06 DIAGNOSIS — J301 Allergic rhinitis due to pollen: Secondary | ICD-10-CM | POA: Diagnosis not present

## 2016-09-06 DIAGNOSIS — J3089 Other allergic rhinitis: Secondary | ICD-10-CM | POA: Diagnosis not present

## 2016-09-08 ENCOUNTER — Ambulatory Visit (INDEPENDENT_AMBULATORY_CARE_PROVIDER_SITE_OTHER): Payer: BLUE CROSS/BLUE SHIELD

## 2016-09-08 DIAGNOSIS — Z23 Encounter for immunization: Secondary | ICD-10-CM

## 2016-09-13 DIAGNOSIS — J301 Allergic rhinitis due to pollen: Secondary | ICD-10-CM | POA: Diagnosis not present

## 2016-09-13 DIAGNOSIS — J3089 Other allergic rhinitis: Secondary | ICD-10-CM | POA: Diagnosis not present

## 2016-09-13 DIAGNOSIS — J3081 Allergic rhinitis due to animal (cat) (dog) hair and dander: Secondary | ICD-10-CM | POA: Diagnosis not present

## 2016-09-17 DIAGNOSIS — J301 Allergic rhinitis due to pollen: Secondary | ICD-10-CM | POA: Diagnosis not present

## 2016-09-17 DIAGNOSIS — J3081 Allergic rhinitis due to animal (cat) (dog) hair and dander: Secondary | ICD-10-CM | POA: Diagnosis not present

## 2016-09-17 DIAGNOSIS — J3089 Other allergic rhinitis: Secondary | ICD-10-CM | POA: Diagnosis not present

## 2016-09-20 ENCOUNTER — Other Ambulatory Visit: Payer: Self-pay | Admitting: Family Medicine

## 2016-09-20 DIAGNOSIS — J301 Allergic rhinitis due to pollen: Secondary | ICD-10-CM | POA: Diagnosis not present

## 2016-09-20 DIAGNOSIS — J3089 Other allergic rhinitis: Secondary | ICD-10-CM | POA: Diagnosis not present

## 2016-09-20 DIAGNOSIS — J3081 Allergic rhinitis due to animal (cat) (dog) hair and dander: Secondary | ICD-10-CM | POA: Diagnosis not present

## 2016-09-27 DIAGNOSIS — J3089 Other allergic rhinitis: Secondary | ICD-10-CM | POA: Diagnosis not present

## 2016-09-27 DIAGNOSIS — J3081 Allergic rhinitis due to animal (cat) (dog) hair and dander: Secondary | ICD-10-CM | POA: Diagnosis not present

## 2016-09-27 DIAGNOSIS — J301 Allergic rhinitis due to pollen: Secondary | ICD-10-CM | POA: Diagnosis not present

## 2016-10-04 ENCOUNTER — Encounter: Payer: Self-pay | Admitting: Internal Medicine

## 2016-10-04 ENCOUNTER — Ambulatory Visit (INDEPENDENT_AMBULATORY_CARE_PROVIDER_SITE_OTHER): Payer: BLUE CROSS/BLUE SHIELD | Admitting: Internal Medicine

## 2016-10-04 VITALS — BP 126/78 | HR 84 | Ht 67.0 in | Wt 193.4 lb

## 2016-10-04 DIAGNOSIS — K580 Irritable bowel syndrome with diarrhea: Secondary | ICD-10-CM

## 2016-10-04 DIAGNOSIS — K219 Gastro-esophageal reflux disease without esophagitis: Secondary | ICD-10-CM | POA: Diagnosis not present

## 2016-10-04 DIAGNOSIS — E669 Obesity, unspecified: Secondary | ICD-10-CM | POA: Diagnosis not present

## 2016-10-04 NOTE — Assessment & Plan Note (Signed)
Stay on PPI - doing well  Weight loss could help

## 2016-10-04 NOTE — Assessment & Plan Note (Signed)
Doing well RTC GI PRN Try to get refills through PCP

## 2016-10-04 NOTE — Progress Notes (Signed)
   Tayvion Pessin. 40 y.o. August 13, 1976 [id number]  Assessment & Plan:   Encounter Diagnoses  Name Primary?  . Irritable bowel syndrome with diarrhea   . Gastroesophageal reflux disease, esophagitis presence not specified   . Obesity (BMI 30.0-34.9) Yes    Refills from Dr. Yong Channel if he is doing well and acceptable to PCP. I don't think he needs a specialist visit just for refills. I am certainly happy to see him back at any time that it is needed and I tried to reassure him about that today.   Weight loss goal 10# in 1 year is suggested. Be more but I told him to search for a diet he can live with. We discussed a few, perhaps Reubens. For dietitian referral. He should avoid high fiber type diets like weight watchers given his IBS-D.  I appreciate the opportunity to care for this patient. [id number] Yong Channel, MD   Subjective:   Chief Complaint: Follow-up of irritable bowel and reflux  HPI  A she has no complaints he says his IBS hemorrhoids and reflux symptoms are under control. Current medical regimen is working. He is concerned because he has been steadily gaining weight, he asks if he could increase colestipol if he needed to if any type of diet causes more loose stools. Medications, allergies, past medical history, past surgical history, family history and social history are reviewed and updated in the EMR.  Review of Systems As above  Objective:   Physical Exam BP 126/78   Pulse 84   Ht 5\' 7"  (1.702 m)   Wt 193 lb 6 oz (87.7 kg)   BMI 30.29 kg/m  No acute distress  15 minutes time spent with patient > half in counseling coordination of care

## 2016-10-04 NOTE — Patient Instructions (Signed)
As long as you are doing well, Dr. Yong Channel can refill your medications and you may follow up with Dr. Carlean Purl as needed.    Try to set a goal of losing 10lb over a year.

## 2016-10-05 DIAGNOSIS — J3081 Allergic rhinitis due to animal (cat) (dog) hair and dander: Secondary | ICD-10-CM | POA: Diagnosis not present

## 2016-10-05 DIAGNOSIS — J301 Allergic rhinitis due to pollen: Secondary | ICD-10-CM | POA: Diagnosis not present

## 2016-10-05 DIAGNOSIS — J3089 Other allergic rhinitis: Secondary | ICD-10-CM | POA: Diagnosis not present

## 2016-10-11 DIAGNOSIS — J301 Allergic rhinitis due to pollen: Secondary | ICD-10-CM | POA: Diagnosis not present

## 2016-10-11 DIAGNOSIS — J3089 Other allergic rhinitis: Secondary | ICD-10-CM | POA: Diagnosis not present

## 2016-10-11 DIAGNOSIS — J3081 Allergic rhinitis due to animal (cat) (dog) hair and dander: Secondary | ICD-10-CM | POA: Diagnosis not present

## 2016-10-18 DIAGNOSIS — J3089 Other allergic rhinitis: Secondary | ICD-10-CM | POA: Diagnosis not present

## 2016-10-18 DIAGNOSIS — J301 Allergic rhinitis due to pollen: Secondary | ICD-10-CM | POA: Diagnosis not present

## 2016-10-18 DIAGNOSIS — J3081 Allergic rhinitis due to animal (cat) (dog) hair and dander: Secondary | ICD-10-CM | POA: Diagnosis not present

## 2016-10-20 DIAGNOSIS — J3089 Other allergic rhinitis: Secondary | ICD-10-CM | POA: Diagnosis not present

## 2016-10-20 DIAGNOSIS — J301 Allergic rhinitis due to pollen: Secondary | ICD-10-CM | POA: Diagnosis not present

## 2016-10-20 DIAGNOSIS — J3081 Allergic rhinitis due to animal (cat) (dog) hair and dander: Secondary | ICD-10-CM | POA: Diagnosis not present

## 2016-10-25 DIAGNOSIS — J301 Allergic rhinitis due to pollen: Secondary | ICD-10-CM | POA: Diagnosis not present

## 2016-10-25 DIAGNOSIS — J3081 Allergic rhinitis due to animal (cat) (dog) hair and dander: Secondary | ICD-10-CM | POA: Diagnosis not present

## 2016-10-25 DIAGNOSIS — J3089 Other allergic rhinitis: Secondary | ICD-10-CM | POA: Diagnosis not present

## 2016-10-26 ENCOUNTER — Other Ambulatory Visit (INDEPENDENT_AMBULATORY_CARE_PROVIDER_SITE_OTHER): Payer: BLUE CROSS/BLUE SHIELD

## 2016-10-26 DIAGNOSIS — Z0000 Encounter for general adult medical examination without abnormal findings: Secondary | ICD-10-CM

## 2016-10-26 LAB — BASIC METABOLIC PANEL
BUN: 9 mg/dL (ref 6–23)
CALCIUM: 9.7 mg/dL (ref 8.4–10.5)
CHLORIDE: 102 meq/L (ref 96–112)
CO2: 27 mEq/L (ref 19–32)
CREATININE: 0.88 mg/dL (ref 0.40–1.50)
GFR: 101.49 mL/min (ref >60.00)
Glucose, Bld: 91 mg/dL (ref 70–99)
Potassium: 4.3 mEq/L (ref 3.5–5.1)
Sodium: 139 mEq/L (ref 135–145)

## 2016-10-26 LAB — LIPID PANEL
CHOL/HDL RATIO: 4
Cholesterol: 166 mg/dL (ref 0–200)
HDL: 42 mg/dL (ref >39.00)
LDL CALC: 85 mg/dL (ref 0–99)
NONHDL: 124.16
Triglycerides: 196 mg/dL — ABNORMAL HIGH (ref 0.0–149.0)
VLDL: 39.2 mg/dL (ref 0.0–40.0)

## 2016-10-26 LAB — CBC WITH DIFFERENTIAL/PLATELET
BASOS ABS: 0 10*3/uL (ref 0.0–0.1)
BASOS PCT: 0.4 % (ref 0.0–3.0)
EOS ABS: 0.2 10*3/uL (ref 0.0–0.7)
Eosinophils Relative: 3.2 % (ref 0.0–5.0)
HEMATOCRIT: 40.1 % (ref 39.0–52.0)
HEMOGLOBIN: 13.6 g/dL (ref 13.0–17.0)
LYMPHS PCT: 37 % (ref 12.0–46.0)
Lymphs Abs: 2.2 10*3/uL (ref 0.7–4.0)
MCHC: 34 g/dL (ref 30.0–36.0)
MCV: 83.2 fl (ref 78.0–100.0)
Monocytes Absolute: 0.5 10*3/uL (ref 0.1–1.0)
Monocytes Relative: 8 % (ref 3.0–12.0)
Neutro Abs: 3 10*3/uL (ref 1.4–7.7)
Neutrophils Relative %: 51.4 % (ref 43.0–77.0)
Platelets: 245 10*3/uL (ref 150.0–400.0)
RBC: 4.82 Mil/uL (ref 4.22–5.81)
RDW: 12.5 % (ref 11.5–15.5)
WBC: 5.8 10*3/uL (ref 4.0–10.5)

## 2016-10-26 LAB — POC URINALSYSI DIPSTICK (AUTOMATED)
Bilirubin, UA: NEGATIVE
Glucose, UA: NEGATIVE
KETONES UA: NEGATIVE
Leukocytes, UA: NEGATIVE
Nitrite, UA: NEGATIVE
PROTEIN UA: NEGATIVE
RBC UA: NEGATIVE
SPEC GRAV UA: 1.01
UROBILINOGEN UA: 0.2
pH, UA: 7

## 2016-10-26 LAB — HEPATIC FUNCTION PANEL
ALK PHOS: 56 U/L (ref 39–117)
ALT: 15 U/L (ref 0–53)
AST: 15 U/L (ref 0–37)
Albumin: 4.8 g/dL (ref 3.5–5.2)
BILIRUBIN DIRECT: 0.1 mg/dL (ref 0.0–0.3)
BILIRUBIN TOTAL: 0.7 mg/dL (ref 0.2–1.2)
TOTAL PROTEIN: 7.3 g/dL (ref 6.0–8.3)

## 2016-10-26 LAB — TSH: TSH: 1.56 u[IU]/mL (ref 0.35–4.50)

## 2016-10-28 DIAGNOSIS — J301 Allergic rhinitis due to pollen: Secondary | ICD-10-CM | POA: Diagnosis not present

## 2016-10-28 DIAGNOSIS — J3081 Allergic rhinitis due to animal (cat) (dog) hair and dander: Secondary | ICD-10-CM | POA: Diagnosis not present

## 2016-11-02 ENCOUNTER — Ambulatory Visit (INDEPENDENT_AMBULATORY_CARE_PROVIDER_SITE_OTHER): Payer: BLUE CROSS/BLUE SHIELD | Admitting: Family Medicine

## 2016-11-02 ENCOUNTER — Encounter: Payer: Self-pay | Admitting: Family Medicine

## 2016-11-02 VITALS — BP 136/72 | HR 88 | Temp 98.0°F | Ht 67.5 in | Wt 191.0 lb

## 2016-11-02 DIAGNOSIS — F321 Major depressive disorder, single episode, moderate: Secondary | ICD-10-CM

## 2016-11-02 DIAGNOSIS — K219 Gastro-esophageal reflux disease without esophagitis: Secondary | ICD-10-CM | POA: Diagnosis not present

## 2016-11-02 DIAGNOSIS — Z0001 Encounter for general adult medical examination with abnormal findings: Secondary | ICD-10-CM

## 2016-11-02 DIAGNOSIS — K589 Irritable bowel syndrome without diarrhea: Secondary | ICD-10-CM

## 2016-11-02 DIAGNOSIS — J301 Allergic rhinitis due to pollen: Secondary | ICD-10-CM | POA: Diagnosis not present

## 2016-11-02 DIAGNOSIS — I10 Essential (primary) hypertension: Secondary | ICD-10-CM

## 2016-11-02 DIAGNOSIS — J3081 Allergic rhinitis due to animal (cat) (dog) hair and dander: Secondary | ICD-10-CM | POA: Diagnosis not present

## 2016-11-02 DIAGNOSIS — J3089 Other allergic rhinitis: Secondary | ICD-10-CM | POA: Diagnosis not present

## 2016-11-02 DIAGNOSIS — E785 Hyperlipidemia, unspecified: Secondary | ICD-10-CM

## 2016-11-02 MED ORDER — ESCITALOPRAM OXALATE 20 MG PO TABS: 20.0000 mg | ORAL_TABLET | Freq: Every day | ORAL | 5 refills | Status: DC

## 2016-11-02 MED ORDER — COLESTIPOL HCL 1 G PO TABS: 2.0000 g | ORAL_TABLET | Freq: Two times a day (BID) | ORAL | 3 refills | Status: DC

## 2016-11-02 MED ORDER — HYOSCYAMINE SULFATE 0.125 MG SL SUBL: 0.1250 mg | SUBLINGUAL_TABLET | SUBLINGUAL | 5 refills | Status: AC | PRN

## 2016-11-02 MED ORDER — OMEPRAZOLE 20 MG PO CPDR: DELAYED_RELEASE_CAPSULE | ORAL | 3 refills | Status: DC

## 2016-11-02 NOTE — Assessment & Plan Note (Signed)
S: December 2013 colonoscopy with random biopsies negative. He has responded to colestipol, hyosyamine for cramping. Imodium for diarrhea. Diarrhea predominant. Was seeing GI yearly and he has been released at this time by Dr. Carlean Purl if I will assume rx  A/P: since he is doing well. Agreed to transition care to primary care. Rare hyoscyamine use. Colestipol 2 tablets twice a day- may contribute to high triglyceride--> I filled both meds for first time today (prior by Dr. Carlean Purl)

## 2016-11-02 NOTE — Assessment & Plan Note (Signed)
S: Well controlled. Released by GI A/P: I will assume rx for prilosec as per GI request

## 2016-11-02 NOTE — Assessment & Plan Note (Signed)
HTN- at goal per JNC 8 on lisinopril 40mg . At home - 124/77. Discussed new guidelines and need for weight loss to prevent more medication- he is at goal at home fortunately

## 2016-11-02 NOTE — Progress Notes (Addendum)
Phone: [phone]  Subjective:  Patient presents today for their annual physical. Chief complaint-noted.   See problem oriented charting- ROS- full  review of systems was completed and negative except for: intermittent abdominal cramping and diarrhea reasonably controlled on meds. Admits to depressed mood. No SI.   The following were reviewed and entered/updated in epic: Past Medical History:  Diagnosis Date  . Acne   . Allergy   . Anxiety   . Depression   . External hemorrhoids    2004  . GERD (gastroesophageal reflux disease)   . HLD (hyperlipidemia)   . Irritable bowel syndrome   . Strabismus    Patient Active Problem List   Diagnosis Date Noted  . Hypertension 02/19/2014    Priority: Medium  . IBS (irritable bowel syndrome) diarrhea predominant 08/14/2012    Priority: Medium  . Allergic rhinitis 02/21/2012    Priority: Medium  . Hyperlipidemia 09/05/2007    Priority: Medium  . Depression 05/25/2007    Priority: Medium  . GERD (gastroesophageal reflux disease) 11/29/2012    Priority: Low   Past Surgical History:  Procedure Laterality Date  . COLONOSCOPY  10/16/12  . FLEXIBLE SIGMOIDOSCOPY     hemorrhoids-rule out internal bleeding  . STRABISMUS SURGERY  2001, 2010   X 2  . WISDOM TOOTH EXTRACTION      Family History  Problem Relation Age of Onset  . Breast cancer Mother   . Hypertension Mother   . Hyperlipidemia Mother   . Prostate cancer Father     late 40s  . Ulcerative colitis Father   . Hypertension Father   . Hyperlipidemia Father   . Colon cancer Neg Hx   . Rectal cancer Neg Hx   . Stomach cancer Neg Hx   . Esophageal cancer Neg Hx     Medications- reviewed and updated Current Outpatient Prescriptions  Medication Sig Dispense Refill  . Alpha-D-Galactosidase (BEANO PO) Take by mouth as needed.    Marland Kitchen azelastine (ASTELIN) 137 MCG/SPRAY nasal spray 1 spray by Nasal route 2 (two) times daily. Use in each nostril as directed     . budesonide  (RHINOCORT AQUA) 32 MCG/ACT nasal spray 1 spray by Nasal route daily.      . Calcium Carbonate Antacid (TUMS PO) Take by mouth as needed.    . colestipol (COLESTID) 1 g tablet Take 2 tablets (2 g total) by mouth 2 (two) times daily. 360 tablet 3  . desloratadine (CLARINEX) 5 MG tablet Take 5 mg by mouth daily.      Marland Kitchen Flavoring Agent (PEPPERMINT FLAVOR) OIL Take 1 capsule by mouth daily.    . hyoscyamine (LEVSIN SL) 0.125 MG SL tablet Place 1 tablet (0.125 mg total) under the tongue every 4 (four) hours as needed. 60 tablet 5  . lisinopril (PRINIVIL,ZESTRIL) 40 MG tablet TAKE ONE TABLET BY MOUTH ONCE DAILY 30 tablet 5  . Loperamide HCl (IMODIUM A-D PO) Take by mouth as needed.    . NON FORMULARY Allergy shots weekly    . Omega-3 Fatty Acids (FISH OIL) 1200 MG CAPS Take 1 capsule by mouth daily.    Marland Kitchen omeprazole (PRILOSEC) 20 MG capsule TAKE 1 CAPSULE BY MOUTH ONCE DAILY 30 TO 60 MINUTES BEFORE BREAKFAST 90 capsule 3  . pravastatin (PRAVACHOL) 40 MG tablet TAKE ONE TABLET BY MOUTH ONCE DAILY 90 tablet 2  . escitalopram (LEXAPRO) 20 MG tablet Take 1 tablet (20 mg total) by mouth daily. 30 tablet 5   No current facility-administered medications  for this visit.     Allergies-reviewed and updated No Known Allergies  Social History   Social History  . Marital status: Single    Spouse name: N/A  . Number of children: 0  . Years of education: N/A   Occupational History  . day trader    Social History Main Topics  . Smoking status: Never Smoker  . Smokeless tobacco: Never Used  . Alcohol use 0.0 oz/week     Comment: rare 1-2x a month  . Drug use: No  . Sexual activity: Not Asked   Other Topics Concern  . None   Social History Narrative   Single. Live with parents.       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 Oropharynx normal Neck: no thyromegaly, prominent sternocleidomastoid CV: RRR no murmurs rubs or gallops Lungs: CTAB no crackles, wheeze, rhonchi Abdomen: soft/nontender/nondistended/normal bowel sounds. No rebound or guarding. Overweight.  Ext: no edema Skin: warm, dry Neuro: grossly normal, moves all extremities, PERRLA Rectal declined  Assessment/Plan:  40 y.o. male presenting for annual physical.  Health Maintenance counseling: 1. Anticipatory guidance: Patient counseled regarding regular dental exams, eye exams, wearing seatbelts.  2. Risk factor reduction:  Advised patient of need for regular exercise and diet rich and fruits and vegetables to reduce risk of heart attack and stroke. Averaging a mile a week walking- encouraged 150 minutes a week. Discussed target 170 weight.  Wt Readings from Last 3 Encounters:  11/02/16 191 lb (86.6 kg)  10/04/16 193 lb 6 oz (87.7 kg)  04/27/16 191 lb (86.6 kg)   3. Immunizations/screenings/ancillary studies Immunization History  Administered Date(s) Administered  . Influenza Split 09/11/2012  . Influenza Whole 08/22/2005, 09/04/2007, 08/03/2010  . Influenza,inj,Quad PF,36+ Mos 08/09/2013, 09/08/2016  . Influenza-Unspecified 09/05/2014, 08/21/2015  . Td 01/29/2010  . Tdap 03/12/2008  4. Prostate cancer screening- diagnosed 44 in father- will start at age 55 screening 34. Colon cancer screening - no family history, start at age 22 6. Skin cancer screening- Dr. Elvera Lennox- no history skin cancer. Using sunscreen.  7. Advised monthly self exams of testicles for testicular cancer screening  Status of chronic or acute concerns   Depression S: poor control on prozac 20mg  with phq9 of 14- no SI. Citalopram 20mg  in past couldn't up due to PPI--> change to prozac  20mg --poor control--> 40mg  (vivid dreams and increased appetite on 40mg ) A/P: will trial lexapro 20mg . If does not do well with this- will reach out to me and would likely change to wellbutrin 150mg  XL (could eventually add back in citalopram 20mg  if needed or titrate dose of wellbutrin). Discussed suicidal risk of medications.   IBS (irritable bowel syndrome) diarrhea predominant S: December 2013 colonoscopy with random biopsies negative. He has responded to colestipol, hyosyamine for cramping. Imodium for diarrhea. Diarrhea predominant. Was seeing GI yearly and he has been released at this time by Dr. Carlean Purl if I will assume rx  A/P: since he is doing well. Agreed to transition care to primary care. Rare hyoscyamine use. Colestipol  2 tablets twice a day- may contribute to high triglyceride--> I filled both meds for first time today (prior by Dr. Carlean Purl)  Hyperlipidemia well controlled on pravastatin 40mg  with LDL <100. Discussed weight loss for high triglycerides. Also colestipol for ibs may contribute  Hypertension HTN- at goal per JNC 8 on lisinopril 40mg . At home - 124/77. Discussed new guidelines and need for weight loss to prevent more medication- he is at goal at home fortunately  GERD (gastroesophageal reflux disease) S: Well controlled. Released by GI A/P: I will assume rx for prilosec as per GI request  8 week follow up  Meds ordered this encounter  Medications  . omeprazole (PRILOSEC) 20 MG capsule    Sig: TAKE 1 CAPSULE BY MOUTH ONCE DAILY 30 TO 60 MINUTES BEFORE BREAKFAST    Dispense:  90 capsule    Refill:  3  . colestipol (COLESTID) 1 g tablet    Sig: Take 2 tablets (2 g total) by mouth 2 (two) times daily.    Dispense:  360 tablet    Refill:  3  . hyoscyamine (LEVSIN SL) 0.125 MG SL tablet    Sig: Place 1 tablet (0.125 mg total) under the tongue every 4 (four) hours as needed.    Dispense:  60 tablet    Refill:  5  . escitalopram (LEXAPRO) 20 MG tablet    Sig:  Take 1 tablet (20 mg total) by mouth daily.    Dispense:  30 tablet    Refill:  5  CPE but with acute problem- established worsening depression as well as assuming rx for IBS and GERD meds  Return precautions advised.   [name], MD

## 2016-11-02 NOTE — Patient Instructions (Signed)
Last dose of fluoxetine today. Start lexapro/escitalopram tomorrow. Follow up 6-8 weeks with me. If you do not tolerate this medicine- give me an update within about a week or two and we will consider trial of wellbutrin.   I refilled GI meds and can continue to do so in future

## 2016-11-02 NOTE — Assessment & Plan Note (Signed)
well controlled on pravastatin 40mg  with LDL <100. Discussed weight loss for high triglycerides. Also colestipol for ibs may contribute

## 2016-11-02 NOTE — Assessment & Plan Note (Addendum)
S: poor control on prozac 20mg  with phq9 of 14- no SI. Citalopram 20mg  in past couldn't up due to PPI--> change to prozac 20mg --poor control--> 40mg  (vivid dreams and increased appetite on 40mg ) A/P: will trial lexapro 20mg . If does not do well with this- will reach out to me and would likely change to wellbutrin 150mg  XL (could eventually add back in citalopram 20mg  if needed or titrate dose of wellbutrin). Discussed suicidal risk of medications.

## 2016-11-02 NOTE — Progress Notes (Signed)
Pre visit review using our clinic review tool, if applicable. No additional management support is needed unless otherwise documented below in the visit note. 

## 2016-11-08 DIAGNOSIS — J301 Allergic rhinitis due to pollen: Secondary | ICD-10-CM | POA: Diagnosis not present

## 2016-11-08 DIAGNOSIS — J3089 Other allergic rhinitis: Secondary | ICD-10-CM | POA: Diagnosis not present

## 2016-11-08 DIAGNOSIS — J3081 Allergic rhinitis due to animal (cat) (dog) hair and dander: Secondary | ICD-10-CM | POA: Diagnosis not present

## 2016-11-17 DIAGNOSIS — J3081 Allergic rhinitis due to animal (cat) (dog) hair and dander: Secondary | ICD-10-CM | POA: Diagnosis not present

## 2016-11-17 DIAGNOSIS — J301 Allergic rhinitis due to pollen: Secondary | ICD-10-CM | POA: Diagnosis not present

## 2016-11-17 DIAGNOSIS — J3089 Other allergic rhinitis: Secondary | ICD-10-CM | POA: Diagnosis not present

## 2016-11-24 DIAGNOSIS — J3081 Allergic rhinitis due to animal (cat) (dog) hair and dander: Secondary | ICD-10-CM | POA: Diagnosis not present

## 2016-11-24 DIAGNOSIS — J301 Allergic rhinitis due to pollen: Secondary | ICD-10-CM | POA: Diagnosis not present

## 2016-11-24 DIAGNOSIS — J3089 Other allergic rhinitis: Secondary | ICD-10-CM | POA: Diagnosis not present

## 2016-11-29 DIAGNOSIS — J301 Allergic rhinitis due to pollen: Secondary | ICD-10-CM | POA: Diagnosis not present

## 2016-11-29 DIAGNOSIS — J3089 Other allergic rhinitis: Secondary | ICD-10-CM | POA: Diagnosis not present

## 2016-11-29 DIAGNOSIS — J3081 Allergic rhinitis due to animal (cat) (dog) hair and dander: Secondary | ICD-10-CM | POA: Diagnosis not present

## 2016-11-30 DIAGNOSIS — J301 Allergic rhinitis due to pollen: Secondary | ICD-10-CM | POA: Diagnosis not present

## 2016-11-30 DIAGNOSIS — J3081 Allergic rhinitis due to animal (cat) (dog) hair and dander: Secondary | ICD-10-CM | POA: Diagnosis not present

## 2016-11-30 DIAGNOSIS — J3089 Other allergic rhinitis: Secondary | ICD-10-CM | POA: Diagnosis not present

## 2016-12-06 DIAGNOSIS — J3081 Allergic rhinitis due to animal (cat) (dog) hair and dander: Secondary | ICD-10-CM | POA: Diagnosis not present

## 2016-12-06 DIAGNOSIS — J301 Allergic rhinitis due to pollen: Secondary | ICD-10-CM | POA: Diagnosis not present

## 2016-12-06 DIAGNOSIS — J3089 Other allergic rhinitis: Secondary | ICD-10-CM | POA: Diagnosis not present

## 2016-12-13 DIAGNOSIS — J301 Allergic rhinitis due to pollen: Secondary | ICD-10-CM | POA: Diagnosis not present

## 2016-12-13 DIAGNOSIS — J3081 Allergic rhinitis due to animal (cat) (dog) hair and dander: Secondary | ICD-10-CM | POA: Diagnosis not present

## 2016-12-13 DIAGNOSIS — J3089 Other allergic rhinitis: Secondary | ICD-10-CM | POA: Diagnosis not present

## 2016-12-20 DIAGNOSIS — J3089 Other allergic rhinitis: Secondary | ICD-10-CM | POA: Diagnosis not present

## 2016-12-20 DIAGNOSIS — J3081 Allergic rhinitis due to animal (cat) (dog) hair and dander: Secondary | ICD-10-CM | POA: Diagnosis not present

## 2016-12-20 DIAGNOSIS — J301 Allergic rhinitis due to pollen: Secondary | ICD-10-CM | POA: Diagnosis not present

## 2016-12-22 DIAGNOSIS — J3089 Other allergic rhinitis: Secondary | ICD-10-CM | POA: Diagnosis not present

## 2016-12-22 DIAGNOSIS — J3081 Allergic rhinitis due to animal (cat) (dog) hair and dander: Secondary | ICD-10-CM | POA: Diagnosis not present

## 2016-12-22 DIAGNOSIS — J301 Allergic rhinitis due to pollen: Secondary | ICD-10-CM | POA: Diagnosis not present

## 2016-12-28 ENCOUNTER — Ambulatory Visit (INDEPENDENT_AMBULATORY_CARE_PROVIDER_SITE_OTHER): Payer: BLUE CROSS/BLUE SHIELD | Admitting: Family Medicine

## 2016-12-28 ENCOUNTER — Encounter: Payer: Self-pay | Admitting: Family Medicine

## 2016-12-28 DIAGNOSIS — J3081 Allergic rhinitis due to animal (cat) (dog) hair and dander: Secondary | ICD-10-CM | POA: Diagnosis not present

## 2016-12-28 DIAGNOSIS — F321 Major depressive disorder, single episode, moderate: Secondary | ICD-10-CM | POA: Diagnosis not present

## 2016-12-28 DIAGNOSIS — J301 Allergic rhinitis due to pollen: Secondary | ICD-10-CM | POA: Diagnosis not present

## 2016-12-28 DIAGNOSIS — J3089 Other allergic rhinitis: Secondary | ICD-10-CM | POA: Diagnosis not present

## 2016-12-28 MED ORDER — BUPROPION HCL ER (XL) 150 MG PO TB24: 150.0000 mg | ORAL_TABLET | Freq: Every day | ORAL | 5 refills | Status: DC

## 2016-12-28 NOTE — Assessment & Plan Note (Signed)
S: last visit complained of poorly controlled depression with phq9 of 14. He had poor control on 20mg  prozac so we went to 40mg  but it increased his appetite and vivid dreams. Has tolerated change to 20mg  lexapro but PHQ9 today remains at 13 with no SI. Feels like anxiety is less, but energy decreased on medicine.  A/P:  We opted to continue lexapro 20mg  and add wellbutrin 150mg  XL with 6-8 week repeat. Suicide precautions and side effects reviewed.   Weight up a few lbs- discussed reversing but may need depression better controlled to help with motivation.

## 2016-12-28 NOTE — Patient Instructions (Signed)
Continue lexapro 20mg , add wellbutrin 150mg  XL in the morning  Follow up 6-8 weeks. Contact us or 911 immediately if thoughts of harming yourself.

## 2016-12-28 NOTE — Progress Notes (Signed)
Subjective:  Garrett [name]. is a 41 y.o. year old very pleasant male patient who presents for/with See problem oriented charting ROS- no si. Anhedonia is primary issue but some ddepressed mood. Low energy.    Past Medical History-  Patient Active Problem List   Diagnosis Date Noted  . Hypertension 02/19/2014    Priority: Medium  . IBS (irritable bowel syndrome) diarrhea predominant 08/14/2012    Priority: Medium  . Allergic rhinitis 02/21/2012    Priority: Medium  . Hyperlipidemia 09/05/2007    Priority: Medium  . Depression 05/25/2007    Priority: Medium  . GERD (gastroesophageal reflux disease) 11/29/2012    Priority: Low    Medications- reviewed and updated Current Outpatient Prescriptions  Medication Sig Dispense Refill  . Alpha-D-Galactosidase (BEANO PO) Take by mouth as needed.    Marland Kitchen azelastine (ASTELIN) 137 MCG/SPRAY nasal spray 1 spray by Nasal route 2 (two) times daily. Use in each nostril as directed     . budesonide (RHINOCORT AQUA) 32 MCG/ACT nasal spray 1 spray by Nasal route daily.      . Calcium Carbonate Antacid (TUMS PO) Take by mouth as needed.    . colestipol (COLESTID) 1 g tablet Take 2 tablets (2 g total) by mouth 2 (two) times daily. 360 tablet 3  . desloratadine (CLARINEX) 5 MG tablet Take 5 mg by mouth daily.      Marland Kitchen escitalopram (LEXAPRO) 20 MG tablet Take 1 tablet (20 mg total) by mouth daily. 30 tablet 5  . Flavoring Agent (PEPPERMINT FLAVOR) OIL Take 1 capsule by mouth daily.    . hyoscyamine (LEVSIN SL) 0.125 MG SL tablet Place 1 tablet (0.125 mg total) under the tongue every 4 (four) hours as needed. 60 tablet 5  . lisinopril (PRINIVIL,ZESTRIL) 40 MG tablet TAKE ONE TABLET BY MOUTH ONCE DAILY 30 tablet 5  . Loperamide HCl (IMODIUM A-D PO) Take by mouth as needed.    . NON FORMULARY Allergy shots weekly    . Omega-3 Fatty Acids (FISH OIL) 1200 MG CAPS Take 1 capsule by mouth daily.    Marland Kitchen omeprazole (PRILOSEC) 20 MG capsule TAKE 1 CAPSULE BY MOUTH ONCE  DAILY 30 TO 60 MINUTES BEFORE BREAKFAST 90 capsule 3  . pravastatin (PRAVACHOL) 40 MG tablet TAKE ONE TABLET BY MOUTH ONCE DAILY 90 tablet 2  . buPROPion (WELLBUTRIN XL) 150 MG 24 hr tablet Take 1 tablet (150 mg total) by mouth daily. 30 tablet 5   No current facility-administered medications for this visit.     Objective: BP 120/72   Pulse (!) 107   Temp 99 F (37.2 C) (Oral)   Ht 5' 7.5" (1.715 m)   Wt 194 lb (88 kg)   SpO2 95%   BMI 29.94 kg/m  Gen: NAD, resting comfortably CV: RRR no murmurs rubs or gallops Lungs: CTAB no crackles, wheeze, rhonchi Abdomen: overweight.  Ext: no edema  Assessment/Plan:  Depression S: last visit complained of poorly controlled depression with phq9 of 14. He had poor control on 20mg  prozac so we went to 40mg  but it increased his appetite and vivid dreams. Has tolerated change to 20mg  lexapro but PHQ9 today remains at 13 with no SI. Feels like anxiety is less, but energy decreased on medicine.  A/P:  We opted to continue lexapro 20mg  and add wellbutrin 150mg  XL with 6-8 week repeat. Suicide precautions and side effects reviewed.   Weight up a few lbs- discussed reversing but may need depression better controlled to help  with motivation.   Meds ordered this encounter  Medications  . buPROPion (WELLBUTRIN XL) 150 MG 24 hr tablet    Sig: Take 1 tablet (150 mg total) by mouth daily.    Dispense:  30 tablet    Refill:  5    Return precautions advised.  [name], MD

## 2016-12-28 NOTE — Progress Notes (Signed)
Pre visit review using our clinic review tool, if applicable. No additional management support is needed unless otherwise documented below in the visit note. 

## 2016-12-31 DIAGNOSIS — J3089 Other allergic rhinitis: Secondary | ICD-10-CM | POA: Diagnosis not present

## 2016-12-31 DIAGNOSIS — J301 Allergic rhinitis due to pollen: Secondary | ICD-10-CM | POA: Diagnosis not present

## 2016-12-31 DIAGNOSIS — J3081 Allergic rhinitis due to animal (cat) (dog) hair and dander: Secondary | ICD-10-CM | POA: Diagnosis not present

## 2017-01-04 DIAGNOSIS — J3089 Other allergic rhinitis: Secondary | ICD-10-CM | POA: Diagnosis not present

## 2017-01-04 DIAGNOSIS — J301 Allergic rhinitis due to pollen: Secondary | ICD-10-CM | POA: Diagnosis not present

## 2017-01-04 DIAGNOSIS — J3081 Allergic rhinitis due to animal (cat) (dog) hair and dander: Secondary | ICD-10-CM | POA: Diagnosis not present

## 2017-01-11 DIAGNOSIS — J3089 Other allergic rhinitis: Secondary | ICD-10-CM | POA: Diagnosis not present

## 2017-01-11 DIAGNOSIS — J3081 Allergic rhinitis due to animal (cat) (dog) hair and dander: Secondary | ICD-10-CM | POA: Diagnosis not present

## 2017-01-11 DIAGNOSIS — J301 Allergic rhinitis due to pollen: Secondary | ICD-10-CM | POA: Diagnosis not present

## 2017-01-18 DIAGNOSIS — J301 Allergic rhinitis due to pollen: Secondary | ICD-10-CM | POA: Diagnosis not present

## 2017-01-18 DIAGNOSIS — J3089 Other allergic rhinitis: Secondary | ICD-10-CM | POA: Diagnosis not present

## 2017-01-18 DIAGNOSIS — J3081 Allergic rhinitis due to animal (cat) (dog) hair and dander: Secondary | ICD-10-CM | POA: Diagnosis not present

## 2017-01-24 DIAGNOSIS — J3081 Allergic rhinitis due to animal (cat) (dog) hair and dander: Secondary | ICD-10-CM | POA: Diagnosis not present

## 2017-01-24 DIAGNOSIS — J301 Allergic rhinitis due to pollen: Secondary | ICD-10-CM | POA: Diagnosis not present

## 2017-01-24 DIAGNOSIS — J3089 Other allergic rhinitis: Secondary | ICD-10-CM | POA: Diagnosis not present

## 2017-02-02 DIAGNOSIS — J3081 Allergic rhinitis due to animal (cat) (dog) hair and dander: Secondary | ICD-10-CM | POA: Diagnosis not present

## 2017-02-02 DIAGNOSIS — J3089 Other allergic rhinitis: Secondary | ICD-10-CM | POA: Diagnosis not present

## 2017-02-02 DIAGNOSIS — J301 Allergic rhinitis due to pollen: Secondary | ICD-10-CM | POA: Diagnosis not present

## 2017-02-07 ENCOUNTER — Other Ambulatory Visit: Payer: Self-pay | Admitting: Family Medicine

## 2017-02-07 DIAGNOSIS — J3089 Other allergic rhinitis: Secondary | ICD-10-CM | POA: Diagnosis not present

## 2017-02-07 DIAGNOSIS — J301 Allergic rhinitis due to pollen: Secondary | ICD-10-CM | POA: Diagnosis not present

## 2017-02-07 DIAGNOSIS — J3081 Allergic rhinitis due to animal (cat) (dog) hair and dander: Secondary | ICD-10-CM | POA: Diagnosis not present

## 2017-02-14 DIAGNOSIS — J301 Allergic rhinitis due to pollen: Secondary | ICD-10-CM | POA: Diagnosis not present

## 2017-02-14 DIAGNOSIS — J3089 Other allergic rhinitis: Secondary | ICD-10-CM | POA: Diagnosis not present

## 2017-02-14 DIAGNOSIS — J3081 Allergic rhinitis due to animal (cat) (dog) hair and dander: Secondary | ICD-10-CM | POA: Diagnosis not present

## 2017-02-21 DIAGNOSIS — J3089 Other allergic rhinitis: Secondary | ICD-10-CM | POA: Diagnosis not present

## 2017-02-21 DIAGNOSIS — J301 Allergic rhinitis due to pollen: Secondary | ICD-10-CM | POA: Diagnosis not present

## 2017-02-21 DIAGNOSIS — J3081 Allergic rhinitis due to animal (cat) (dog) hair and dander: Secondary | ICD-10-CM | POA: Diagnosis not present

## 2017-02-22 ENCOUNTER — Encounter: Payer: Self-pay | Admitting: Family Medicine

## 2017-02-22 ENCOUNTER — Ambulatory Visit (INDEPENDENT_AMBULATORY_CARE_PROVIDER_SITE_OTHER): Payer: BLUE CROSS/BLUE SHIELD | Admitting: Family Medicine

## 2017-02-22 VITALS — BP 126/72 | HR 87 | Temp 98.6°F | Ht 67.5 in | Wt 193.2 lb

## 2017-02-22 DIAGNOSIS — G4700 Insomnia, unspecified: Secondary | ICD-10-CM | POA: Diagnosis not present

## 2017-02-22 DIAGNOSIS — F325 Major depressive disorder, single episode, in full remission: Secondary | ICD-10-CM

## 2017-02-22 NOTE — Patient Instructions (Addendum)
Continue current medication both lexapro and wellbutrin  Lets reassess in 3-6 month time frame.   For 6 weeks- trial 1-5mg  melatonin nightly. Have consistent bedtime. Could also use benadryl 25mg  perhaps twice a week.   If no better, could consider medicine like ambien. Would not use on nights if had alcohol

## 2017-02-22 NOTE — Progress Notes (Signed)
Subjective:  Victorious Kundinger. is a 41 y.o. year old very pleasant male patient who presents for/with See problem oriented charting ROS- No chest pain or shortness of breath. No headache or blurry vision. No SI. No anhedonia or depressed mood.    Past Medical History-  Patient Active Problem List   Diagnosis Date Noted  . Hypertension 02/19/2014    Priority: Medium  . IBS (irritable bowel syndrome) diarrhea predominant 08/14/2012    Priority: Medium  . Allergic rhinitis 02/21/2012    Priority: Medium  . Hyperlipidemia 09/05/2007    Priority: Medium  . Depression 05/25/2007    Priority: Medium  . GERD (gastroesophageal reflux disease) 11/29/2012    Priority: Low    Medications- reviewed and updated Current Outpatient Prescriptions  Medication Sig Dispense Refill  . Alpha-D-Galactosidase (BEANO PO) Take by mouth as needed.    Marland Kitchen azelastine (ASTELIN) 137 MCG/SPRAY nasal spray 1 spray by Nasal route 2 (two) times daily. Use in each nostril as directed     . budesonide (RHINOCORT AQUA) 32 MCG/ACT nasal spray 1 spray by Nasal route daily.      Marland Kitchen buPROPion (WELLBUTRIN XL) 150 MG 24 hr tablet Take 1 tablet (150 mg total) by mouth daily. 30 tablet 5  . Calcium Carbonate Antacid (TUMS PO) Take by mouth as needed.    . colestipol (COLESTID) 1 g tablet Take 2 tablets (2 g total) by mouth 2 (two) times daily. 360 tablet 3  . desloratadine (CLARINEX) 5 MG tablet Take 5 mg by mouth daily.      Marland Kitchen escitalopram (LEXAPRO) 20 MG tablet Take 1 tablet (20 mg total) by mouth daily. 30 tablet 5  . Flavoring Agent (PEPPERMINT FLAVOR) OIL Take 1 capsule by mouth daily.    . hyoscyamine (LEVSIN SL) 0.125 MG SL tablet Place 1 tablet (0.125 mg total) under the tongue every 4 (four) hours as needed. 60 tablet 5  . lisinopril (PRINIVIL,ZESTRIL) 40 MG tablet TAKE ONE TABLET BY MOUTH ONCE DAILY 30 tablet 5  . Loperamide HCl (IMODIUM A-D PO) Take by mouth as needed.    . NON FORMULARY Allergy shots weekly    .  Omega-3 Fatty Acids (FISH OIL) 1200 MG CAPS Take 1 capsule by mouth daily.    Marland Kitchen omeprazole (PRILOSEC) 20 MG capsule TAKE 1 CAPSULE BY MOUTH ONCE DAILY 30 TO 60 MINUTES BEFORE BREAKFAST 90 capsule 3  . pravastatin (PRAVACHOL) 40 MG tablet TAKE ONE TABLET BY MOUTH ONCE DAILY 90 tablet 2   No current facility-administered medications for this visit.     Objective: BP 126/72 (BP Location: Left Arm, Patient Position: Sitting, Cuff Size: Large)   Pulse 87   Temp 98.6 F (37 C) (Oral)   Ht 5' 7.5" (1.715 m)   Wt 193 lb 3.2 oz (87.6 kg)   SpO2 96%   BMI 29.81 kg/m  Gen: NAD, resting comfortably CV: RRR  Lungs: nonlabored Abdomen: non distended but overweight.  Ext: no edema Skin: warm, dry, no rash  Assessment/Plan:  Depression Insomnia S: We had increased to prozac 40mg  originally but had increased appetite and vivid dreams--> we changed to lexapro 20mg  then later added on wellbutrin 150mg . last visit phq9 was stable at 14 but we wanted further improvement.   Depression symptoms much improved. Main side effect is difficulty falling and staying asleep with addition of wellbutrin. Slightly lower appetite on wellbutrin.   PHQ9 down to 6 but 0 for questions 1,2, 9. Sleep main issue for 3  and 4 accounting for 5 points.  A/P:Continue current medication both lexapro and wellbutrin  Lets reassess in 3-6 month time frame.   For 6 weeks- trial 1-5mg  melatonin nightly. Have consistent bedtime. Could also use benadryl 25mg  perhaps twice a week.   If no better, could consider medicine like ambien. Would not use on nights if had alcohol  Also weight down 1 lb- going in the right direction  Return precautions advised.  [name], MD

## 2017-02-22 NOTE — Progress Notes (Signed)
Pre visit review using our clinic review tool, if applicable. No additional management support is needed unless otherwise documented below in the visit note. 

## 2017-02-22 NOTE — Assessment & Plan Note (Signed)
Insomnia S: We had increased to prozac 40mg  originally but had increased appetite and vivid dreams--> we changed to lexapro 20mg  then later added on wellbutrin 150mg . last visit phq9 was stable at 14 but we wanted further improvement.   Depression symptoms much improved. Main side effect is difficulty falling and staying asleep with addition of wellbutrin. Slightly lower appetite on wellbutrin.   PHQ9 down to 6 but 0 for questions 1,2, 9. Sleep main issue for 3 and 4 accounting for 5 points.  A/P:Continue current medication both lexapro and wellbutrin  Lets reassess in 3-6 month time frame.   For 6 weeks- trial 1-5mg  melatonin nightly. Have consistent bedtime. Could also use benadryl 25mg  perhaps twice a week.   If no better, could consider medicine like ambien. Would not use on nights if had alcohol  Also weight down 1 lb- going in the right direction

## 2017-02-27 ENCOUNTER — Encounter: Payer: Self-pay | Admitting: Family Medicine

## 2017-03-03 DIAGNOSIS — J3081 Allergic rhinitis due to animal (cat) (dog) hair and dander: Secondary | ICD-10-CM | POA: Diagnosis not present

## 2017-03-03 DIAGNOSIS — J3089 Other allergic rhinitis: Secondary | ICD-10-CM | POA: Diagnosis not present

## 2017-03-03 DIAGNOSIS — J301 Allergic rhinitis due to pollen: Secondary | ICD-10-CM | POA: Diagnosis not present

## 2017-03-07 DIAGNOSIS — J3089 Other allergic rhinitis: Secondary | ICD-10-CM | POA: Diagnosis not present

## 2017-03-07 DIAGNOSIS — J301 Allergic rhinitis due to pollen: Secondary | ICD-10-CM | POA: Diagnosis not present

## 2017-03-07 DIAGNOSIS — J3081 Allergic rhinitis due to animal (cat) (dog) hair and dander: Secondary | ICD-10-CM | POA: Diagnosis not present

## 2017-03-09 DIAGNOSIS — J301 Allergic rhinitis due to pollen: Secondary | ICD-10-CM | POA: Diagnosis not present

## 2017-03-09 DIAGNOSIS — J3089 Other allergic rhinitis: Secondary | ICD-10-CM | POA: Diagnosis not present

## 2017-03-09 DIAGNOSIS — J3081 Allergic rhinitis due to animal (cat) (dog) hair and dander: Secondary | ICD-10-CM | POA: Diagnosis not present

## 2017-03-14 DIAGNOSIS — J301 Allergic rhinitis due to pollen: Secondary | ICD-10-CM | POA: Diagnosis not present

## 2017-03-14 DIAGNOSIS — J3081 Allergic rhinitis due to animal (cat) (dog) hair and dander: Secondary | ICD-10-CM | POA: Diagnosis not present

## 2017-03-14 DIAGNOSIS — J3089 Other allergic rhinitis: Secondary | ICD-10-CM | POA: Diagnosis not present

## 2017-03-16 DIAGNOSIS — J3081 Allergic rhinitis due to animal (cat) (dog) hair and dander: Secondary | ICD-10-CM | POA: Diagnosis not present

## 2017-03-16 DIAGNOSIS — J3089 Other allergic rhinitis: Secondary | ICD-10-CM | POA: Diagnosis not present

## 2017-03-16 DIAGNOSIS — J301 Allergic rhinitis due to pollen: Secondary | ICD-10-CM | POA: Diagnosis not present

## 2017-03-21 DIAGNOSIS — J3081 Allergic rhinitis due to animal (cat) (dog) hair and dander: Secondary | ICD-10-CM | POA: Diagnosis not present

## 2017-03-21 DIAGNOSIS — J3089 Other allergic rhinitis: Secondary | ICD-10-CM | POA: Diagnosis not present

## 2017-03-21 DIAGNOSIS — J301 Allergic rhinitis due to pollen: Secondary | ICD-10-CM | POA: Diagnosis not present

## 2017-03-28 DIAGNOSIS — J301 Allergic rhinitis due to pollen: Secondary | ICD-10-CM | POA: Diagnosis not present

## 2017-03-28 DIAGNOSIS — J3089 Other allergic rhinitis: Secondary | ICD-10-CM | POA: Diagnosis not present

## 2017-03-28 DIAGNOSIS — J3081 Allergic rhinitis due to animal (cat) (dog) hair and dander: Secondary | ICD-10-CM | POA: Diagnosis not present

## 2017-04-01 DIAGNOSIS — J301 Allergic rhinitis due to pollen: Secondary | ICD-10-CM | POA: Diagnosis not present

## 2017-04-01 DIAGNOSIS — J3089 Other allergic rhinitis: Secondary | ICD-10-CM | POA: Diagnosis not present

## 2017-04-01 DIAGNOSIS — J3081 Allergic rhinitis due to animal (cat) (dog) hair and dander: Secondary | ICD-10-CM | POA: Diagnosis not present

## 2017-04-04 DIAGNOSIS — J301 Allergic rhinitis due to pollen: Secondary | ICD-10-CM | POA: Diagnosis not present

## 2017-04-04 DIAGNOSIS — J3089 Other allergic rhinitis: Secondary | ICD-10-CM | POA: Diagnosis not present

## 2017-04-04 DIAGNOSIS — J3081 Allergic rhinitis due to animal (cat) (dog) hair and dander: Secondary | ICD-10-CM | POA: Diagnosis not present

## 2017-04-12 DIAGNOSIS — J3089 Other allergic rhinitis: Secondary | ICD-10-CM | POA: Diagnosis not present

## 2017-04-12 DIAGNOSIS — J3081 Allergic rhinitis due to animal (cat) (dog) hair and dander: Secondary | ICD-10-CM | POA: Diagnosis not present

## 2017-04-12 DIAGNOSIS — J301 Allergic rhinitis due to pollen: Secondary | ICD-10-CM | POA: Diagnosis not present

## 2017-04-19 DIAGNOSIS — J301 Allergic rhinitis due to pollen: Secondary | ICD-10-CM | POA: Diagnosis not present

## 2017-04-19 DIAGNOSIS — J3081 Allergic rhinitis due to animal (cat) (dog) hair and dander: Secondary | ICD-10-CM | POA: Diagnosis not present

## 2017-04-19 DIAGNOSIS — J3089 Other allergic rhinitis: Secondary | ICD-10-CM | POA: Diagnosis not present

## 2017-04-25 ENCOUNTER — Other Ambulatory Visit: Payer: Self-pay | Admitting: Family Medicine

## 2017-04-25 DIAGNOSIS — F321 Major depressive disorder, single episode, moderate: Secondary | ICD-10-CM

## 2017-04-26 DIAGNOSIS — J3081 Allergic rhinitis due to animal (cat) (dog) hair and dander: Secondary | ICD-10-CM | POA: Diagnosis not present

## 2017-04-26 DIAGNOSIS — J301 Allergic rhinitis due to pollen: Secondary | ICD-10-CM | POA: Diagnosis not present

## 2017-04-26 DIAGNOSIS — J3089 Other allergic rhinitis: Secondary | ICD-10-CM | POA: Diagnosis not present

## 2017-05-02 DIAGNOSIS — J3089 Other allergic rhinitis: Secondary | ICD-10-CM | POA: Diagnosis not present

## 2017-05-02 DIAGNOSIS — J301 Allergic rhinitis due to pollen: Secondary | ICD-10-CM | POA: Diagnosis not present

## 2017-05-02 DIAGNOSIS — J3081 Allergic rhinitis due to animal (cat) (dog) hair and dander: Secondary | ICD-10-CM | POA: Diagnosis not present

## 2017-05-06 DIAGNOSIS — J301 Allergic rhinitis due to pollen: Secondary | ICD-10-CM | POA: Diagnosis not present

## 2017-05-06 DIAGNOSIS — J3081 Allergic rhinitis due to animal (cat) (dog) hair and dander: Secondary | ICD-10-CM | POA: Diagnosis not present

## 2017-05-09 DIAGNOSIS — J3089 Other allergic rhinitis: Secondary | ICD-10-CM | POA: Diagnosis not present

## 2017-05-09 DIAGNOSIS — J301 Allergic rhinitis due to pollen: Secondary | ICD-10-CM | POA: Diagnosis not present

## 2017-05-09 DIAGNOSIS — J3081 Allergic rhinitis due to animal (cat) (dog) hair and dander: Secondary | ICD-10-CM | POA: Diagnosis not present

## 2017-05-16 DIAGNOSIS — J3089 Other allergic rhinitis: Secondary | ICD-10-CM | POA: Diagnosis not present

## 2017-05-16 DIAGNOSIS — J301 Allergic rhinitis due to pollen: Secondary | ICD-10-CM | POA: Diagnosis not present

## 2017-05-16 DIAGNOSIS — J3081 Allergic rhinitis due to animal (cat) (dog) hair and dander: Secondary | ICD-10-CM | POA: Diagnosis not present

## 2017-05-18 DIAGNOSIS — J3081 Allergic rhinitis due to animal (cat) (dog) hair and dander: Secondary | ICD-10-CM | POA: Diagnosis not present

## 2017-05-18 DIAGNOSIS — J301 Allergic rhinitis due to pollen: Secondary | ICD-10-CM | POA: Diagnosis not present

## 2017-05-18 DIAGNOSIS — J3089 Other allergic rhinitis: Secondary | ICD-10-CM | POA: Diagnosis not present

## 2017-05-23 DIAGNOSIS — J3081 Allergic rhinitis due to animal (cat) (dog) hair and dander: Secondary | ICD-10-CM | POA: Diagnosis not present

## 2017-05-23 DIAGNOSIS — J3089 Other allergic rhinitis: Secondary | ICD-10-CM | POA: Diagnosis not present

## 2017-05-23 DIAGNOSIS — J301 Allergic rhinitis due to pollen: Secondary | ICD-10-CM | POA: Diagnosis not present

## 2017-05-27 DIAGNOSIS — J3089 Other allergic rhinitis: Secondary | ICD-10-CM | POA: Diagnosis not present

## 2017-05-27 DIAGNOSIS — J301 Allergic rhinitis due to pollen: Secondary | ICD-10-CM | POA: Diagnosis not present

## 2017-05-27 DIAGNOSIS — J3081 Allergic rhinitis due to animal (cat) (dog) hair and dander: Secondary | ICD-10-CM | POA: Diagnosis not present

## 2017-06-03 DIAGNOSIS — J3081 Allergic rhinitis due to animal (cat) (dog) hair and dander: Secondary | ICD-10-CM | POA: Diagnosis not present

## 2017-06-03 DIAGNOSIS — J301 Allergic rhinitis due to pollen: Secondary | ICD-10-CM | POA: Diagnosis not present

## 2017-06-03 DIAGNOSIS — J3089 Other allergic rhinitis: Secondary | ICD-10-CM | POA: Diagnosis not present

## 2017-06-06 DIAGNOSIS — J3081 Allergic rhinitis due to animal (cat) (dog) hair and dander: Secondary | ICD-10-CM | POA: Diagnosis not present

## 2017-06-06 DIAGNOSIS — J3089 Other allergic rhinitis: Secondary | ICD-10-CM | POA: Diagnosis not present

## 2017-06-06 DIAGNOSIS — J301 Allergic rhinitis due to pollen: Secondary | ICD-10-CM | POA: Diagnosis not present

## 2017-06-10 DIAGNOSIS — J3081 Allergic rhinitis due to animal (cat) (dog) hair and dander: Secondary | ICD-10-CM | POA: Diagnosis not present

## 2017-06-10 DIAGNOSIS — J301 Allergic rhinitis due to pollen: Secondary | ICD-10-CM | POA: Diagnosis not present

## 2017-06-10 DIAGNOSIS — J3089 Other allergic rhinitis: Secondary | ICD-10-CM | POA: Diagnosis not present

## 2017-06-13 ENCOUNTER — Other Ambulatory Visit: Payer: Self-pay | Admitting: Family Medicine

## 2017-06-13 DIAGNOSIS — J3089 Other allergic rhinitis: Secondary | ICD-10-CM | POA: Diagnosis not present

## 2017-06-13 DIAGNOSIS — J301 Allergic rhinitis due to pollen: Secondary | ICD-10-CM | POA: Diagnosis not present

## 2017-06-13 DIAGNOSIS — J3081 Allergic rhinitis due to animal (cat) (dog) hair and dander: Secondary | ICD-10-CM | POA: Diagnosis not present

## 2017-06-20 ENCOUNTER — Other Ambulatory Visit: Payer: Self-pay | Admitting: Family Medicine

## 2017-06-23 DIAGNOSIS — J301 Allergic rhinitis due to pollen: Secondary | ICD-10-CM | POA: Diagnosis not present

## 2017-06-23 DIAGNOSIS — J3081 Allergic rhinitis due to animal (cat) (dog) hair and dander: Secondary | ICD-10-CM | POA: Diagnosis not present

## 2017-06-23 DIAGNOSIS — J3089 Other allergic rhinitis: Secondary | ICD-10-CM | POA: Diagnosis not present

## 2017-06-29 DIAGNOSIS — J301 Allergic rhinitis due to pollen: Secondary | ICD-10-CM | POA: Diagnosis not present

## 2017-06-29 DIAGNOSIS — J3089 Other allergic rhinitis: Secondary | ICD-10-CM | POA: Diagnosis not present

## 2017-06-29 DIAGNOSIS — J3081 Allergic rhinitis due to animal (cat) (dog) hair and dander: Secondary | ICD-10-CM | POA: Diagnosis not present

## 2017-07-05 DIAGNOSIS — J301 Allergic rhinitis due to pollen: Secondary | ICD-10-CM | POA: Diagnosis not present

## 2017-07-05 DIAGNOSIS — J3081 Allergic rhinitis due to animal (cat) (dog) hair and dander: Secondary | ICD-10-CM | POA: Diagnosis not present

## 2017-07-05 DIAGNOSIS — J3089 Other allergic rhinitis: Secondary | ICD-10-CM | POA: Diagnosis not present

## 2017-07-11 DIAGNOSIS — J3089 Other allergic rhinitis: Secondary | ICD-10-CM | POA: Diagnosis not present

## 2017-07-11 DIAGNOSIS — J3081 Allergic rhinitis due to animal (cat) (dog) hair and dander: Secondary | ICD-10-CM | POA: Diagnosis not present

## 2017-07-11 DIAGNOSIS — J301 Allergic rhinitis due to pollen: Secondary | ICD-10-CM | POA: Diagnosis not present

## 2017-07-19 ENCOUNTER — Ambulatory Visit (INDEPENDENT_AMBULATORY_CARE_PROVIDER_SITE_OTHER): Payer: BLUE CROSS/BLUE SHIELD

## 2017-07-19 DIAGNOSIS — Z23 Encounter for immunization: Secondary | ICD-10-CM | POA: Diagnosis not present

## 2017-07-22 DIAGNOSIS — J301 Allergic rhinitis due to pollen: Secondary | ICD-10-CM | POA: Diagnosis not present

## 2017-07-22 DIAGNOSIS — J3089 Other allergic rhinitis: Secondary | ICD-10-CM | POA: Diagnosis not present

## 2017-07-22 DIAGNOSIS — J3081 Allergic rhinitis due to animal (cat) (dog) hair and dander: Secondary | ICD-10-CM | POA: Diagnosis not present

## 2017-07-27 DIAGNOSIS — J3089 Other allergic rhinitis: Secondary | ICD-10-CM | POA: Diagnosis not present

## 2017-07-27 DIAGNOSIS — J301 Allergic rhinitis due to pollen: Secondary | ICD-10-CM | POA: Diagnosis not present

## 2017-07-27 DIAGNOSIS — J3081 Allergic rhinitis due to animal (cat) (dog) hair and dander: Secondary | ICD-10-CM | POA: Diagnosis not present

## 2017-08-01 DIAGNOSIS — J301 Allergic rhinitis due to pollen: Secondary | ICD-10-CM | POA: Diagnosis not present

## 2017-08-01 DIAGNOSIS — J3081 Allergic rhinitis due to animal (cat) (dog) hair and dander: Secondary | ICD-10-CM | POA: Diagnosis not present

## 2017-08-01 DIAGNOSIS — J3089 Other allergic rhinitis: Secondary | ICD-10-CM | POA: Diagnosis not present

## 2017-08-08 ENCOUNTER — Other Ambulatory Visit: Payer: Self-pay | Admitting: Family Medicine

## 2017-08-09 DIAGNOSIS — J301 Allergic rhinitis due to pollen: Secondary | ICD-10-CM | POA: Diagnosis not present

## 2017-08-09 DIAGNOSIS — J3089 Other allergic rhinitis: Secondary | ICD-10-CM | POA: Diagnosis not present

## 2017-08-09 DIAGNOSIS — J3081 Allergic rhinitis due to animal (cat) (dog) hair and dander: Secondary | ICD-10-CM | POA: Diagnosis not present

## 2017-08-16 DIAGNOSIS — J301 Allergic rhinitis due to pollen: Secondary | ICD-10-CM | POA: Diagnosis not present

## 2017-08-16 DIAGNOSIS — J3089 Other allergic rhinitis: Secondary | ICD-10-CM | POA: Diagnosis not present

## 2017-08-16 DIAGNOSIS — J3081 Allergic rhinitis due to animal (cat) (dog) hair and dander: Secondary | ICD-10-CM | POA: Diagnosis not present

## 2017-08-22 DIAGNOSIS — J3081 Allergic rhinitis due to animal (cat) (dog) hair and dander: Secondary | ICD-10-CM | POA: Diagnosis not present

## 2017-08-22 DIAGNOSIS — J3089 Other allergic rhinitis: Secondary | ICD-10-CM | POA: Diagnosis not present

## 2017-08-22 DIAGNOSIS — J301 Allergic rhinitis due to pollen: Secondary | ICD-10-CM | POA: Diagnosis not present

## 2017-08-23 ENCOUNTER — Ambulatory Visit (INDEPENDENT_AMBULATORY_CARE_PROVIDER_SITE_OTHER): Payer: BLUE CROSS/BLUE SHIELD | Admitting: Family Medicine

## 2017-08-23 ENCOUNTER — Encounter: Payer: Self-pay | Admitting: Family Medicine

## 2017-08-23 VITALS — BP 118/78 | HR 91 | Temp 98.6°F | Ht 67.5 in | Wt 192.4 lb

## 2017-08-23 DIAGNOSIS — I10 Essential (primary) hypertension: Secondary | ICD-10-CM

## 2017-08-23 DIAGNOSIS — F325 Major depressive disorder, single episode, in full remission: Secondary | ICD-10-CM

## 2017-08-23 DIAGNOSIS — E785 Hyperlipidemia, unspecified: Secondary | ICD-10-CM

## 2017-08-23 NOTE — Assessment & Plan Note (Signed)
S:  Patient compliant with lexapro 20mg  and wellbutrin 150mg . Last visit phq9 was at 6 but answered 0 for questions 1,2, 9. Sleep has been his main issue driving higher score.   We opted to add melatonin 1-5mg  nightly and encouraged consistent bedtime. Also discussed benadryl 25mg  perhaps twice a week. Discussed ambien as back up option  Today phq9 of 3 with score of 1 of 3,4,5. Melatonin caused skin irritation on face. Went to once a week benadryl and helps a lot.  A/P: continue current medications.   Usually winters are harder for him- discussed trying to maximize sunlight in his day.

## 2017-08-23 NOTE — Patient Instructions (Signed)
No changes today  Keep working on weight loss. Want you to set a goal of minimum 5-10 lbs by follow up with healthy eating/regular exercise.

## 2017-08-23 NOTE — Progress Notes (Signed)
Subjective:  Garrett [name]. is a 41 y.o. year old very pleasant male patient who presents for/with See problem oriented charting ROS- no Si. No anhedonia or depressed mood. Some poor sleep but much improved. No chest pain or shortness of breath. No headache or blurry vision.    Past Medical History-  Patient Active Problem List   Diagnosis Date Noted  . Hypertension 02/19/2014    Priority: Medium  . IBS (irritable bowel syndrome) diarrhea predominant 08/14/2012    Priority: Medium  . Allergic rhinitis 02/21/2012    Priority: Medium  . Hyperlipidemia 09/05/2007    Priority: Medium  . Depression 05/25/2007    Priority: Medium  . GERD (gastroesophageal reflux disease) 11/29/2012    Priority: Low    Medications- reviewed and updated Current Outpatient Prescriptions  Medication Sig Dispense Refill  . Alpha-D-Galactosidase (BEANO PO) Take by mouth as needed.    Marland Kitchen azelastine (ASTELIN) 137 MCG/SPRAY nasal spray 1 spray by Nasal route 2 (two) times daily. Use in each nostril as directed     . budesonide (RHINOCORT AQUA) 32 MCG/ACT nasal spray 1 spray by Nasal route daily.      Marland Kitchen buPROPion (WELLBUTRIN XL) 150 MG 24 hr tablet TAKE ONE TABLET BY MOUTH ONCE DAILY 90 tablet 2  . Calcium Carbonate Antacid (TUMS PO) Take by mouth as needed.    . colestipol (COLESTID) 1 g tablet Take 2 tablets (2 g total) by mouth 2 (two) times daily. 360 tablet 3  . desloratadine (CLARINEX) 5 MG tablet Take 5 mg by mouth daily.      Marland Kitchen escitalopram (LEXAPRO) 20 MG tablet TAKE ONE TABLET BY MOUTH  DAILY 30 tablet 5  . Flavoring Agent (PEPPERMINT FLAVOR) OIL Take 1 capsule by mouth daily.    . hyoscyamine (LEVSIN SL) 0.125 MG SL tablet Place 1 tablet (0.125 mg total) under the tongue every 4 (four) hours as needed. 60 tablet 5  . lisinopril (PRINIVIL,ZESTRIL) 40 MG tablet TAKE 1 TABLET BY MOUTH ONCE DAILY 30 tablet 5  . Loperamide HCl (IMODIUM A-D PO) Take by mouth as needed.    . NON FORMULARY Allergy shots  weekly    . Omega-3 Fatty Acids (FISH OIL) 1200 MG CAPS Take 1 capsule by mouth daily.    Marland Kitchen omeprazole (PRILOSEC) 20 MG capsule TAKE 1 CAPSULE BY MOUTH ONCE DAILY 30 TO 60 MINUTES BEFORE BREAKFAST 90 capsule 3  . pravastatin (PRAVACHOL) 40 MG tablet TAKE ONE TABLET BY MOUTH ONCE DAILY 90 tablet 2   No current facility-administered medications for this visit.     Objective: BP 118/78 (BP Location: Left Arm, Patient Position: Sitting, Cuff Size: Large)   Pulse 91   Temp 98.6 F (37 C) (Oral)   Ht 5' 7.5" (1.715 m)   Wt 192 lb 6.4 oz (87.3 kg)   SpO2 98%   BMI 29.69 kg/m  Gen: NAD, resting comfortably CV: RRR no murmurs rubs or gallops Lungs: CTAB no crackles, wheeze, rhonchi Abdomen: soft/nontender/nondistended/normal bowel sounds. overweight Ext: no edema Skin: warm, dry  Assessment/Plan:  Hypertension S: controlled on lisinopril 40mg . Home #s 123/73.  BP Readings from Last 3 Encounters:  08/23/17 118/78  02/22/17 126/72  12/28/16 120/72  A/P: We discussed blood pressure goal of <140/90. Continue current meds  Hyperlipidemia S: reasonably  controlled on pravastatin 40mg  with LDL <100 at 85. No myalgias. Also on colestipol but mainly for IBS A/P: Weight down another 1 lb- encouraged more weight loss and continued statin use.  Would be even better if could get LDL under 70.   Depression S:  Patient compliant with lexapro 20mg  and wellbutrin 150mg . Last visit phq9 was at 6 but answered 0 for questions 1,2, 9. Sleep has been his main issue driving higher score.   We opted to add melatonin 1-5mg  nightly and encouraged consistent bedtime. Also discussed benadryl 25mg  perhaps twice a week. Discussed ambien as back up option  Today phq9 of 3 with score of 1 of 3,4,5. Melatonin caused skin irritation on face. Went to once a week benadryl and helps a lot.  A/P: continue current medications.   Usually winters are harder for him- discussed trying to maximize sunlight in his day.     6 month CPE  Return precautions advised.  [name], MD

## 2017-08-23 NOTE — Assessment & Plan Note (Signed)
S: reasonably  controlled on pravastatin 40mg  with LDL <100 at 85. No myalgias. Also on colestipol but mainly for IBS A/P: Weight down another 1 lb- encouraged more weight loss and continued statin use. Would be even better if could get LDL under 70.

## 2017-08-29 DIAGNOSIS — J3089 Other allergic rhinitis: Secondary | ICD-10-CM | POA: Diagnosis not present

## 2017-08-29 DIAGNOSIS — J301 Allergic rhinitis due to pollen: Secondary | ICD-10-CM | POA: Diagnosis not present

## 2017-08-29 DIAGNOSIS — J3081 Allergic rhinitis due to animal (cat) (dog) hair and dander: Secondary | ICD-10-CM | POA: Diagnosis not present

## 2017-09-05 DIAGNOSIS — J3089 Other allergic rhinitis: Secondary | ICD-10-CM | POA: Diagnosis not present

## 2017-09-05 DIAGNOSIS — J3081 Allergic rhinitis due to animal (cat) (dog) hair and dander: Secondary | ICD-10-CM | POA: Diagnosis not present

## 2017-09-05 DIAGNOSIS — J301 Allergic rhinitis due to pollen: Secondary | ICD-10-CM | POA: Diagnosis not present

## 2017-09-12 DIAGNOSIS — J3089 Other allergic rhinitis: Secondary | ICD-10-CM | POA: Diagnosis not present

## 2017-09-12 DIAGNOSIS — J301 Allergic rhinitis due to pollen: Secondary | ICD-10-CM | POA: Diagnosis not present

## 2017-09-12 DIAGNOSIS — J3081 Allergic rhinitis due to animal (cat) (dog) hair and dander: Secondary | ICD-10-CM | POA: Diagnosis not present

## 2017-09-19 DIAGNOSIS — J3081 Allergic rhinitis due to animal (cat) (dog) hair and dander: Secondary | ICD-10-CM | POA: Diagnosis not present

## 2017-09-19 DIAGNOSIS — J3089 Other allergic rhinitis: Secondary | ICD-10-CM | POA: Diagnosis not present

## 2017-09-19 DIAGNOSIS — J301 Allergic rhinitis due to pollen: Secondary | ICD-10-CM | POA: Diagnosis not present

## 2017-09-22 DIAGNOSIS — J3089 Other allergic rhinitis: Secondary | ICD-10-CM | POA: Diagnosis not present

## 2017-09-22 DIAGNOSIS — J301 Allergic rhinitis due to pollen: Secondary | ICD-10-CM | POA: Diagnosis not present

## 2017-09-22 DIAGNOSIS — J3081 Allergic rhinitis due to animal (cat) (dog) hair and dander: Secondary | ICD-10-CM | POA: Diagnosis not present

## 2017-09-28 DIAGNOSIS — J3089 Other allergic rhinitis: Secondary | ICD-10-CM | POA: Diagnosis not present

## 2017-09-28 DIAGNOSIS — J3081 Allergic rhinitis due to animal (cat) (dog) hair and dander: Secondary | ICD-10-CM | POA: Diagnosis not present

## 2017-09-28 DIAGNOSIS — J301 Allergic rhinitis due to pollen: Secondary | ICD-10-CM | POA: Diagnosis not present

## 2017-10-03 DIAGNOSIS — J301 Allergic rhinitis due to pollen: Secondary | ICD-10-CM | POA: Diagnosis not present

## 2017-10-03 DIAGNOSIS — J3081 Allergic rhinitis due to animal (cat) (dog) hair and dander: Secondary | ICD-10-CM | POA: Diagnosis not present

## 2017-10-10 DIAGNOSIS — J301 Allergic rhinitis due to pollen: Secondary | ICD-10-CM | POA: Diagnosis not present

## 2017-10-10 DIAGNOSIS — J3089 Other allergic rhinitis: Secondary | ICD-10-CM | POA: Diagnosis not present

## 2017-10-10 DIAGNOSIS — J3081 Allergic rhinitis due to animal (cat) (dog) hair and dander: Secondary | ICD-10-CM | POA: Diagnosis not present

## 2017-10-17 ENCOUNTER — Other Ambulatory Visit: Payer: Self-pay | Admitting: Family Medicine

## 2017-10-17 DIAGNOSIS — F321 Major depressive disorder, single episode, moderate: Secondary | ICD-10-CM

## 2017-10-20 DIAGNOSIS — J301 Allergic rhinitis due to pollen: Secondary | ICD-10-CM | POA: Diagnosis not present

## 2017-10-20 DIAGNOSIS — J3089 Other allergic rhinitis: Secondary | ICD-10-CM | POA: Diagnosis not present

## 2017-10-20 DIAGNOSIS — J3081 Allergic rhinitis due to animal (cat) (dog) hair and dander: Secondary | ICD-10-CM | POA: Diagnosis not present

## 2017-10-24 DIAGNOSIS — J3089 Other allergic rhinitis: Secondary | ICD-10-CM | POA: Diagnosis not present

## 2017-10-24 DIAGNOSIS — J3081 Allergic rhinitis due to animal (cat) (dog) hair and dander: Secondary | ICD-10-CM | POA: Diagnosis not present

## 2017-10-24 DIAGNOSIS — J301 Allergic rhinitis due to pollen: Secondary | ICD-10-CM | POA: Diagnosis not present

## 2017-10-26 DIAGNOSIS — J3081 Allergic rhinitis due to animal (cat) (dog) hair and dander: Secondary | ICD-10-CM | POA: Diagnosis not present

## 2017-10-26 DIAGNOSIS — J3089 Other allergic rhinitis: Secondary | ICD-10-CM | POA: Diagnosis not present

## 2017-10-26 DIAGNOSIS — J301 Allergic rhinitis due to pollen: Secondary | ICD-10-CM | POA: Diagnosis not present

## 2017-11-07 DIAGNOSIS — J3081 Allergic rhinitis due to animal (cat) (dog) hair and dander: Secondary | ICD-10-CM | POA: Diagnosis not present

## 2017-11-07 DIAGNOSIS — J3089 Other allergic rhinitis: Secondary | ICD-10-CM | POA: Diagnosis not present

## 2017-11-07 DIAGNOSIS — J301 Allergic rhinitis due to pollen: Secondary | ICD-10-CM | POA: Diagnosis not present

## 2017-11-10 ENCOUNTER — Other Ambulatory Visit: Payer: Self-pay | Admitting: Family Medicine

## 2017-11-11 DIAGNOSIS — J3089 Other allergic rhinitis: Secondary | ICD-10-CM | POA: Diagnosis not present

## 2017-11-11 DIAGNOSIS — J3081 Allergic rhinitis due to animal (cat) (dog) hair and dander: Secondary | ICD-10-CM | POA: Diagnosis not present

## 2017-11-11 DIAGNOSIS — J301 Allergic rhinitis due to pollen: Secondary | ICD-10-CM | POA: Diagnosis not present

## 2017-11-17 DIAGNOSIS — J3081 Allergic rhinitis due to animal (cat) (dog) hair and dander: Secondary | ICD-10-CM | POA: Diagnosis not present

## 2017-11-17 DIAGNOSIS — J301 Allergic rhinitis due to pollen: Secondary | ICD-10-CM | POA: Diagnosis not present

## 2017-11-17 DIAGNOSIS — J3089 Other allergic rhinitis: Secondary | ICD-10-CM | POA: Diagnosis not present

## 2017-11-28 DIAGNOSIS — J3089 Other allergic rhinitis: Secondary | ICD-10-CM | POA: Diagnosis not present

## 2017-11-28 DIAGNOSIS — J3081 Allergic rhinitis due to animal (cat) (dog) hair and dander: Secondary | ICD-10-CM | POA: Diagnosis not present

## 2017-11-28 DIAGNOSIS — J301 Allergic rhinitis due to pollen: Secondary | ICD-10-CM | POA: Diagnosis not present

## 2017-12-05 DIAGNOSIS — J3089 Other allergic rhinitis: Secondary | ICD-10-CM | POA: Diagnosis not present

## 2017-12-05 DIAGNOSIS — J301 Allergic rhinitis due to pollen: Secondary | ICD-10-CM | POA: Diagnosis not present

## 2017-12-05 DIAGNOSIS — J3081 Allergic rhinitis due to animal (cat) (dog) hair and dander: Secondary | ICD-10-CM | POA: Diagnosis not present

## 2017-12-13 DIAGNOSIS — J301 Allergic rhinitis due to pollen: Secondary | ICD-10-CM | POA: Diagnosis not present

## 2017-12-13 DIAGNOSIS — J3081 Allergic rhinitis due to animal (cat) (dog) hair and dander: Secondary | ICD-10-CM | POA: Diagnosis not present

## 2017-12-13 DIAGNOSIS — J3089 Other allergic rhinitis: Secondary | ICD-10-CM | POA: Diagnosis not present

## 2017-12-19 DIAGNOSIS — J3081 Allergic rhinitis due to animal (cat) (dog) hair and dander: Secondary | ICD-10-CM | POA: Diagnosis not present

## 2017-12-19 DIAGNOSIS — J3089 Other allergic rhinitis: Secondary | ICD-10-CM | POA: Diagnosis not present

## 2017-12-19 DIAGNOSIS — J301 Allergic rhinitis due to pollen: Secondary | ICD-10-CM | POA: Diagnosis not present

## 2017-12-26 DIAGNOSIS — J3089 Other allergic rhinitis: Secondary | ICD-10-CM | POA: Diagnosis not present

## 2017-12-26 DIAGNOSIS — J301 Allergic rhinitis due to pollen: Secondary | ICD-10-CM | POA: Diagnosis not present

## 2017-12-26 DIAGNOSIS — J3081 Allergic rhinitis due to animal (cat) (dog) hair and dander: Secondary | ICD-10-CM | POA: Diagnosis not present

## 2017-12-27 DIAGNOSIS — J3081 Allergic rhinitis due to animal (cat) (dog) hair and dander: Secondary | ICD-10-CM | POA: Diagnosis not present

## 2017-12-27 DIAGNOSIS — J301 Allergic rhinitis due to pollen: Secondary | ICD-10-CM | POA: Diagnosis not present

## 2017-12-27 DIAGNOSIS — J3089 Other allergic rhinitis: Secondary | ICD-10-CM | POA: Diagnosis not present

## 2018-01-02 DIAGNOSIS — J301 Allergic rhinitis due to pollen: Secondary | ICD-10-CM | POA: Diagnosis not present

## 2018-01-02 DIAGNOSIS — J3089 Other allergic rhinitis: Secondary | ICD-10-CM | POA: Diagnosis not present

## 2018-01-02 DIAGNOSIS — J3081 Allergic rhinitis due to animal (cat) (dog) hair and dander: Secondary | ICD-10-CM | POA: Diagnosis not present

## 2018-01-09 DIAGNOSIS — J301 Allergic rhinitis due to pollen: Secondary | ICD-10-CM | POA: Diagnosis not present

## 2018-01-09 DIAGNOSIS — J3081 Allergic rhinitis due to animal (cat) (dog) hair and dander: Secondary | ICD-10-CM | POA: Diagnosis not present

## 2018-01-09 DIAGNOSIS — J3089 Other allergic rhinitis: Secondary | ICD-10-CM | POA: Diagnosis not present

## 2018-01-16 DIAGNOSIS — J3081 Allergic rhinitis due to animal (cat) (dog) hair and dander: Secondary | ICD-10-CM | POA: Diagnosis not present

## 2018-01-16 DIAGNOSIS — J301 Allergic rhinitis due to pollen: Secondary | ICD-10-CM | POA: Diagnosis not present

## 2018-01-16 DIAGNOSIS — J3089 Other allergic rhinitis: Secondary | ICD-10-CM | POA: Diagnosis not present

## 2018-01-20 DIAGNOSIS — J3089 Other allergic rhinitis: Secondary | ICD-10-CM | POA: Diagnosis not present

## 2018-01-20 DIAGNOSIS — J301 Allergic rhinitis due to pollen: Secondary | ICD-10-CM | POA: Diagnosis not present

## 2018-01-20 DIAGNOSIS — J3081 Allergic rhinitis due to animal (cat) (dog) hair and dander: Secondary | ICD-10-CM | POA: Diagnosis not present

## 2018-01-26 DIAGNOSIS — J3089 Other allergic rhinitis: Secondary | ICD-10-CM | POA: Diagnosis not present

## 2018-01-26 DIAGNOSIS — J301 Allergic rhinitis due to pollen: Secondary | ICD-10-CM | POA: Diagnosis not present

## 2018-01-30 ENCOUNTER — Other Ambulatory Visit: Payer: Self-pay | Admitting: Family Medicine

## 2018-01-30 DIAGNOSIS — J3081 Allergic rhinitis due to animal (cat) (dog) hair and dander: Secondary | ICD-10-CM | POA: Diagnosis not present

## 2018-01-30 DIAGNOSIS — J3089 Other allergic rhinitis: Secondary | ICD-10-CM | POA: Diagnosis not present

## 2018-01-30 DIAGNOSIS — J301 Allergic rhinitis due to pollen: Secondary | ICD-10-CM | POA: Diagnosis not present

## 2018-02-03 DIAGNOSIS — J3081 Allergic rhinitis due to animal (cat) (dog) hair and dander: Secondary | ICD-10-CM | POA: Diagnosis not present

## 2018-02-03 DIAGNOSIS — J3089 Other allergic rhinitis: Secondary | ICD-10-CM | POA: Diagnosis not present

## 2018-02-03 DIAGNOSIS — J301 Allergic rhinitis due to pollen: Secondary | ICD-10-CM | POA: Diagnosis not present

## 2018-02-06 DIAGNOSIS — J301 Allergic rhinitis due to pollen: Secondary | ICD-10-CM | POA: Diagnosis not present

## 2018-02-06 DIAGNOSIS — J3081 Allergic rhinitis due to animal (cat) (dog) hair and dander: Secondary | ICD-10-CM | POA: Diagnosis not present

## 2018-02-06 DIAGNOSIS — J3089 Other allergic rhinitis: Secondary | ICD-10-CM | POA: Diagnosis not present

## 2018-02-08 DIAGNOSIS — J3089 Other allergic rhinitis: Secondary | ICD-10-CM | POA: Diagnosis not present

## 2018-02-08 DIAGNOSIS — J301 Allergic rhinitis due to pollen: Secondary | ICD-10-CM | POA: Diagnosis not present

## 2018-02-08 DIAGNOSIS — J3081 Allergic rhinitis due to animal (cat) (dog) hair and dander: Secondary | ICD-10-CM | POA: Diagnosis not present

## 2018-02-14 DIAGNOSIS — J3081 Allergic rhinitis due to animal (cat) (dog) hair and dander: Secondary | ICD-10-CM | POA: Diagnosis not present

## 2018-02-14 DIAGNOSIS — J3089 Other allergic rhinitis: Secondary | ICD-10-CM | POA: Diagnosis not present

## 2018-02-14 DIAGNOSIS — J301 Allergic rhinitis due to pollen: Secondary | ICD-10-CM | POA: Diagnosis not present

## 2018-02-21 ENCOUNTER — Encounter: Payer: Self-pay | Admitting: Family Medicine

## 2018-02-21 ENCOUNTER — Ambulatory Visit (INDEPENDENT_AMBULATORY_CARE_PROVIDER_SITE_OTHER): Payer: BLUE CROSS/BLUE SHIELD | Admitting: Family Medicine

## 2018-02-21 VITALS — BP 118/78 | HR 87 | Temp 98.6°F | Ht 67.5 in | Wt 197.0 lb

## 2018-02-21 DIAGNOSIS — Z0000 Encounter for general adult medical examination without abnormal findings: Secondary | ICD-10-CM

## 2018-02-21 DIAGNOSIS — I10 Essential (primary) hypertension: Secondary | ICD-10-CM

## 2018-02-21 DIAGNOSIS — F325 Major depressive disorder, single episode, in full remission: Secondary | ICD-10-CM

## 2018-02-21 DIAGNOSIS — K219 Gastro-esophageal reflux disease without esophagitis: Secondary | ICD-10-CM

## 2018-02-21 DIAGNOSIS — E785 Hyperlipidemia, unspecified: Secondary | ICD-10-CM | POA: Diagnosis not present

## 2018-02-21 DIAGNOSIS — J301 Allergic rhinitis due to pollen: Secondary | ICD-10-CM

## 2018-02-21 LAB — COMPREHENSIVE METABOLIC PANEL
ALT: 16 U/L (ref 0–53)
AST: 15 U/L (ref 0–37)
Albumin: 4.6 g/dL (ref 3.5–5.2)
Alkaline Phosphatase: 62 U/L (ref 39–117)
BILIRUBIN TOTAL: 0.8 mg/dL (ref 0.2–1.2)
BUN: 9 mg/dL (ref 6–23)
CO2: 29 meq/L (ref 19–32)
CREATININE: 0.83 mg/dL (ref 0.40–1.50)
Calcium: 9.7 mg/dL (ref 8.4–10.5)
Chloride: 97 mEq/L (ref 96–112)
GFR: 107.87 mL/min (ref >60.00)
GLUCOSE: 83 mg/dL (ref 70–99)
Potassium: 4.1 mEq/L (ref 3.5–5.1)
Sodium: 134 mEq/L — ABNORMAL LOW (ref 135–145)
Total Protein: 7.4 g/dL (ref 6.0–8.3)

## 2018-02-21 LAB — LIPID PANEL
Cholesterol: 147 mg/dL (ref 0–200)
HDL: 40.7 mg/dL (ref >39.00)
NONHDL: 106.6
Total CHOL/HDL Ratio: 4
Triglycerides: 220 mg/dL — ABNORMAL HIGH (ref 0.0–149.0)
VLDL: 44 mg/dL — ABNORMAL HIGH (ref 0.0–40.0)

## 2018-02-21 LAB — CBC
HCT: 39.4 % (ref 39.0–52.0)
HEMOGLOBIN: 13.3 g/dL (ref 13.0–17.0)
MCHC: 33.7 g/dL (ref 30.0–36.0)
MCV: 81.9 fl (ref 78.0–100.0)
Platelets: 237 10*3/uL (ref 150.0–400.0)
RBC: 4.82 Mil/uL (ref 4.22–5.81)
RDW: 13.2 % (ref 11.5–15.5)
WBC: 5.3 10*3/uL (ref 4.0–10.5)

## 2018-02-21 LAB — LDL CHOLESTEROL, DIRECT: LDL DIRECT: 83 mg/dL

## 2018-02-21 NOTE — Assessment & Plan Note (Signed)
GERD- on omeprazole 20mg , doing well

## 2018-02-21 NOTE — Patient Instructions (Addendum)
Weight watchers is a good program. Dash diet and Mediterranean diet are both heart healthy. Strongly advise finding time for you- to exercise with goal over next year getting to 150 minutes a week.   See you back in 6 months- I want you to work on trimming 10 lbs off in that time frame.   Please stop by lab before you go  [facility] or [facility] can refill any meds you need for up to a year from today

## 2018-02-21 NOTE — Assessment & Plan Note (Signed)
controlled on wellbutrin 150mg  and lexapro 20mg . PHQ9 today of 4. Benadryl once a week still helping with sleep

## 2018-02-21 NOTE — Assessment & Plan Note (Signed)
HLD- controlled on pravastatin 40mg . Need to update lipids.

## 2018-02-21 NOTE — Assessment & Plan Note (Signed)
Allergies- on rhinocort, astelin, clarinex. Still at allergy clinic

## 2018-02-21 NOTE — Progress Notes (Signed)
Phone: [phone]  Subjective:  Patient presents today for their annual physical. Chief complaint-noted.   See problem oriented charting- ROS- full  review of systems was completed and negative except for: occasional hemorrhoids, some overeating with stress. No chest pain or shortness of breath  The following were reviewed and entered/updated in epic: Past Medical History:  Diagnosis Date  . Acne   . Allergy   . Anxiety   . Depression   . External hemorrhoids    2004  . GERD (gastroesophageal reflux disease)   . HLD (hyperlipidemia)   . Irritable bowel syndrome   . Strabismus    Patient Active Problem List   Diagnosis Date Noted  . Hypertension 02/19/2014    Priority: Medium  . IBS (irritable bowel syndrome) diarrhea predominant 08/14/2012    Priority: Medium  . Allergic rhinitis 02/21/2012    Priority: Medium  . Hyperlipidemia 09/05/2007    Priority: Medium  . Depression 05/25/2007    Priority: Medium  . GERD (gastroesophageal reflux disease) 11/29/2012    Priority: Low   Past Surgical History:  Procedure Laterality Date  . COLONOSCOPY  10/16/12  . FLEXIBLE SIGMOIDOSCOPY     hemorrhoids-rule out internal bleeding  . STRABISMUS SURGERY  2001, 2010   X 2  . WISDOM TOOTH EXTRACTION      Family History  Problem Relation Age of Onset  . Breast cancer Mother   . Hypertension Mother   . Hyperlipidemia Mother   . Prostate cancer Father        late 87s  . Ulcerative colitis Father   . Hypertension Father   . Hyperlipidemia Father   . Colon cancer Neg Hx   . Rectal cancer Neg Hx   . Stomach cancer Neg Hx   . Esophageal cancer Neg Hx     Medications- reviewed and updated Current Outpatient Medications  Medication Sig Dispense Refill  . Alpha-D-Galactosidase (BEANO PO) Take by mouth as needed.    Marland Kitchen azelastine (ASTELIN) 137 MCG/SPRAY nasal spray 1 spray by Nasal route 2 (two) times daily. Use in each nostril as directed     . budesonide (RHINOCORT AQUA) 32  MCG/ACT nasal spray 1 spray by Nasal route daily.      Marland Kitchen buPROPion (WELLBUTRIN XL) 150 MG 24 hr tablet TAKE ONE TABLET BY MOUTH ONCE DAILY 90 tablet 2  . Calcium Carbonate Antacid (TUMS PO) Take by mouth as needed.    . colestipol (COLESTID) 1 g tablet TAKE TWO TABLETS BY MOUTH TWICE DAILY 120 tablet 11  . desloratadine (CLARINEX) 5 MG tablet Take 5 mg by mouth daily.      Marland Kitchen EPINEPHrine 0.3 mg/0.3 mL IJ SOAJ injection Inject 0.3 mg into the muscle as needed.  0  . escitalopram (LEXAPRO) 20 MG tablet TAKE 1 TABLET BY MOUTH ONCE DAILY 30 tablet 5  . Flavoring Agent (PEPPERMINT FLAVOR) OIL Take 1 capsule by mouth daily.    . hyoscyamine (LEVSIN SL) 0.125 MG SL tablet Place 1 tablet (0.125 mg total) under the tongue every 4 (four) hours as needed. 60 tablet 5  . lisinopril (PRINIVIL,ZESTRIL) 40 MG tablet TAKE 1 TABLET BY MOUTH ONCE DAILY 30 tablet 5  . Loperamide HCl (IMODIUM A-D PO) Take by mouth as needed.    . NON FORMULARY Allergy shots weekly    . Omega-3 Fatty Acids (FISH OIL) 1200 MG CAPS Take 1 capsule by mouth daily.    Marland Kitchen omeprazole (PRILOSEC) 20 MG capsule TAKE ONE CAPSULE BY MOUTH ONCE DAILY  60  MINUTES  BEFORE  BREAKFAST 90 capsule 3  . pravastatin (PRAVACHOL) 40 MG tablet TAKE ONE TABLET BY MOUTH ONCE DAILY 90 tablet 2   No current facility-administered medications for this visit.     Allergies-reviewed and updated No Known Allergies  Social History   Social History Narrative   Single. Live with parents.       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 Oropharynx normal CV: RRR no murmurs rubs or gallops Lungs: CTAB no crackles,  wheeze, rhonchi Abdomen: soft/nontender/nondistended/normal bowel sounds. No rebound or guarding.  Ext: no edema Skin: warm, dry Neuro: grossly normal, moves all extremities, PERRLA  Assessment/Plan:  42 y.o. male presenting for annual physical.  Health Maintenance counseling: 1. Anticipatory guidance: Patient counseled regarding regular dental exams -q6 months, eye exams - every 2 years, wearing seatbelts.  2. Risk factor reduction:  Advised patient of need for regular exercise and diet rich and fruits and vegetables to reduce risk of heart attack and stroke. Weight up- admits to stress eating. Little time for exercise with caregiving for parents. Did weight watchers years ago-  Wt Readings from Last 3 Encounters:  02/21/18 197 lb (89.4 kg)  08/23/17 192 lb 6.4 oz (87.3 kg)  02/22/17 193 lb 3.2 oz (87.6 kg)  3. Immunizations/screenings/ancillary studies- up to date.  Immunization History  Administered Date(s) Administered  . Influenza Split 09/11/2012  . Influenza Whole 08/22/2005, 09/04/2007, 08/03/2010  . Influenza,inj,Quad PF,6+ Mos 08/09/2013, 09/08/2016, 07/19/2017  . Influenza-Unspecified 09/05/2014, 08/21/2015  . Td 01/29/2010  . Tdap 03/12/2008  4. Prostate cancer screening- Dad with prostate cancer in late 37s- start at 20 or 22   5. Colon cancer screening - no family history, likely start age 6 6. Skin cancer screening/prevention- sees Dr. Elvera Lennox yearly. advised regular sunscreen use. Denies worrisome, changing, or new skin lesions.  7. Testicular cancer screening- advised monthly self exams  8. STD screening- patient opts out   Status of chronic or acute concerns   Depression controlled on wellbutrin 150mg  and lexapro 20mg . PHQ9 today of 4. Benadryl once a week still helping with sleep  Hyperlipidemia HLD- controlled on pravastatin 40mg . Need to update lipids.   Hypertension HTN- controlled on liisnopril 40mg   GERD (gastroesophageal reflux disease) GERD- on  omeprazole 20mg , doing well  Allergic rhinitis Allergies- on rhinocort, astelin, clarinex. Still at allergy clinic   No future appointments. Return in about 6 months (around 08/23/2018).  Lab/Order associations: Hyperlipidemia, unspecified hyperlipidemia type - Plan: CBC, Comprehensive metabolic panel, Lipid panel  Return precautions advised.   [name], MD

## 2018-02-21 NOTE — Assessment & Plan Note (Signed)
HTN- controlled on liisnopril 40mg 

## 2018-02-22 DIAGNOSIS — J3081 Allergic rhinitis due to animal (cat) (dog) hair and dander: Secondary | ICD-10-CM | POA: Diagnosis not present

## 2018-02-22 DIAGNOSIS — J301 Allergic rhinitis due to pollen: Secondary | ICD-10-CM | POA: Diagnosis not present

## 2018-02-22 DIAGNOSIS — J3089 Other allergic rhinitis: Secondary | ICD-10-CM | POA: Diagnosis not present

## 2018-03-01 DIAGNOSIS — J301 Allergic rhinitis due to pollen: Secondary | ICD-10-CM | POA: Diagnosis not present

## 2018-03-01 DIAGNOSIS — J3089 Other allergic rhinitis: Secondary | ICD-10-CM | POA: Diagnosis not present

## 2018-03-01 DIAGNOSIS — J3081 Allergic rhinitis due to animal (cat) (dog) hair and dander: Secondary | ICD-10-CM | POA: Diagnosis not present

## 2018-03-08 DIAGNOSIS — J301 Allergic rhinitis due to pollen: Secondary | ICD-10-CM | POA: Diagnosis not present

## 2018-03-08 DIAGNOSIS — J3089 Other allergic rhinitis: Secondary | ICD-10-CM | POA: Diagnosis not present

## 2018-03-08 DIAGNOSIS — J3081 Allergic rhinitis due to animal (cat) (dog) hair and dander: Secondary | ICD-10-CM | POA: Diagnosis not present

## 2018-03-13 ENCOUNTER — Other Ambulatory Visit: Payer: Self-pay | Admitting: Family Medicine

## 2018-03-16 DIAGNOSIS — J3089 Other allergic rhinitis: Secondary | ICD-10-CM | POA: Diagnosis not present

## 2018-03-16 DIAGNOSIS — J301 Allergic rhinitis due to pollen: Secondary | ICD-10-CM | POA: Diagnosis not present

## 2018-03-16 DIAGNOSIS — J3081 Allergic rhinitis due to animal (cat) (dog) hair and dander: Secondary | ICD-10-CM | POA: Diagnosis not present

## 2018-03-20 ENCOUNTER — Other Ambulatory Visit: Payer: Self-pay | Admitting: Family Medicine

## 2018-03-20 DIAGNOSIS — J3089 Other allergic rhinitis: Secondary | ICD-10-CM | POA: Diagnosis not present

## 2018-03-20 DIAGNOSIS — J3081 Allergic rhinitis due to animal (cat) (dog) hair and dander: Secondary | ICD-10-CM | POA: Diagnosis not present

## 2018-03-20 DIAGNOSIS — J301 Allergic rhinitis due to pollen: Secondary | ICD-10-CM | POA: Diagnosis not present

## 2018-03-27 DIAGNOSIS — J3081 Allergic rhinitis due to animal (cat) (dog) hair and dander: Secondary | ICD-10-CM | POA: Diagnosis not present

## 2018-03-27 DIAGNOSIS — J3089 Other allergic rhinitis: Secondary | ICD-10-CM | POA: Diagnosis not present

## 2018-03-27 DIAGNOSIS — J301 Allergic rhinitis due to pollen: Secondary | ICD-10-CM | POA: Diagnosis not present

## 2018-03-31 DIAGNOSIS — J3081 Allergic rhinitis due to animal (cat) (dog) hair and dander: Secondary | ICD-10-CM | POA: Diagnosis not present

## 2018-03-31 DIAGNOSIS — J301 Allergic rhinitis due to pollen: Secondary | ICD-10-CM | POA: Diagnosis not present

## 2018-03-31 DIAGNOSIS — J3089 Other allergic rhinitis: Secondary | ICD-10-CM | POA: Diagnosis not present

## 2018-04-10 DIAGNOSIS — J3081 Allergic rhinitis due to animal (cat) (dog) hair and dander: Secondary | ICD-10-CM | POA: Diagnosis not present

## 2018-04-10 DIAGNOSIS — J301 Allergic rhinitis due to pollen: Secondary | ICD-10-CM | POA: Diagnosis not present

## 2018-04-11 DIAGNOSIS — J3089 Other allergic rhinitis: Secondary | ICD-10-CM | POA: Diagnosis not present

## 2018-04-12 DIAGNOSIS — J3081 Allergic rhinitis due to animal (cat) (dog) hair and dander: Secondary | ICD-10-CM | POA: Diagnosis not present

## 2018-04-12 DIAGNOSIS — J3089 Other allergic rhinitis: Secondary | ICD-10-CM | POA: Diagnosis not present

## 2018-04-12 DIAGNOSIS — J301 Allergic rhinitis due to pollen: Secondary | ICD-10-CM | POA: Diagnosis not present

## 2018-04-14 DIAGNOSIS — J301 Allergic rhinitis due to pollen: Secondary | ICD-10-CM | POA: Diagnosis not present

## 2018-04-14 DIAGNOSIS — J3081 Allergic rhinitis due to animal (cat) (dog) hair and dander: Secondary | ICD-10-CM | POA: Diagnosis not present

## 2018-04-14 DIAGNOSIS — J3089 Other allergic rhinitis: Secondary | ICD-10-CM | POA: Diagnosis not present

## 2018-04-18 DIAGNOSIS — J3089 Other allergic rhinitis: Secondary | ICD-10-CM | POA: Diagnosis not present

## 2018-04-18 DIAGNOSIS — J301 Allergic rhinitis due to pollen: Secondary | ICD-10-CM | POA: Diagnosis not present

## 2018-04-18 DIAGNOSIS — J3081 Allergic rhinitis due to animal (cat) (dog) hair and dander: Secondary | ICD-10-CM | POA: Diagnosis not present

## 2018-04-20 DIAGNOSIS — J3089 Other allergic rhinitis: Secondary | ICD-10-CM | POA: Diagnosis not present

## 2018-04-20 DIAGNOSIS — J3081 Allergic rhinitis due to animal (cat) (dog) hair and dander: Secondary | ICD-10-CM | POA: Diagnosis not present

## 2018-04-20 DIAGNOSIS — J301 Allergic rhinitis due to pollen: Secondary | ICD-10-CM | POA: Diagnosis not present

## 2018-04-24 ENCOUNTER — Other Ambulatory Visit: Payer: Self-pay | Admitting: Family Medicine

## 2018-04-24 DIAGNOSIS — F321 Major depressive disorder, single episode, moderate: Secondary | ICD-10-CM

## 2018-04-26 DIAGNOSIS — J3089 Other allergic rhinitis: Secondary | ICD-10-CM | POA: Diagnosis not present

## 2018-04-26 DIAGNOSIS — J3081 Allergic rhinitis due to animal (cat) (dog) hair and dander: Secondary | ICD-10-CM | POA: Diagnosis not present

## 2018-04-26 DIAGNOSIS — J301 Allergic rhinitis due to pollen: Secondary | ICD-10-CM | POA: Diagnosis not present

## 2018-04-28 DIAGNOSIS — J3081 Allergic rhinitis due to animal (cat) (dog) hair and dander: Secondary | ICD-10-CM | POA: Diagnosis not present

## 2018-04-28 DIAGNOSIS — J3089 Other allergic rhinitis: Secondary | ICD-10-CM | POA: Diagnosis not present

## 2018-04-28 DIAGNOSIS — J301 Allergic rhinitis due to pollen: Secondary | ICD-10-CM | POA: Diagnosis not present

## 2018-05-08 DIAGNOSIS — J301 Allergic rhinitis due to pollen: Secondary | ICD-10-CM | POA: Diagnosis not present

## 2018-05-08 DIAGNOSIS — J3089 Other allergic rhinitis: Secondary | ICD-10-CM | POA: Diagnosis not present

## 2018-05-08 DIAGNOSIS — J3081 Allergic rhinitis due to animal (cat) (dog) hair and dander: Secondary | ICD-10-CM | POA: Diagnosis not present

## 2018-05-24 DIAGNOSIS — J3089 Other allergic rhinitis: Secondary | ICD-10-CM | POA: Diagnosis not present

## 2018-05-24 DIAGNOSIS — J301 Allergic rhinitis due to pollen: Secondary | ICD-10-CM | POA: Diagnosis not present

## 2018-05-24 DIAGNOSIS — J3081 Allergic rhinitis due to animal (cat) (dog) hair and dander: Secondary | ICD-10-CM | POA: Diagnosis not present

## 2018-06-05 DIAGNOSIS — J3089 Other allergic rhinitis: Secondary | ICD-10-CM | POA: Diagnosis not present

## 2018-06-05 DIAGNOSIS — J301 Allergic rhinitis due to pollen: Secondary | ICD-10-CM | POA: Diagnosis not present

## 2018-06-05 DIAGNOSIS — J3081 Allergic rhinitis due to animal (cat) (dog) hair and dander: Secondary | ICD-10-CM | POA: Diagnosis not present

## 2018-06-19 DIAGNOSIS — J3089 Other allergic rhinitis: Secondary | ICD-10-CM | POA: Diagnosis not present

## 2018-06-19 DIAGNOSIS — J301 Allergic rhinitis due to pollen: Secondary | ICD-10-CM | POA: Diagnosis not present

## 2018-06-19 DIAGNOSIS — J3081 Allergic rhinitis due to animal (cat) (dog) hair and dander: Secondary | ICD-10-CM | POA: Diagnosis not present

## 2018-07-04 DIAGNOSIS — J301 Allergic rhinitis due to pollen: Secondary | ICD-10-CM | POA: Diagnosis not present

## 2018-07-04 DIAGNOSIS — J3089 Other allergic rhinitis: Secondary | ICD-10-CM | POA: Diagnosis not present

## 2018-07-04 DIAGNOSIS — J3081 Allergic rhinitis due to animal (cat) (dog) hair and dander: Secondary | ICD-10-CM | POA: Diagnosis not present

## 2018-07-19 DIAGNOSIS — J301 Allergic rhinitis due to pollen: Secondary | ICD-10-CM | POA: Diagnosis not present

## 2018-07-19 DIAGNOSIS — J3081 Allergic rhinitis due to animal (cat) (dog) hair and dander: Secondary | ICD-10-CM | POA: Diagnosis not present

## 2018-07-19 DIAGNOSIS — J3089 Other allergic rhinitis: Secondary | ICD-10-CM | POA: Diagnosis not present

## 2018-07-31 ENCOUNTER — Other Ambulatory Visit: Payer: Self-pay | Admitting: Family Medicine

## 2018-07-31 DIAGNOSIS — J3081 Allergic rhinitis due to animal (cat) (dog) hair and dander: Secondary | ICD-10-CM | POA: Diagnosis not present

## 2018-07-31 DIAGNOSIS — J3089 Other allergic rhinitis: Secondary | ICD-10-CM | POA: Diagnosis not present

## 2018-07-31 DIAGNOSIS — J301 Allergic rhinitis due to pollen: Secondary | ICD-10-CM | POA: Diagnosis not present

## 2018-08-14 DIAGNOSIS — J301 Allergic rhinitis due to pollen: Secondary | ICD-10-CM | POA: Diagnosis not present

## 2018-08-14 DIAGNOSIS — J3081 Allergic rhinitis due to animal (cat) (dog) hair and dander: Secondary | ICD-10-CM | POA: Diagnosis not present

## 2018-08-14 DIAGNOSIS — J3089 Other allergic rhinitis: Secondary | ICD-10-CM | POA: Diagnosis not present

## 2018-08-24 ENCOUNTER — Ambulatory Visit: Payer: BLUE CROSS/BLUE SHIELD | Admitting: Family Medicine

## 2018-08-28 DIAGNOSIS — J3081 Allergic rhinitis due to animal (cat) (dog) hair and dander: Secondary | ICD-10-CM | POA: Diagnosis not present

## 2018-08-28 DIAGNOSIS — J3089 Other allergic rhinitis: Secondary | ICD-10-CM | POA: Diagnosis not present

## 2018-08-28 DIAGNOSIS — J301 Allergic rhinitis due to pollen: Secondary | ICD-10-CM | POA: Diagnosis not present

## 2018-09-11 DIAGNOSIS — J301 Allergic rhinitis due to pollen: Secondary | ICD-10-CM | POA: Diagnosis not present

## 2018-09-11 DIAGNOSIS — J3081 Allergic rhinitis due to animal (cat) (dog) hair and dander: Secondary | ICD-10-CM | POA: Diagnosis not present

## 2018-09-11 DIAGNOSIS — J3089 Other allergic rhinitis: Secondary | ICD-10-CM | POA: Diagnosis not present

## 2018-09-25 DIAGNOSIS — J3089 Other allergic rhinitis: Secondary | ICD-10-CM | POA: Diagnosis not present

## 2018-09-25 DIAGNOSIS — J3081 Allergic rhinitis due to animal (cat) (dog) hair and dander: Secondary | ICD-10-CM | POA: Diagnosis not present

## 2018-09-25 DIAGNOSIS — J301 Allergic rhinitis due to pollen: Secondary | ICD-10-CM | POA: Diagnosis not present

## 2018-09-26 ENCOUNTER — Ambulatory Visit (INDEPENDENT_AMBULATORY_CARE_PROVIDER_SITE_OTHER): Payer: BLUE CROSS/BLUE SHIELD | Admitting: Family Medicine

## 2018-09-26 ENCOUNTER — Encounter: Payer: Self-pay | Admitting: Family Medicine

## 2018-09-26 VITALS — BP 138/80 | HR 98 | Temp 98.0°F | Ht 67.5 in | Wt 198.0 lb

## 2018-09-26 DIAGNOSIS — F325 Major depressive disorder, single episode, in full remission: Secondary | ICD-10-CM

## 2018-09-26 DIAGNOSIS — K580 Irritable bowel syndrome with diarrhea: Secondary | ICD-10-CM

## 2018-09-26 DIAGNOSIS — I10 Essential (primary) hypertension: Secondary | ICD-10-CM

## 2018-09-26 DIAGNOSIS — E785 Hyperlipidemia, unspecified: Secondary | ICD-10-CM

## 2018-09-26 NOTE — Assessment & Plan Note (Signed)
S: controlled on lisinopril 40 mg-though high normal.  Weight up 1 pound since last visit-and 6 pounds over a year BP Readings from Last 3 Encounters:  09/26/18 138/80  02/21/18 118/78  08/23/17 118/78  A/P: We discussed blood pressure goal of <140/90. Continue current meds: But we discussed working on weight loss. 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 Portion control- stress eating--> these are topics he wants to work on

## 2018-09-26 NOTE — Assessment & Plan Note (Signed)
S: Patient feels like his symptoms have been reasonably well-controlled on colestipol.  Very sparingly for diarrhea he will use half of on Imodium and this is helpful.  He has hyoscyamine as needed for cramping but does not state he takes this regularly today.  Father with history of ulcerative colitis-patient had a reassuring colonoscopy in 2013 A/P: Stable-continue current medications

## 2018-09-26 NOTE — Progress Notes (Signed)
Subjective:  Garrett [name] is a 42 y.o. year old very pleasant male patient who presents for/with See problem oriented charting ROS- No chest pain or shortness of breath. No headache (other than occasional sinus headache that resolvs with allergy treatment) or blurry vision. IBS has been tolerable recently- colestipol helping  Past Medical History-  Patient Active Problem List   Diagnosis Date Noted  . Hypertension 02/19/2014    Priority: Medium  . IBS (irritable bowel syndrome) diarrhea predominant 08/14/2012    Priority: Medium  . Allergic rhinitis 02/21/2012    Priority: Medium  . Hyperlipidemia 09/05/2007    Priority: Medium  . Depression 05/25/2007    Priority: Medium  . GERD (gastroesophageal reflux disease) 11/29/2012    Priority: Low    Medications- reviewed and updated Current Outpatient Medications  Medication Sig Dispense Refill  . Alpha-D-Galactosidase (BEANO PO) Take by mouth as needed.    Marland Kitchen azelastine (ASTELIN) 137 MCG/SPRAY nasal spray 1 spray by Nasal route 2 (two) times daily. Use in each nostril as directed     . budesonide (RHINOCORT AQUA) 32 MCG/ACT nasal spray 1 spray by Nasal route daily.      Marland Kitchen buPROPion (WELLBUTRIN XL) 150 MG 24 hr tablet TAKE 1 TABLET BY MOUTH ONCE DAILY 90 tablet 2  . Calcium Carbonate Antacid (TUMS PO) Take by mouth as needed.    . colestipol (COLESTID) 1 g tablet TAKE TWO TABLETS BY MOUTH TWICE DAILY 120 tablet 11  . desloratadine (CLARINEX) 5 MG tablet Take 5 mg by mouth daily.      Marland Kitchen EPINEPHrine 0.3 mg/0.3 mL IJ SOAJ injection Inject 0.3 mg into the muscle as needed.  0  . escitalopram (LEXAPRO) 20 MG tablet TAKE 1 TABLET BY MOUTH ONCE DAILY 30 tablet 5  . Flavoring Agent (PEPPERMINT FLAVOR) OIL Take 1 capsule by mouth daily.    . hyoscyamine (LEVSIN SL) 0.125 MG SL tablet Place 1 tablet (0.125 mg total) under the tongue every 4 (four) hours as needed. 60 tablet 5  . lisinopril (PRINIVIL,ZESTRIL) 40 MG tablet TAKE 1 TABLET BY MOUTH  ONCE DAILY 30 tablet 5  . Loperamide HCl (IMODIUM A-D PO) Take by mouth as needed.    . NON FORMULARY Allergy shots weekly    . Omega-3 Fatty Acids (FISH OIL) 1200 MG CAPS Take 1 capsule by mouth daily.    Marland Kitchen omeprazole (PRILOSEC) 20 MG capsule TAKE ONE CAPSULE BY MOUTH ONCE DAILY 60  MINUTES  BEFORE  BREAKFAST 90 capsule 3  . pravastatin (PRAVACHOL) 40 MG tablet TAKE 1 TABLET BY MOUTH ONCE DAILY 90 tablet 2   No current facility-administered medications for this visit.     Objective: BP 138/80 (BP Location: Left Arm, Patient Position: Sitting, Cuff Size: Large)   Pulse 98   Temp 98 F (36.7 C) (Oral)   Ht 5' 7.5" (1.715 m)   Wt 198 lb (89.8 kg)   SpO2 97%   BMI 30.55 kg/m  Gen: NAD, resting comfortably CV: RRR no murmurs rubs or gallops Lungs: CTAB no crackles, wheeze, rhonchi Ext: no edema Skin: warm, dry  Assessment/Plan:  Hyperlipidemia S:  controlled on pravastatin 40 mg.  He takes colestipol but this is for IBS Lab Results  Component Value Date   CHOL 147 02/21/2018   HDL 40.70 02/21/2018   LDLCALC 85 10/26/2016   LDLDIRECT 83.0 02/21/2018   TRIG 220.0 (H) 02/21/2018   CHOLHDL 4 02/21/2018   A/P: Stable with LDL under 100-continue current medications  Depression S: Depression is well controlled on Wellbutrin 150 mg extended release and Lexapro 20 mg.  PHQ 9 of 5 today.  No SI. A/P: Stable-continue current medications  Patient's social interaction is limited.  He is a very caring man-spends a lot of time helping his parents.  I encouraged him to try to make some time for himself to develop local friendships.  He seems at least partially open to this idea  IBS (irritable bowel syndrome) diarrhea predominant S: Patient feels like his symptoms have been reasonably well-controlled on colestipol.  Very sparingly for diarrhea he will use half of on Imodium and this is helpful.  He has hyoscyamine as needed for cramping but does not state he takes this regularly  today.  Father with history of ulcerative colitis-patient had a reassuring colonoscopy in 2013 A/P: Stable-continue current medications  Hypertension S: controlled on lisinopril 40 mg-though high normal.  Weight up 1 pound since last visit-and 6 pounds over a year BP Readings from Last 3 Encounters:  09/26/18 138/80  02/21/18 118/78  08/23/17 118/78  A/P: We discussed blood pressure goal of <140/90. Continue current meds: But we discussed working on weight loss. 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 Portion control- stress eating--> these are topics he wants to work on    Future Appointments  Date Time Provider [place]  03/15/2019 10:40 AM Marin Olp, MD [facility]   Return in about 5 months (around 02/25/2019) for physical- come fasting if you dont mind.  Garrett Reddish, MD

## 2018-09-26 NOTE — Assessment & Plan Note (Addendum)
S: Depression is well controlled on Wellbutrin 150 mg extended release and Lexapro 20 mg.  PHQ 9 of 5 today.  No SI. A/P: Stable-continue current medications  Patient's social interaction is limited.  He is a very caring man-spends a lot of time helping his parents.  I encouraged him to try to make some time for himself to develop local friendships.  He seems at least partially open to this idea

## 2018-09-26 NOTE — Patient Instructions (Addendum)
Lets focus on staying weight neutral over holidays and then after holidays trying to get down at least 2-3 lbs. Try to go ahead and enact 150 minutes exercise per week and work on the portion control as you mentioned.

## 2018-09-26 NOTE — Assessment & Plan Note (Signed)
S:  controlled on pravastatin 40 mg.  He takes colestipol but this is for IBS Lab Results  Component Value Date   CHOL 147 02/21/2018   HDL 40.70 02/21/2018   LDLCALC 85 10/26/2016   LDLDIRECT 83.0 02/21/2018   TRIG 220.0 (H) 02/21/2018   CHOLHDL 4 02/21/2018   A/P: Stable with LDL under 100-continue current medications

## 2018-10-04 DIAGNOSIS — J301 Allergic rhinitis due to pollen: Secondary | ICD-10-CM | POA: Diagnosis not present

## 2018-10-04 DIAGNOSIS — J3081 Allergic rhinitis due to animal (cat) (dog) hair and dander: Secondary | ICD-10-CM | POA: Diagnosis not present

## 2018-10-05 DIAGNOSIS — J3089 Other allergic rhinitis: Secondary | ICD-10-CM | POA: Diagnosis not present

## 2018-10-12 DIAGNOSIS — J301 Allergic rhinitis due to pollen: Secondary | ICD-10-CM | POA: Diagnosis not present

## 2018-10-12 DIAGNOSIS — J3089 Other allergic rhinitis: Secondary | ICD-10-CM | POA: Diagnosis not present

## 2018-10-12 DIAGNOSIS — J3081 Allergic rhinitis due to animal (cat) (dog) hair and dander: Secondary | ICD-10-CM | POA: Diagnosis not present

## 2018-10-23 ENCOUNTER — Other Ambulatory Visit: Payer: Self-pay | Admitting: Family Medicine

## 2018-10-23 DIAGNOSIS — F321 Major depressive disorder, single episode, moderate: Secondary | ICD-10-CM

## 2018-10-24 DIAGNOSIS — J3081 Allergic rhinitis due to animal (cat) (dog) hair and dander: Secondary | ICD-10-CM | POA: Diagnosis not present

## 2018-10-24 DIAGNOSIS — J3089 Other allergic rhinitis: Secondary | ICD-10-CM | POA: Diagnosis not present

## 2018-10-24 DIAGNOSIS — J301 Allergic rhinitis due to pollen: Secondary | ICD-10-CM | POA: Diagnosis not present

## 2018-10-30 ENCOUNTER — Other Ambulatory Visit: Payer: Self-pay | Admitting: Family Medicine

## 2018-11-09 DIAGNOSIS — J3089 Other allergic rhinitis: Secondary | ICD-10-CM | POA: Diagnosis not present

## 2018-11-09 DIAGNOSIS — J301 Allergic rhinitis due to pollen: Secondary | ICD-10-CM | POA: Diagnosis not present

## 2018-11-09 DIAGNOSIS — J3081 Allergic rhinitis due to animal (cat) (dog) hair and dander: Secondary | ICD-10-CM | POA: Diagnosis not present

## 2018-11-20 DIAGNOSIS — J3081 Allergic rhinitis due to animal (cat) (dog) hair and dander: Secondary | ICD-10-CM | POA: Diagnosis not present

## 2018-11-20 DIAGNOSIS — J3089 Other allergic rhinitis: Secondary | ICD-10-CM | POA: Diagnosis not present

## 2018-11-20 DIAGNOSIS — J301 Allergic rhinitis due to pollen: Secondary | ICD-10-CM | POA: Diagnosis not present

## 2018-11-27 ENCOUNTER — Other Ambulatory Visit: Payer: Self-pay | Admitting: Family Medicine

## 2018-12-04 ENCOUNTER — Other Ambulatory Visit: Payer: Self-pay | Admitting: Family Medicine

## 2018-12-06 DIAGNOSIS — J3081 Allergic rhinitis due to animal (cat) (dog) hair and dander: Secondary | ICD-10-CM | POA: Diagnosis not present

## 2018-12-06 DIAGNOSIS — J3089 Other allergic rhinitis: Secondary | ICD-10-CM | POA: Diagnosis not present

## 2018-12-06 DIAGNOSIS — J301 Allergic rhinitis due to pollen: Secondary | ICD-10-CM | POA: Diagnosis not present

## 2018-12-18 DIAGNOSIS — J3089 Other allergic rhinitis: Secondary | ICD-10-CM | POA: Diagnosis not present

## 2018-12-18 DIAGNOSIS — J3081 Allergic rhinitis due to animal (cat) (dog) hair and dander: Secondary | ICD-10-CM | POA: Diagnosis not present

## 2018-12-18 DIAGNOSIS — J301 Allergic rhinitis due to pollen: Secondary | ICD-10-CM | POA: Diagnosis not present

## 2019-01-01 DIAGNOSIS — J301 Allergic rhinitis due to pollen: Secondary | ICD-10-CM | POA: Diagnosis not present

## 2019-01-01 DIAGNOSIS — J3089 Other allergic rhinitis: Secondary | ICD-10-CM | POA: Diagnosis not present

## 2019-01-01 DIAGNOSIS — J3081 Allergic rhinitis due to animal (cat) (dog) hair and dander: Secondary | ICD-10-CM | POA: Diagnosis not present

## 2019-01-15 DIAGNOSIS — J301 Allergic rhinitis due to pollen: Secondary | ICD-10-CM | POA: Diagnosis not present

## 2019-01-15 DIAGNOSIS — J3089 Other allergic rhinitis: Secondary | ICD-10-CM | POA: Diagnosis not present

## 2019-01-15 DIAGNOSIS — J3081 Allergic rhinitis due to animal (cat) (dog) hair and dander: Secondary | ICD-10-CM | POA: Diagnosis not present

## 2019-01-22 ENCOUNTER — Other Ambulatory Visit: Payer: Self-pay | Admitting: Family Medicine

## 2019-01-29 DIAGNOSIS — J3081 Allergic rhinitis due to animal (cat) (dog) hair and dander: Secondary | ICD-10-CM | POA: Diagnosis not present

## 2019-01-29 DIAGNOSIS — J301 Allergic rhinitis due to pollen: Secondary | ICD-10-CM | POA: Diagnosis not present

## 2019-01-29 DIAGNOSIS — J3089 Other allergic rhinitis: Secondary | ICD-10-CM | POA: Diagnosis not present

## 2019-02-01 DIAGNOSIS — J301 Allergic rhinitis due to pollen: Secondary | ICD-10-CM | POA: Diagnosis not present

## 2019-02-01 DIAGNOSIS — J3081 Allergic rhinitis due to animal (cat) (dog) hair and dander: Secondary | ICD-10-CM | POA: Diagnosis not present

## 2019-02-01 DIAGNOSIS — J3089 Other allergic rhinitis: Secondary | ICD-10-CM | POA: Diagnosis not present

## 2019-02-05 DIAGNOSIS — J301 Allergic rhinitis due to pollen: Secondary | ICD-10-CM | POA: Diagnosis not present

## 2019-02-05 DIAGNOSIS — J3089 Other allergic rhinitis: Secondary | ICD-10-CM | POA: Diagnosis not present

## 2019-02-08 DIAGNOSIS — J301 Allergic rhinitis due to pollen: Secondary | ICD-10-CM | POA: Diagnosis not present

## 2019-02-08 DIAGNOSIS — J3081 Allergic rhinitis due to animal (cat) (dog) hair and dander: Secondary | ICD-10-CM | POA: Diagnosis not present

## 2019-02-08 DIAGNOSIS — J3089 Other allergic rhinitis: Secondary | ICD-10-CM | POA: Diagnosis not present

## 2019-02-19 DIAGNOSIS — J3089 Other allergic rhinitis: Secondary | ICD-10-CM | POA: Diagnosis not present

## 2019-02-19 DIAGNOSIS — J301 Allergic rhinitis due to pollen: Secondary | ICD-10-CM | POA: Diagnosis not present

## 2019-02-19 DIAGNOSIS — J3081 Allergic rhinitis due to animal (cat) (dog) hair and dander: Secondary | ICD-10-CM | POA: Diagnosis not present

## 2019-02-21 ENCOUNTER — Other Ambulatory Visit: Payer: Self-pay | Admitting: Family Medicine

## 2019-03-06 DIAGNOSIS — J3081 Allergic rhinitis due to animal (cat) (dog) hair and dander: Secondary | ICD-10-CM | POA: Diagnosis not present

## 2019-03-06 DIAGNOSIS — J301 Allergic rhinitis due to pollen: Secondary | ICD-10-CM | POA: Diagnosis not present

## 2019-03-06 DIAGNOSIS — J3089 Other allergic rhinitis: Secondary | ICD-10-CM | POA: Diagnosis not present

## 2019-03-15 ENCOUNTER — Encounter: Payer: BLUE CROSS/BLUE SHIELD | Admitting: Family Medicine

## 2019-03-19 DIAGNOSIS — J3081 Allergic rhinitis due to animal (cat) (dog) hair and dander: Secondary | ICD-10-CM | POA: Diagnosis not present

## 2019-03-19 DIAGNOSIS — J3089 Other allergic rhinitis: Secondary | ICD-10-CM | POA: Diagnosis not present

## 2019-03-19 DIAGNOSIS — J301 Allergic rhinitis due to pollen: Secondary | ICD-10-CM | POA: Diagnosis not present

## 2019-04-03 DIAGNOSIS — J3081 Allergic rhinitis due to animal (cat) (dog) hair and dander: Secondary | ICD-10-CM | POA: Diagnosis not present

## 2019-04-03 DIAGNOSIS — J3089 Other allergic rhinitis: Secondary | ICD-10-CM | POA: Diagnosis not present

## 2019-04-03 DIAGNOSIS — J301 Allergic rhinitis due to pollen: Secondary | ICD-10-CM | POA: Diagnosis not present

## 2019-04-23 ENCOUNTER — Other Ambulatory Visit: Payer: Self-pay | Admitting: Family Medicine

## 2019-04-23 DIAGNOSIS — F321 Major depressive disorder, single episode, moderate: Secondary | ICD-10-CM

## 2019-04-23 DIAGNOSIS — J301 Allergic rhinitis due to pollen: Secondary | ICD-10-CM | POA: Diagnosis not present

## 2019-04-23 DIAGNOSIS — J3081 Allergic rhinitis due to animal (cat) (dog) hair and dander: Secondary | ICD-10-CM | POA: Diagnosis not present

## 2019-04-23 DIAGNOSIS — J3089 Other allergic rhinitis: Secondary | ICD-10-CM | POA: Diagnosis not present

## 2019-04-23 NOTE — Telephone Encounter (Signed)
Left message to return phone call.

## 2019-04-23 NOTE — Telephone Encounter (Signed)
Patient need to schedule an ov for more refills. CPE

## 2019-04-26 NOTE — Telephone Encounter (Signed)
Pt has been set up for VV tomorrow

## 2019-04-26 NOTE — Telephone Encounter (Signed)
CPE will be scheduled later due to not having space for CPE tomorrow.

## 2019-04-27 ENCOUNTER — Ambulatory Visit (INDEPENDENT_AMBULATORY_CARE_PROVIDER_SITE_OTHER): Payer: BC Managed Care – PPO | Admitting: Family Medicine

## 2019-04-27 ENCOUNTER — Encounter: Payer: Self-pay | Admitting: Family Medicine

## 2019-04-27 VITALS — BP 123/65 | HR 100 | Ht 67.5 in | Wt 188.8 lb

## 2019-04-27 DIAGNOSIS — F3342 Major depressive disorder, recurrent, in full remission: Secondary | ICD-10-CM

## 2019-04-27 DIAGNOSIS — E785 Hyperlipidemia, unspecified: Secondary | ICD-10-CM | POA: Diagnosis not present

## 2019-04-27 DIAGNOSIS — K580 Irritable bowel syndrome with diarrhea: Secondary | ICD-10-CM | POA: Diagnosis not present

## 2019-04-27 DIAGNOSIS — I10 Essential (primary) hypertension: Secondary | ICD-10-CM

## 2019-04-27 DIAGNOSIS — E663 Overweight: Secondary | ICD-10-CM

## 2019-04-27 MED ORDER — BUPROPION HCL ER (XL) 150 MG PO TB24: 150.0000 mg | ORAL_TABLET | Freq: Every day | ORAL | 3 refills | Status: DC

## 2019-04-27 MED ORDER — OMEPRAZOLE 20 MG PO CPDR: DELAYED_RELEASE_CAPSULE | ORAL | 1 refills | Status: DC

## 2019-04-27 MED ORDER — COLESTIPOL HCL 1 G PO TABS: 2.0000 g | ORAL_TABLET | Freq: Two times a day (BID) | ORAL | 3 refills | Status: DC

## 2019-04-27 MED ORDER — PRAVASTATIN SODIUM 40 MG PO TABS: 40.0000 mg | ORAL_TABLET | Freq: Every day | ORAL | 3 refills | Status: DC

## 2019-04-27 MED ORDER — ESCITALOPRAM OXALATE 20 MG PO TABS: 20.0000 mg | ORAL_TABLET | Freq: Every day | ORAL | 3 refills | Status: DC

## 2019-04-27 MED ORDER — LISINOPRIL 40 MG PO TABS: 40.0000 mg | ORAL_TABLET | Freq: Every day | ORAL | 3 refills | Status: DC

## 2019-04-27 NOTE — Progress Notes (Signed)
Phone [phone]   Subjective:  Virtual visit via Video note. Chief complaint: Chief Complaint  Patient presents with  . Hypertension    This visit type was conducted due to national recommendations for restrictions regarding the COVID-19 Pandemic (e.g. social distancing).  This format is felt to be most appropriate for this patient at this time balancing risks to patient and risks to population by having him in for in person visit.  No physical exam was performed (except for noted visual exam or audio findings with Telehealth visits).    Our team/I connected with Shea Stakes at  2:00 PM EDT by a video enabled telemedicine application ([email or web address] or caregility through epic)  -Interactive audio and video telecommunications were attempted between this provider and patient, however failed, due to patient having technical difficulties OR patient did not have access to video capability.  We continued and completed visit with audio only. I verified that I am speaking with the correct person using two identifiers.  Location patient: [place] Location provider: [facility], office Persons participating in the virtual visit:  patient  Our team/I discussed the limitations of evaluation and management by telemedicine and the availability of in person appointments. In light of current covid-19 pandemic, patient also understands that we are trying to protect them by minimizing in office contact if at all possible.  The patient expressed consent for telemedicine visit and agreed to proceed. Patient understands insurance will be billed.   ROS- No fever, chills, cough, shortness of breath, body aches, sore throat, or loss of taste or smell. Some sinus congestion related to allergies   Past Medical History-  Patient Active Problem List   Diagnosis Date Noted  . Hypertension 02/19/2014    Priority: Medium  . IBS (irritable bowel syndrome) diarrhea predominant 08/14/2012    Priority: Medium  .  Allergic rhinitis 02/21/2012    Priority: Medium  . Hyperlipidemia 09/05/2007    Priority: Medium  . Depression 05/25/2007    Priority: Medium  . GERD (gastroesophageal reflux disease) 11/29/2012    Priority: Low    Medications- reviewed and updated Current Outpatient Medications  Medication Sig Dispense Refill  . Alpha-D-Galactosidase (BEANO PO) Take by mouth as needed.    Marland Kitchen azelastine (ASTELIN) 137 MCG/SPRAY nasal spray 1 spray by Nasal route 2 (two) times daily. Use in each nostril as directed     . budesonide (RHINOCORT AQUA) 32 MCG/ACT nasal spray 1 spray by Nasal route daily.      Marland Kitchen buPROPion (WELLBUTRIN XL) 150 MG 24 hr tablet Take 1 tablet (150 mg total) by mouth daily. 90 tablet 3  . Calcium Carbonate Antacid (TUMS PO) Take by mouth as needed.    . colestipol (COLESTID) 1 g tablet Take 2 tablets (2 g total) by mouth 2 (two) times daily. 360 tablet 3  . desloratadine (CLARINEX) 5 MG tablet Take 5 mg by mouth daily.      Marland Kitchen EPINEPHrine 0.3 mg/0.3 mL IJ SOAJ injection Inject 0.3 mg into the muscle as needed.  0  . escitalopram (LEXAPRO) 20 MG tablet Take 1 tablet (20 mg total) by mouth daily. 90 tablet 3  . Flavoring Agent (PEPPERMINT FLAVOR) OIL Take 1 capsule by mouth daily.    . hyoscyamine (LEVSIN SL) 0.125 MG SL tablet Place 1 tablet (0.125 mg total) under the tongue every 4 (four) hours as needed. 60 tablet 5  . lisinopril (ZESTRIL) 40 MG tablet Take 1 tablet (40 mg total) by mouth daily. 90 tablet 3  .  Loperamide HCl (IMODIUM A-D PO) Take by mouth as needed.    . NON FORMULARY Allergy shots weekly    . Omega-3 Fatty Acids (FISH OIL) 1200 MG CAPS Take 1 capsule by mouth daily.    Marland Kitchen omeprazole (PRILOSEC) 20 MG capsule TAKE 1 CAPSULE BY MOUTH ONCE DAILY 60 MINUTES BEFORE BREAKFAST 90 capsule 1  . pravastatin (PRAVACHOL) 40 MG tablet Take 1 tablet (40 mg total) by mouth daily. 90 tablet 3   No current facility-administered medications for this visit.      Objective:  BP  123/65   Pulse 100   Ht 5' 7.5" (1.715 m)   Wt 188 lb 12.8 oz (85.6 kg)   BMI 29.13 kg/m  self reported vitals Gen: NAD, no audible wheezing, normal voice     Assessment and Plan   # Depression S: He feels things are stable. PHQ9 of 1 today. Compliant with lexapro 20mg  and wellbutrin 150mg  XR.  Depression screen [facility] 2/9 04/27/2019  Decreased Interest 0  Down, Depressed, Hopeless 0  PHQ - 2 Score 0  Altered sleeping 1  Tired, decreased energy 0  Change in appetite 0  Feeling bad or failure about yourself  0  Trouble concentrating 0  Moving slowly or fidgety/restless 0  Suicidal thoughts 0  PHQ-9 Score 1  Difficult doing work/chores Not difficult at all  A/P: Stable. Continue current medications.  Remains in full remission  #hypertension/overweight S: controlled on lisinopril 40mg . Weight down perhaps 5 lbs if he accounts for having fewer clothes on- minimal walking now- wants to restart this. He has had some success with cutting down on portion control- feels like hes doing ok.  Wt Readings from Last 3 Encounters:  04/27/19 188 lb 12.8 oz (85.6 kg)  09/26/18 198 lb (89.8 kg)  02/21/18 197 lb (89.4 kg)  A/P: hypertension- Stable. Continue current medications.  For weight- Encouraged need for healthy eating, regular exercise, weight loss.   # IBS S:Feels liek IBS is reasonably stable on colestipol. occasoinal half imodium seems to work if needed. No recent hyoscyamine - cant even remember last time he used it. Colonoscopy was 2013 (dad with history of ulcerative colitis) . Uses peppermint oil and seems helpful.  A/P: Stable. Continue current medications.    #hyperlipidemia S: previously controlled on pravastatin 40mg  Lab Results  Component Value Date   CHOL 147 02/21/2018   HDL 40.70 02/21/2018   LDLCALC 85 10/26/2016   LDLDIRECT 83.0 02/21/2018   TRIG 220.0 (H) 02/21/2018   CHOLHDL 4 02/21/2018   A/P: needs updated lipid panel. See below about plans to update. For now  continue pravastatin  Is due for labs but we opted to wait until 3-6 month in person physicals. He will call to schedule this Lab/Order associations: Moderate single current episode of major depressive disorder ([place]) - Plan: escitalopram (LEXAPRO) 20 MG tablet  Meds ordered this encounter  Medications  . buPROPion (WELLBUTRIN XL) 150 MG 24 hr tablet    Sig: Take 1 tablet (150 mg total) by mouth daily.    Dispense:  90 tablet    Refill:  3  . escitalopram (LEXAPRO) 20 MG tablet    Sig: Take 1 tablet (20 mg total) by mouth daily.    Dispense:  90 tablet    Refill:  3  . lisinopril (ZESTRIL) 40 MG tablet    Sig: Take 1 tablet (40 mg total) by mouth daily.    Dispense:  90 tablet    Refill:  3  . pravastatin (PRAVACHOL) 40 MG tablet    Sig: Take 1 tablet (40 mg total) by mouth daily.    Dispense:  90 tablet    Refill:  3  . omeprazole (PRILOSEC) 20 MG capsule    Sig: TAKE 1 CAPSULE BY MOUTH ONCE DAILY 60 MINUTES BEFORE BREAKFAST    Dispense:  90 capsule    Refill:  1  . colestipol (COLESTID) 1 g tablet    Sig: Take 2 tablets (2 g total) by mouth 2 (two) times daily.    Dispense:  360 tablet    Refill:  3   Return precautions advised.  [name], MD

## 2019-04-27 NOTE — Patient Instructions (Addendum)
There are no preventive care reminders to display for this patient.  Video visit- failed so did phone only

## 2019-05-09 DIAGNOSIS — J3081 Allergic rhinitis due to animal (cat) (dog) hair and dander: Secondary | ICD-10-CM | POA: Diagnosis not present

## 2019-05-09 DIAGNOSIS — J301 Allergic rhinitis due to pollen: Secondary | ICD-10-CM | POA: Diagnosis not present

## 2019-05-09 DIAGNOSIS — J3089 Other allergic rhinitis: Secondary | ICD-10-CM | POA: Diagnosis not present

## 2019-05-21 DIAGNOSIS — J3089 Other allergic rhinitis: Secondary | ICD-10-CM | POA: Diagnosis not present

## 2019-05-21 DIAGNOSIS — J301 Allergic rhinitis due to pollen: Secondary | ICD-10-CM | POA: Diagnosis not present

## 2019-05-21 DIAGNOSIS — J3081 Allergic rhinitis due to animal (cat) (dog) hair and dander: Secondary | ICD-10-CM | POA: Diagnosis not present

## 2019-06-05 DIAGNOSIS — J3089 Other allergic rhinitis: Secondary | ICD-10-CM | POA: Diagnosis not present

## 2019-06-05 DIAGNOSIS — J301 Allergic rhinitis due to pollen: Secondary | ICD-10-CM | POA: Diagnosis not present

## 2019-06-14 DIAGNOSIS — D2272 Melanocytic nevi of left lower limb, including hip: Secondary | ICD-10-CM | POA: Diagnosis not present

## 2019-06-14 DIAGNOSIS — D225 Melanocytic nevi of trunk: Secondary | ICD-10-CM | POA: Diagnosis not present

## 2019-06-14 DIAGNOSIS — D1801 Hemangioma of skin and subcutaneous tissue: Secondary | ICD-10-CM | POA: Diagnosis not present

## 2019-06-14 DIAGNOSIS — L570 Actinic keratosis: Secondary | ICD-10-CM | POA: Diagnosis not present

## 2019-06-14 DIAGNOSIS — L821 Other seborrheic keratosis: Secondary | ICD-10-CM | POA: Diagnosis not present

## 2019-06-18 DIAGNOSIS — J3089 Other allergic rhinitis: Secondary | ICD-10-CM | POA: Diagnosis not present

## 2019-06-18 DIAGNOSIS — J301 Allergic rhinitis due to pollen: Secondary | ICD-10-CM | POA: Diagnosis not present

## 2019-06-18 DIAGNOSIS — J3081 Allergic rhinitis due to animal (cat) (dog) hair and dander: Secondary | ICD-10-CM | POA: Diagnosis not present

## 2019-07-02 DIAGNOSIS — J301 Allergic rhinitis due to pollen: Secondary | ICD-10-CM | POA: Diagnosis not present

## 2019-07-02 DIAGNOSIS — J3089 Other allergic rhinitis: Secondary | ICD-10-CM | POA: Diagnosis not present

## 2019-07-02 DIAGNOSIS — J3081 Allergic rhinitis due to animal (cat) (dog) hair and dander: Secondary | ICD-10-CM | POA: Diagnosis not present

## 2019-07-18 DIAGNOSIS — J3089 Other allergic rhinitis: Secondary | ICD-10-CM | POA: Diagnosis not present

## 2019-07-18 DIAGNOSIS — J3081 Allergic rhinitis due to animal (cat) (dog) hair and dander: Secondary | ICD-10-CM | POA: Diagnosis not present

## 2019-07-18 DIAGNOSIS — J301 Allergic rhinitis due to pollen: Secondary | ICD-10-CM | POA: Diagnosis not present

## 2019-07-27 NOTE — Progress Notes (Addendum)
Phone: [phone]    Subjective:  Patient presents today for their annual physical. Chief complaint-noted.  Chief Complaint  Patient presents with  . Annual Exam    See problem oriented charting- ROS- full  review of systems was completed and negative including No chest pain or shortness of breath. No headache or blurry vision.   The following were reviewed and entered/updated in epic: Past Medical History:  Diagnosis Date  . Acne   . Allergy   . Anxiety   . Depression   . External hemorrhoids    2004  . GERD (gastroesophageal reflux disease)   . HLD (hyperlipidemia)   . Irritable bowel syndrome   . Strabismus    Patient Active Problem List   Diagnosis Date Noted  . Hypertension 02/19/2014    Priority: Medium  . IBS (irritable bowel syndrome) diarrhea predominant 08/14/2012    Priority: Medium  . Allergic rhinitis 02/21/2012    Priority: Medium  . Hyperlipidemia 09/05/2007    Priority: Medium  . Depression 05/25/2007    Priority: Medium  . GERD (gastroesophageal reflux disease) 11/29/2012    Priority: Low   Past Surgical History:  Procedure Laterality Date  . COLONOSCOPY  10/16/12  . FLEXIBLE SIGMOIDOSCOPY     hemorrhoids-rule out internal bleeding  . STRABISMUS SURGERY  2001, 2010   X 2  . WISDOM TOOTH EXTRACTION      Family History  Problem Relation Age of Onset  . Breast cancer Mother   . Hypertension Mother   . Hyperlipidemia Mother   . Prostate cancer Father        late 18s  . Ulcerative colitis Father   . Hypertension Father   . Hyperlipidemia Father   . Colon cancer Neg Hx   . Rectal cancer Neg Hx   . Stomach cancer Neg Hx   . Esophageal cancer Neg Hx     Medications- reviewed and updated Current Outpatient Medications  Medication Sig Dispense Refill  . Alpha-D-Galactosidase (BEANO PO) Take by mouth as needed.    Marland Kitchen azelastine (ASTELIN) 137 MCG/SPRAY nasal spray 1 spray by Nasal route 2 (two) times daily. Use in each nostril as  directed     . budesonide (RHINOCORT AQUA) 32 MCG/ACT nasal spray 1 spray by Nasal route daily.      Marland Kitchen buPROPion (WELLBUTRIN XL) 150 MG 24 hr tablet Take 1 tablet (150 mg total) by mouth daily. 90 tablet 3  . Calcium Carbonate Antacid (TUMS PO) Take by mouth as needed.    . colestipol (COLESTID) 1 g tablet Take 2 tablets (2 g total) by mouth 2 (two) times daily. 360 tablet 3  . desloratadine (CLARINEX) 5 MG tablet Take 5 mg by mouth daily.      Marland Kitchen EPINEPHrine 0.3 mg/0.3 mL IJ SOAJ injection Inject 0.3 mg into the muscle as needed.  0  . escitalopram (LEXAPRO) 20 MG tablet Take 1 tablet (20 mg total) by mouth daily. 90 tablet 3  . Flavoring Agent (PEPPERMINT FLAVOR) OIL Take 1 capsule by mouth daily.    . hyoscyamine (LEVSIN SL) 0.125 MG SL tablet Place 1 tablet (0.125 mg total) under the tongue every 4 (four) hours as needed. 60 tablet 5  . lisinopril (ZESTRIL) 40 MG tablet Take 1 tablet (40 mg total) by mouth daily. 90 tablet 3  . Loperamide HCl (IMODIUM A-D PO) Take by mouth as needed.    . NON FORMULARY Allergy shots weekly    . Omega-3 Fatty Acids (FISH OIL) 1200  MG CAPS Take 1 capsule by mouth daily.    Marland Kitchen omeprazole (PRILOSEC) 20 MG capsule TAKE 1 CAPSULE BY MOUTH ONCE DAILY 60 MINUTES BEFORE BREAKFAST 90 capsule 1  . pravastatin (PRAVACHOL) 40 MG tablet Take 1 tablet (40 mg total) by mouth daily. 90 tablet 3   No current facility-administered medications for this visit.     Allergies-reviewed and updated No Known Allergies  Social History   Social History Narrative   Single. Live with parents.       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 Oropharynx normal Neck: no thyromegaly  CV: RRR no murmurs rubs or gallops Lungs: CTAB no crackles, wheeze, rhonchi Abdomen: soft/nontender/nondistended/normal bowel sounds. No rebound or guarding.  Ext: no edema Skin: warm, dry Neuro: grossly normal, moves all extremities, PERRLA    Assessment and Plan:  43 y.o. male presenting for annual physical.  Health Maintenance counseling: 1. Anticipatory guidance: Patient counseled regarding regular dental exams -q6 months, eye exams - q2 years,  avoiding smoking and second hand smoke , limiting alcohol to 2 beverages per day- well under this.   2. Risk factor reduction:  Advised patient of need for regular exercise and diet rich and fruits and vegetables to reduce risk of heart attack and stroke. Exercise- not exercising- recommended 150 mins a week. Diet-feels like weight going down as not eating out as often- trying to eat healthier at home.  Down 2 lbs from last in person . Set a goal of 5 lbs off by follow up.  Wt Readings from Last 3 Encounters:  07/31/19 196 lb (88.9 kg)  04/27/19 188 lb 12.8 oz (85.6 kg)  09/26/18 198 lb (89.8 kg)  3. Immunizations/screenings/ancillary studies-flu shot today  Immunization History  Administered Date(s) Administered  . Influenza Split 09/11/2012  . Influenza Whole 08/22/2005, 09/04/2007, 08/03/2010  . Influenza,inj,Quad PF,6+ Mos 08/09/2013, 09/08/2016, 07/19/2017, 07/31/2018, 07/31/2019  . Influenza-Unspecified 09/05/2014, 08/21/2015  . Td 01/29/2010  . Tdap 03/12/2008   4. Prostate cancer screening- Father with prostate cancer in 27s.  We will start screening patient at age 68.   23. Colon cancer screening -  No family history, start at age 89 . Had sigmoidoscopy due to IBS and dad's symptoms.  6. Skin cancer screening/prevention-follows with Dr. Elvera Lennox yearly- had one benign spot removed on head. advised regular sunscreen use. Denies worrisome, changing, or new skin lesions.  7. Testicular cancer screening- advised monthly self exams  8.  STD screening- patient opts out-not sexually active 9.  Never smoker-   Status of chronic or acute concerns  Hypertension - Taking Lisinopril 40 mg daily.    Hyperlipidemia- Taking Pravastatin 40 mg dailyAlso taking Omega 3 supplement. Update lipids today  Depression - Taking Escitalopram 20 mg daily and Bupropion XL 150 mg daily.   GERD - Taking Omeprazole 20 mg daily.  Reasonable control-discussed could try Pepcid/ from avs ""Could trial pepcid over the counter- only issue is that its a twice a day medicine. "  Allergic Rhinitis - Astelin nasal spray prn, Rhinocort prn, Clarinex daily, yearround issues. Best months are December  or January. Allergy shots every other week  IBS - Taking Hyoscyamine 0.125 mg prn-very rarely-and Imodium prn.  Diarrhea predominant.  Recommended follow up: 32-month follow-up recommended  Lab/Order associations: not fasting    ICD-10-CM   1. Preventative health care  Z00.00 CBC    Comprehensive metabolic panel    Lipid panel    Vitamin B12  2. Essential hypertension  I10 CBC    Comprehensive metabolic panel    Lipid panel  3. Hyperlipidemia, unspecified hyperlipidemia type  E78.5 CBC    Comprehensive metabolic panel    Lipid panel  4. Irritable bowel syndrome with diarrhea  K58.0   5. Major depressive disorder with single episode, in full remission ([place])  F32.5   6. High risk medication use  Z79.899 Vitamin B12  7. Need for influenza vaccination  Z23 Flu Vaccine QUAD 6+ mos PF IM (Fluarix Quad PF)    Return precautions advised.   [name], MD

## 2019-07-27 NOTE — Patient Instructions (Addendum)
Health Maintenance Due  Topic Date Due  . INFLUENZA VACCINE Done today! 06/23/2019   Could trial pepcid over the counter- only issue is that its a twice a day medicine.   Please stop by lab before you go If you do not have mychart- we will call you about results within 5 business days of [place] receiving them.  If you have mychart- we will send your results within 3 business days of [place] receiving them.  If abnormal or we want to clarify a result, we will call or mychart you to make sure you receive the message.  If you have questions or concerns or don't hear within 5-7 days, please send [place] a message or call us.

## 2019-07-31 ENCOUNTER — Other Ambulatory Visit: Payer: Self-pay

## 2019-07-31 ENCOUNTER — Encounter: Payer: Self-pay | Admitting: Family Medicine

## 2019-07-31 ENCOUNTER — Ambulatory Visit (INDEPENDENT_AMBULATORY_CARE_PROVIDER_SITE_OTHER): Payer: BC Managed Care – PPO | Admitting: Family Medicine

## 2019-07-31 VITALS — BP 100/68 | HR 80 | Temp 97.3°F | Ht 67.25 in | Wt 196.0 lb

## 2019-07-31 DIAGNOSIS — K580 Irritable bowel syndrome with diarrhea: Secondary | ICD-10-CM | POA: Diagnosis not present

## 2019-07-31 DIAGNOSIS — Z0000 Encounter for general adult medical examination without abnormal findings: Secondary | ICD-10-CM | POA: Diagnosis not present

## 2019-07-31 DIAGNOSIS — Z79899 Other long term (current) drug therapy: Secondary | ICD-10-CM

## 2019-07-31 DIAGNOSIS — F325 Major depressive disorder, single episode, in full remission: Secondary | ICD-10-CM

## 2019-07-31 DIAGNOSIS — I10 Essential (primary) hypertension: Secondary | ICD-10-CM

## 2019-07-31 DIAGNOSIS — Z23 Encounter for immunization: Secondary | ICD-10-CM

## 2019-07-31 DIAGNOSIS — E785 Hyperlipidemia, unspecified: Secondary | ICD-10-CM | POA: Diagnosis not present

## 2019-07-31 LAB — LDL CHOLESTEROL, DIRECT: Direct LDL: 87 mg/dL

## 2019-07-31 LAB — LIPID PANEL
Cholesterol: 149 mg/dL (ref 0–200)
HDL: 41.6 mg/dL (ref >39.00)
NonHDL: 107.27
Total CHOL/HDL Ratio: 4
Triglycerides: 280 mg/dL — ABNORMAL HIGH (ref 0.0–149.0)
VLDL: 56 mg/dL — ABNORMAL HIGH (ref 0.0–40.0)

## 2019-07-31 LAB — CBC
HCT: 37.7 % — ABNORMAL LOW (ref 39.0–52.0)
Hemoglobin: 12.6 g/dL — ABNORMAL LOW (ref 13.0–17.0)
MCHC: 33.3 g/dL (ref 30.0–36.0)
MCV: 79.6 fl (ref 78.0–100.0)
Platelets: 264 10*3/uL (ref 150.0–400.0)
RBC: 4.74 Mil/uL (ref 4.22–5.81)
RDW: 13.8 % (ref 11.5–15.5)
WBC: 5.5 10*3/uL (ref 4.0–10.5)

## 2019-07-31 LAB — COMPREHENSIVE METABOLIC PANEL
ALT: 15 U/L (ref 0–53)
AST: 13 U/L (ref 0–37)
Albumin: 4.8 g/dL (ref 3.5–5.2)
Alkaline Phosphatase: 71 U/L (ref 39–117)
BUN: 9 mg/dL (ref 6–23)
CO2: 27 mEq/L (ref 19–32)
Calcium: 9.6 mg/dL (ref 8.4–10.5)
Chloride: 101 mEq/L (ref 96–112)
Creatinine, Ser: 0.82 mg/dL (ref 0.40–1.50)
GFR: 102.23 mL/min (ref >60.00)
Glucose, Bld: 74 mg/dL (ref 70–99)
Potassium: 4.5 mEq/L (ref 3.5–5.1)
Sodium: 137 mEq/L (ref 135–145)
Total Bilirubin: 0.6 mg/dL (ref 0.2–1.2)
Total Protein: 7.2 g/dL (ref 6.0–8.3)

## 2019-07-31 LAB — VITAMIN B12: Vitamin B-12: 151 pg/mL — ABNORMAL LOW (ref 211–911)

## 2019-08-01 NOTE — Addendum Note (Signed)
Addended by: Gwenyth Ober R on: 08/01/2019 10:04 AM   Modules accepted: Orders

## 2019-08-03 DIAGNOSIS — J3081 Allergic rhinitis due to animal (cat) (dog) hair and dander: Secondary | ICD-10-CM | POA: Diagnosis not present

## 2019-08-03 DIAGNOSIS — J3089 Other allergic rhinitis: Secondary | ICD-10-CM | POA: Diagnosis not present

## 2019-08-03 DIAGNOSIS — J301 Allergic rhinitis due to pollen: Secondary | ICD-10-CM | POA: Diagnosis not present

## 2019-08-06 ENCOUNTER — Telehealth: Payer: Self-pay

## 2019-08-06 NOTE — Telephone Encounter (Signed)
Copied from [place] [phone]. Topic: General - Other >> Aug 06, 2019  1:56 PM Mcneil, Ja-Kwan wrote: Reason for CRM: Pt stated he received a message asking him to call the office to schedule B-12 injection. Pt requests call back. Cb# [phone]

## 2019-08-07 ENCOUNTER — Other Ambulatory Visit: Payer: Self-pay

## 2019-08-07 ENCOUNTER — Ambulatory Visit (INDEPENDENT_AMBULATORY_CARE_PROVIDER_SITE_OTHER): Payer: BC Managed Care – PPO

## 2019-08-07 DIAGNOSIS — E538 Deficiency of other specified B group vitamins: Secondary | ICD-10-CM | POA: Diagnosis not present

## 2019-08-07 MED ORDER — CYANOCOBALAMIN 1000 MCG/ML IJ SOLN
1000.0000 ug | Freq: Once | INTRAMUSCULAR | Status: AC
  Administered 2019-08-07: 15:00:00 1000 ug via INTRAMUSCULAR

## 2019-08-07 NOTE — Progress Notes (Signed)
Per orders of Dr. Yong Channel, injection of Vitamin b12, 1000 mcg given left deltoid IM by Kevan Ny, CMA Patient tolerated injection well and will return in 1 week for his next injection.

## 2019-08-14 ENCOUNTER — Other Ambulatory Visit: Payer: Self-pay

## 2019-08-14 ENCOUNTER — Ambulatory Visit (INDEPENDENT_AMBULATORY_CARE_PROVIDER_SITE_OTHER): Payer: BC Managed Care – PPO

## 2019-08-14 DIAGNOSIS — E538 Deficiency of other specified B group vitamins: Secondary | ICD-10-CM | POA: Diagnosis not present

## 2019-08-14 MED ORDER — CYANOCOBALAMIN 1000 MCG/ML IJ SOLN
1000.0000 ug | Freq: Once | INTRAMUSCULAR | Status: AC
  Administered 2019-08-14: 1000 ug via INTRAMUSCULAR

## 2019-08-14 NOTE — Progress Notes (Signed)
Per orders of Dr. Yong Channel, injection of b 12 injection given by Brendell L Tyus in right deltoid. Patient tolerated injection well. Patient will make appointment for 1 week.

## 2019-08-16 ENCOUNTER — Other Ambulatory Visit: Payer: Self-pay

## 2019-08-16 DIAGNOSIS — J3081 Allergic rhinitis due to animal (cat) (dog) hair and dander: Secondary | ICD-10-CM | POA: Diagnosis not present

## 2019-08-16 DIAGNOSIS — J301 Allergic rhinitis due to pollen: Secondary | ICD-10-CM | POA: Diagnosis not present

## 2019-08-16 DIAGNOSIS — J3089 Other allergic rhinitis: Secondary | ICD-10-CM | POA: Diagnosis not present

## 2019-08-16 DIAGNOSIS — D649 Anemia, unspecified: Secondary | ICD-10-CM

## 2019-08-16 NOTE — Progress Notes (Signed)
I have reviewed and agree with note, evaluation, plan.   Stephen Hunter, MD  

## 2019-08-21 ENCOUNTER — Ambulatory Visit: Payer: BC Managed Care – PPO

## 2019-08-22 ENCOUNTER — Other Ambulatory Visit: Payer: Self-pay

## 2019-08-22 ENCOUNTER — Ambulatory Visit (INDEPENDENT_AMBULATORY_CARE_PROVIDER_SITE_OTHER): Payer: BC Managed Care – PPO

## 2019-08-22 DIAGNOSIS — E538 Deficiency of other specified B group vitamins: Secondary | ICD-10-CM

## 2019-08-22 MED ORDER — CYANOCOBALAMIN 1000 MCG/ML IJ SOLN: 1000.0000 ug | Freq: Once | INTRAMUSCULAR | Status: DC

## 2019-08-22 NOTE — Patient Instructions (Signed)
There are no preventive care reminders to display for this patient.  Depression screen [facility] 2/9 07/31/2019 04/27/2019 09/26/2018  Decreased Interest 0 0 1  Down, Depressed, Hopeless 0 0 0  PHQ - 2 Score 0 0 1  Altered sleeping 1 1 1   Tired, decreased energy 2 0 1  Change in appetite 0 0 2  Feeling bad or failure about yourself  0 0 0  Trouble concentrating 0 0 0  Moving slowly or fidgety/restless 0 0 0  Suicidal thoughts 0 0 0  PHQ-9 Score 3 1 5   Difficult doing work/chores Not difficult at all Not difficult at all Not difficult at all

## 2019-08-22 NOTE — Progress Notes (Signed)
Per orders of Dr. Wallace, injection of B12 injection given by Zaineb Nowaczyk L Staphany Ditton in left deltoid. Patient tolerated injection well. Patient will make appointment for 1 week.   

## 2019-08-28 ENCOUNTER — Ambulatory Visit (INDEPENDENT_AMBULATORY_CARE_PROVIDER_SITE_OTHER): Payer: BC Managed Care – PPO

## 2019-08-28 ENCOUNTER — Other Ambulatory Visit: Payer: Self-pay

## 2019-08-28 DIAGNOSIS — E538 Deficiency of other specified B group vitamins: Secondary | ICD-10-CM | POA: Diagnosis not present

## 2019-08-28 MED ORDER — CYANOCOBALAMIN 1000 MCG/ML IJ SOLN
1000.0000 ug | Freq: Once | INTRAMUSCULAR | Status: AC
  Administered 2019-08-28: 1000 ug via INTRAMUSCULAR

## 2019-08-28 NOTE — Progress Notes (Signed)
Per orders of Dr. Yong Channel, injection of Vitamin b12, 1000 mcg given right deltoid IM by Kevan Ny, CMA  Patient tolerated injection well.  He will begin taking an OTC supplement of b12 1000 mcg on 10/13.

## 2019-09-03 DIAGNOSIS — J3089 Other allergic rhinitis: Secondary | ICD-10-CM | POA: Diagnosis not present

## 2019-09-03 DIAGNOSIS — J301 Allergic rhinitis due to pollen: Secondary | ICD-10-CM | POA: Diagnosis not present

## 2019-09-03 DIAGNOSIS — J3081 Allergic rhinitis due to animal (cat) (dog) hair and dander: Secondary | ICD-10-CM | POA: Diagnosis not present

## 2019-09-06 DIAGNOSIS — J3081 Allergic rhinitis due to animal (cat) (dog) hair and dander: Secondary | ICD-10-CM | POA: Diagnosis not present

## 2019-09-06 DIAGNOSIS — J301 Allergic rhinitis due to pollen: Secondary | ICD-10-CM | POA: Diagnosis not present

## 2019-09-07 DIAGNOSIS — J3089 Other allergic rhinitis: Secondary | ICD-10-CM | POA: Diagnosis not present

## 2019-09-18 DIAGNOSIS — J3081 Allergic rhinitis due to animal (cat) (dog) hair and dander: Secondary | ICD-10-CM | POA: Diagnosis not present

## 2019-09-18 DIAGNOSIS — J3089 Other allergic rhinitis: Secondary | ICD-10-CM | POA: Diagnosis not present

## 2019-09-18 DIAGNOSIS — J301 Allergic rhinitis due to pollen: Secondary | ICD-10-CM | POA: Diagnosis not present

## 2019-10-01 DIAGNOSIS — J301 Allergic rhinitis due to pollen: Secondary | ICD-10-CM | POA: Diagnosis not present

## 2019-10-01 DIAGNOSIS — J3081 Allergic rhinitis due to animal (cat) (dog) hair and dander: Secondary | ICD-10-CM | POA: Diagnosis not present

## 2019-10-01 DIAGNOSIS — J3089 Other allergic rhinitis: Secondary | ICD-10-CM | POA: Diagnosis not present

## 2019-10-15 DIAGNOSIS — J3081 Allergic rhinitis due to animal (cat) (dog) hair and dander: Secondary | ICD-10-CM | POA: Diagnosis not present

## 2019-10-15 DIAGNOSIS — J3089 Other allergic rhinitis: Secondary | ICD-10-CM | POA: Diagnosis not present

## 2019-10-15 DIAGNOSIS — J301 Allergic rhinitis due to pollen: Secondary | ICD-10-CM | POA: Diagnosis not present

## 2019-10-29 DIAGNOSIS — J3089 Other allergic rhinitis: Secondary | ICD-10-CM | POA: Diagnosis not present

## 2019-10-29 DIAGNOSIS — J3081 Allergic rhinitis due to animal (cat) (dog) hair and dander: Secondary | ICD-10-CM | POA: Diagnosis not present

## 2019-10-29 DIAGNOSIS — J301 Allergic rhinitis due to pollen: Secondary | ICD-10-CM | POA: Diagnosis not present

## 2019-11-02 DIAGNOSIS — J3081 Allergic rhinitis due to animal (cat) (dog) hair and dander: Secondary | ICD-10-CM | POA: Diagnosis not present

## 2019-11-02 DIAGNOSIS — J3089 Other allergic rhinitis: Secondary | ICD-10-CM | POA: Diagnosis not present

## 2019-11-02 DIAGNOSIS — J301 Allergic rhinitis due to pollen: Secondary | ICD-10-CM | POA: Diagnosis not present

## 2019-11-05 DIAGNOSIS — J301 Allergic rhinitis due to pollen: Secondary | ICD-10-CM | POA: Diagnosis not present

## 2019-11-05 DIAGNOSIS — J3081 Allergic rhinitis due to animal (cat) (dog) hair and dander: Secondary | ICD-10-CM | POA: Diagnosis not present

## 2019-11-05 DIAGNOSIS — J3089 Other allergic rhinitis: Secondary | ICD-10-CM | POA: Diagnosis not present

## 2019-11-09 DIAGNOSIS — J3089 Other allergic rhinitis: Secondary | ICD-10-CM | POA: Diagnosis not present

## 2019-11-09 DIAGNOSIS — J301 Allergic rhinitis due to pollen: Secondary | ICD-10-CM | POA: Diagnosis not present

## 2019-11-09 DIAGNOSIS — J3081 Allergic rhinitis due to animal (cat) (dog) hair and dander: Secondary | ICD-10-CM | POA: Diagnosis not present

## 2019-11-13 DIAGNOSIS — J301 Allergic rhinitis due to pollen: Secondary | ICD-10-CM | POA: Diagnosis not present

## 2019-11-13 DIAGNOSIS — J3081 Allergic rhinitis due to animal (cat) (dog) hair and dander: Secondary | ICD-10-CM | POA: Diagnosis not present

## 2019-11-13 DIAGNOSIS — J3089 Other allergic rhinitis: Secondary | ICD-10-CM | POA: Diagnosis not present

## 2019-11-23 HISTORY — PX: ESOPHAGOGASTRODUODENOSCOPY: SHX1529

## 2019-11-27 DIAGNOSIS — J3081 Allergic rhinitis due to animal (cat) (dog) hair and dander: Secondary | ICD-10-CM | POA: Diagnosis not present

## 2019-11-27 DIAGNOSIS — J301 Allergic rhinitis due to pollen: Secondary | ICD-10-CM | POA: Diagnosis not present

## 2019-11-27 DIAGNOSIS — J3089 Other allergic rhinitis: Secondary | ICD-10-CM | POA: Diagnosis not present

## 2019-12-03 ENCOUNTER — Other Ambulatory Visit: Payer: Self-pay | Admitting: Family Medicine

## 2019-12-06 ENCOUNTER — Ambulatory Visit: Payer: BC Managed Care – PPO | Attending: Internal Medicine

## 2019-12-06 DIAGNOSIS — Z23 Encounter for immunization: Secondary | ICD-10-CM | POA: Insufficient documentation

## 2019-12-06 NOTE — Progress Notes (Signed)
   [id number]  Name:  Garrett [name].    MRN: [id number] DOB: 1976-06-08  12/06/2019  [name] was observed post Covid-19 immunization for 15 minutes without incidence. He was provided with Vaccine Information Sheet and instruction to access the V-Safe system.   [name] was instructed to call 911 with any severe reactions post vaccine: Marland Kitchen Difficulty breathing  . Swelling of your face and throat  . A fast heartbeat  . A bad rash all over your body  . Dizziness and weakness    Immunizations Administered    Name Date Dose VIS Date Route   Pfizer COVID-19 Vaccine 12/06/2019 12:36 PM 0.3 mL 11/02/2019 Intramuscular   Manufacturer: [place]   Lot: [id number]   [place]: [id number]

## 2019-12-11 DIAGNOSIS — J3081 Allergic rhinitis due to animal (cat) (dog) hair and dander: Secondary | ICD-10-CM | POA: Diagnosis not present

## 2019-12-11 DIAGNOSIS — J301 Allergic rhinitis due to pollen: Secondary | ICD-10-CM | POA: Diagnosis not present

## 2019-12-11 DIAGNOSIS — J3089 Other allergic rhinitis: Secondary | ICD-10-CM | POA: Diagnosis not present

## 2019-12-25 ENCOUNTER — Ambulatory Visit: Payer: BC Managed Care – PPO | Attending: Internal Medicine

## 2019-12-25 ENCOUNTER — Ambulatory Visit: Payer: BC Managed Care – PPO

## 2019-12-25 DIAGNOSIS — Z23 Encounter for immunization: Secondary | ICD-10-CM | POA: Insufficient documentation

## 2019-12-25 NOTE — Progress Notes (Signed)
   [id number] [facility]  Name:  Garrett [name].    MRN: [id number] DOB: 1976/05/28  12/25/2019  [name] was observed post Covid-19 immunization for 15 minutes without incidence. He was provided with Vaccine Information Sheet and instruction to access the V-Safe system.   [name] was instructed to call 911 with any severe reactions post vaccine: Marland Kitchen Difficulty breathing  . Swelling of your face and throat  . A fast heartbeat  . A bad rash all over your body  . Dizziness and weakness    Immunizations Administered    Name Date Dose VIS Date Route   Pfizer COVID-19 Vaccine 12/25/2019 11:37 AM 0.3 mL 11/02/2019 Intramuscular   Manufacturer: [place]   Lot: [id number]   [place]: [id number]

## 2019-12-31 DIAGNOSIS — J301 Allergic rhinitis due to pollen: Secondary | ICD-10-CM | POA: Diagnosis not present

## 2019-12-31 DIAGNOSIS — J3081 Allergic rhinitis due to animal (cat) (dog) hair and dander: Secondary | ICD-10-CM | POA: Diagnosis not present

## 2019-12-31 DIAGNOSIS — J3089 Other allergic rhinitis: Secondary | ICD-10-CM | POA: Diagnosis not present

## 2020-01-15 DIAGNOSIS — J3089 Other allergic rhinitis: Secondary | ICD-10-CM | POA: Diagnosis not present

## 2020-01-15 DIAGNOSIS — J301 Allergic rhinitis due to pollen: Secondary | ICD-10-CM | POA: Diagnosis not present

## 2020-01-15 DIAGNOSIS — J3081 Allergic rhinitis due to animal (cat) (dog) hair and dander: Secondary | ICD-10-CM | POA: Diagnosis not present

## 2020-01-28 ENCOUNTER — Ambulatory Visit: Payer: BC Managed Care – PPO | Admitting: Family Medicine

## 2020-01-28 ENCOUNTER — Other Ambulatory Visit: Payer: Self-pay | Admitting: Internal Medicine

## 2020-01-28 DIAGNOSIS — K589 Irritable bowel syndrome without diarrhea: Secondary | ICD-10-CM

## 2020-01-28 DIAGNOSIS — J3081 Allergic rhinitis due to animal (cat) (dog) hair and dander: Secondary | ICD-10-CM | POA: Diagnosis not present

## 2020-01-28 DIAGNOSIS — J301 Allergic rhinitis due to pollen: Secondary | ICD-10-CM | POA: Diagnosis not present

## 2020-01-28 DIAGNOSIS — J3089 Other allergic rhinitis: Secondary | ICD-10-CM | POA: Diagnosis not present

## 2020-02-10 ENCOUNTER — Encounter: Payer: Self-pay | Admitting: Family Medicine

## 2020-02-11 ENCOUNTER — Other Ambulatory Visit: Payer: Self-pay | Admitting: Internal Medicine

## 2020-02-11 MED ORDER — CHOLESTYRAMINE LIGHT 4 GM/DOSE PO POWD: ORAL | 12 refills | Status: DC

## 2020-02-12 DIAGNOSIS — J3089 Other allergic rhinitis: Secondary | ICD-10-CM | POA: Diagnosis not present

## 2020-02-12 DIAGNOSIS — J3081 Allergic rhinitis due to animal (cat) (dog) hair and dander: Secondary | ICD-10-CM | POA: Diagnosis not present

## 2020-02-12 DIAGNOSIS — J301 Allergic rhinitis due to pollen: Secondary | ICD-10-CM | POA: Diagnosis not present

## 2020-02-25 ENCOUNTER — Other Ambulatory Visit: Payer: Self-pay | Admitting: Family Medicine

## 2020-02-27 DIAGNOSIS — J3089 Other allergic rhinitis: Secondary | ICD-10-CM | POA: Diagnosis not present

## 2020-02-27 DIAGNOSIS — J301 Allergic rhinitis due to pollen: Secondary | ICD-10-CM | POA: Diagnosis not present

## 2020-02-27 DIAGNOSIS — J3081 Allergic rhinitis due to animal (cat) (dog) hair and dander: Secondary | ICD-10-CM | POA: Diagnosis not present

## 2020-02-29 ENCOUNTER — Other Ambulatory Visit: Payer: Self-pay

## 2020-02-29 ENCOUNTER — Ambulatory Visit (INDEPENDENT_AMBULATORY_CARE_PROVIDER_SITE_OTHER): Payer: BC Managed Care – PPO | Admitting: Family Medicine

## 2020-02-29 ENCOUNTER — Encounter: Payer: Self-pay | Admitting: Family Medicine

## 2020-02-29 VITALS — BP 120/64 | HR 76 | Temp 98.1°F | Ht 67.25 in | Wt 200.8 lb

## 2020-02-29 DIAGNOSIS — D649 Anemia, unspecified: Secondary | ICD-10-CM | POA: Diagnosis not present

## 2020-02-29 DIAGNOSIS — F325 Major depressive disorder, single episode, in full remission: Secondary | ICD-10-CM

## 2020-02-29 DIAGNOSIS — E785 Hyperlipidemia, unspecified: Secondary | ICD-10-CM | POA: Diagnosis not present

## 2020-02-29 DIAGNOSIS — Z23 Encounter for immunization: Secondary | ICD-10-CM | POA: Diagnosis not present

## 2020-02-29 DIAGNOSIS — E538 Deficiency of other specified B group vitamins: Secondary | ICD-10-CM | POA: Diagnosis not present

## 2020-02-29 DIAGNOSIS — I10 Essential (primary) hypertension: Secondary | ICD-10-CM | POA: Diagnosis not present

## 2020-02-29 NOTE — Addendum Note (Signed)
Addended by: Francis Dowse T on: 02/29/2020 03:56 PM   Modules accepted: Orders

## 2020-02-29 NOTE — Progress Notes (Signed)
Phone [phone] In person visit   Subjective:   Garrett [name]. is a 44 y.o. year old very pleasant male patient who presents for/with See problem oriented charting Chief Complaint  Patient presents with  . Follow-up  . Hyperlipidemia  . Depression  . Hypertension    This visit occurred during the SARS-CoV-2 public health emergency.  Safety protocols were in place, including screening questions prior to the visit, additional usage of staff PPE, and extensive cleaning of exam room while observing appropriate contact time as indicated for disinfecting solutions.   Past Medical History-  Patient Active Problem List   Diagnosis Date Noted  . Hypertension 02/19/2014    Priority: Medium  . IBS (irritable bowel syndrome) diarrhea predominant 08/14/2012    Priority: Medium  . Allergic rhinitis 02/21/2012    Priority: Medium  . Hyperlipidemia 09/05/2007    Priority: Medium  . Depression 05/25/2007    Priority: Medium  . GERD (gastroesophageal reflux disease) 11/29/2012    Priority: Low    Medications- reviewed and updated Current Outpatient Medications  Medication Sig Dispense Refill  . Alpha-D-Galactosidase (BEANO PO) Take by mouth as needed.    Marland Kitchen azelastine (ASTELIN) 137 MCG/SPRAY nasal spray 1 spray by Nasal route 2 (two) times daily. Use in each nostril as directed     . budesonide (RHINOCORT AQUA) 32 MCG/ACT nasal spray 1 spray by Nasal route daily.      Marland Kitchen buPROPion (WELLBUTRIN XL) 150 MG 24 hr tablet Take 1 tablet (150 mg total) by mouth daily. 90 tablet 3  . Calcium Carbonate Antacid (TUMS PO) Take by mouth as needed.    . cholestyramine light (PREVALITE) 4 GM/DOSE powder 2 g bid with meals 239.4 g 12  . cyanocobalamin 1000 MCG tablet Take 1,000 mcg by mouth daily.    Marland Kitchen desloratadine (CLARINEX) 5 MG tablet Take 5 mg by mouth daily.      Marland Kitchen EPINEPHrine 0.3 mg/0.3 mL IJ SOAJ injection Inject 0.3 mg into the muscle as needed.  0  . escitalopram (LEXAPRO) 20 MG tablet  Take 1 tablet (20 mg total) by mouth daily. 90 tablet 3  . Flavoring Agent (PEPPERMINT FLAVOR) OIL Take 1 capsule by mouth daily.    . hyoscyamine (LEVSIN SL) 0.125 MG SL tablet Place 1 tablet (0.125 mg total) under the tongue every 4 (four) hours as needed. 60 tablet 5  . lisinopril (ZESTRIL) 40 MG tablet Take 1 tablet (40 mg total) by mouth daily. 90 tablet 3  . Loperamide HCl (IMODIUM A-D PO) Take by mouth as needed.    . NON FORMULARY Allergy shots weekly    . Omega-3 Fatty Acids (FISH OIL) 1200 MG CAPS Take 1 capsule by mouth daily.    Marland Kitchen omeprazole (PRILOSEC) 20 MG capsule TAKE 1 CAPSULE BY MOUTH ONCE DAILY 60  MINUTES  BEFORE  BREAKFAST 90 capsule 0  . pravastatin (PRAVACHOL) 40 MG tablet Take 1 tablet (40 mg total) by mouth daily. 90 tablet 3   No current facility-administered medications for this visit.     Objective:  BP 120/64   Pulse 76   Temp 98.1 F (36.7 C)   Ht 5' 7.25" (1.708 m)   Wt 200 lb 12.8 oz (91.1 kg)   SpO2 96%   BMI 31.22 kg/m  Gen: NAD, resting comfortably CV: RRR no murmurs rubs or gallops Lungs: CTAB no crackles, wheeze, rhonchi Ext: no edema Skin: warm, dry     Assessment and Plan   #hypertension S:  compliant with Lisinopril 40Mg . Home checks have been good too  BP Readings from Last 3 Encounters:  02/29/20 120/64  07/31/19 100/68  04/27/19 123/65  A/P: Stable. Continue current medications.   #hyperlipidemia S: compliant with Pravastatin 40mg , Omega 3 Lab Results  Component Value Date   CHOL 149 07/31/2019   HDL 41.60 07/31/2019   LDLCALC 85 10/26/2016   LDLDIRECT 87.0 07/31/2019   TRIG 280.0 (H) 07/31/2019   CHOLHDL 4 07/31/2019   A/P: Mild poor control with LDL goal 70 or less and his numbers at 85-triglycerides also slightly elevated at 280-I think patient can get this down with healthy eating/regular exercise.  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 Winter months harder for him Depression screen [facility] 2/9 02/29/2020  Decreased Interest 0  Down, Depressed, Hopeless 0  PHQ - 2 Score 0  Altered sleeping 1  Tired, decreased energy 1  Change in appetite 0  Feeling bad or failure about yourself  0  Trouble concentrating 0  Moving slowly or fidgety/restless 0  Suicidal thoughts 0  PHQ-9 Score 2  Difficult doing work/chores Not difficult at all  A/P: Reasonable control with full remission-continue current medications  #Slight anemia (could be caused by low b12)-noted on last labs and plan have been stool cards as well as repeat CBC-does not look like this got completed.  We will update this today and get stool cards.   -dad with history of IBD - if he has blood in stool would refer back to GI for their opinion- he had colonoscopy in 2013 and sigmoidoscopy in 2005 .   #Vitamin B12 deficiency-plan was for weekly injections for a month to help bring levels up at September visit (B12 deficiency could be provoked by omeprazole and poor absorption) -he is now on oral b12 Lab Results  Component Value Date   VITAMINB12 151 (L) 07/31/2019  we will check updated b12 level today to see if the oral repletion is adequate or if we need to go with monthly shot   #IBS-continues hyoscyamine rarely. Colestipol global shortage and in and out of being able to get that. Has back up cholestyramine which he does not like nearly as much but he can tolerate it.   Recommended follow up: Return in about 6 months (around 08/30/2020) for physical or sooner if needed.  Lab/Order associations:   ICD-10-CM   1. Essential hypertension  I10   2.  Hyperlipidemia, unspecified hyperlipidemia type  E78.5   3. Major depressive disorder with single episode, in full remission ([place])  F32.5   4. Anemia, unspecified type  D64.9   5. B12 deficiency  E53.8   6. Need for Tdap vaccination  Z23 Tdap vaccine greater than or equal to 7yo IM    No orders of the defined types were placed in this encounter.   Return precautions advised.  [name], MD

## 2020-02-29 NOTE — Addendum Note (Signed)
Addended by: Francis Dowse T on: 02/29/2020 03:18 PM   Modules accepted: Orders

## 2020-02-29 NOTE — Patient Instructions (Addendum)
Health Maintenance Due  Topic Date Due  . TETANUS/TDAP -today 01/30/2020   we will check updated b12 level today to see if the oral repletion is adequate or if we need to go with monthly shot    Please stop by lab before you go If you do not have mychart- we will call you about results within 5 business days of [place] receiving them.  If you have mychart- we will send your results within 3 business days of [place] receiving them.  If abnormal or we want to clarify a result, we will call or mychart you to make sure you receive the message.  If you have questions or concerns or don't hear within 5 business days, please send [place] a message or call us.    Make sure to pick up the stool testing materials that look for blood in the stool

## 2020-02-29 NOTE — Addendum Note (Signed)
Addended by: Francis Dowse T on: 02/29/2020 03:19 PM   Modules accepted: Orders

## 2020-03-01 ENCOUNTER — Encounter: Payer: Self-pay | Admitting: Family Medicine

## 2020-03-01 DIAGNOSIS — E538 Deficiency of other specified B group vitamins: Secondary | ICD-10-CM | POA: Insufficient documentation

## 2020-03-02 LAB — CBC WITH DIFFERENTIAL/PLATELET

## 2020-03-02 LAB — VITAMIN B12: Vitamin B-12: 1154 pg/mL — ABNORMAL HIGH (ref 200–1100)

## 2020-03-03 ENCOUNTER — Other Ambulatory Visit: Payer: Self-pay

## 2020-03-03 ENCOUNTER — Other Ambulatory Visit (INDEPENDENT_AMBULATORY_CARE_PROVIDER_SITE_OTHER): Payer: BC Managed Care – PPO

## 2020-03-03 DIAGNOSIS — I10 Essential (primary) hypertension: Secondary | ICD-10-CM

## 2020-03-03 LAB — CBC WITH DIFFERENTIAL/PLATELET
Basophils Absolute: 0 10*3/uL (ref 0.0–0.1)
Basophils Relative: 0.6 % (ref 0.0–3.0)
Eosinophils Absolute: 0.2 10*3/uL (ref 0.0–0.7)
Eosinophils Relative: 3.9 % (ref 0.0–5.0)
HCT: 35.5 % — ABNORMAL LOW (ref 39.0–52.0)
Hemoglobin: 11.9 g/dL — ABNORMAL LOW (ref 13.0–17.0)
Lymphocytes Relative: 31.9 % (ref 12.0–46.0)
Lymphs Abs: 1.6 10*3/uL (ref 0.7–4.0)
MCHC: 33.5 g/dL (ref 30.0–36.0)
MCV: 78.5 fl (ref 78.0–100.0)
Monocytes Absolute: 0.6 10*3/uL (ref 0.1–1.0)
Monocytes Relative: 12.1 % — ABNORMAL HIGH (ref 3.0–12.0)
Neutro Abs: 2.5 10*3/uL (ref 1.4–7.7)
Neutrophils Relative %: 51.5 % (ref 43.0–77.0)
Platelets: 252 10*3/uL (ref 150.0–400.0)
RBC: 4.52 Mil/uL (ref 4.22–5.81)
RDW: 14.5 % (ref 11.5–15.5)
WBC: 4.9 10*3/uL (ref 4.0–10.5)

## 2020-03-04 ENCOUNTER — Other Ambulatory Visit: Payer: Self-pay

## 2020-03-04 DIAGNOSIS — D649 Anemia, unspecified: Secondary | ICD-10-CM

## 2020-03-12 DIAGNOSIS — J3089 Other allergic rhinitis: Secondary | ICD-10-CM | POA: Diagnosis not present

## 2020-03-12 DIAGNOSIS — J301 Allergic rhinitis due to pollen: Secondary | ICD-10-CM | POA: Diagnosis not present

## 2020-03-12 DIAGNOSIS — J3081 Allergic rhinitis due to animal (cat) (dog) hair and dander: Secondary | ICD-10-CM | POA: Diagnosis not present

## 2020-03-13 ENCOUNTER — Other Ambulatory Visit (INDEPENDENT_AMBULATORY_CARE_PROVIDER_SITE_OTHER): Payer: BC Managed Care – PPO

## 2020-03-13 ENCOUNTER — Encounter: Payer: Self-pay | Admitting: Family Medicine

## 2020-03-13 DIAGNOSIS — D649 Anemia, unspecified: Secondary | ICD-10-CM | POA: Diagnosis not present

## 2020-03-13 LAB — FECAL OCCULT BLOOD, IMMUNOCHEMICAL: Fecal Occult Bld: NEGATIVE

## 2020-03-24 ENCOUNTER — Other Ambulatory Visit: Payer: Self-pay | Admitting: Family Medicine

## 2020-03-24 DIAGNOSIS — F3342 Major depressive disorder, recurrent, in full remission: Secondary | ICD-10-CM

## 2020-03-27 DIAGNOSIS — J3081 Allergic rhinitis due to animal (cat) (dog) hair and dander: Secondary | ICD-10-CM | POA: Diagnosis not present

## 2020-03-27 DIAGNOSIS — J3089 Other allergic rhinitis: Secondary | ICD-10-CM | POA: Diagnosis not present

## 2020-03-27 DIAGNOSIS — J301 Allergic rhinitis due to pollen: Secondary | ICD-10-CM | POA: Diagnosis not present

## 2020-04-08 DIAGNOSIS — J3081 Allergic rhinitis due to animal (cat) (dog) hair and dander: Secondary | ICD-10-CM | POA: Diagnosis not present

## 2020-04-08 DIAGNOSIS — J3089 Other allergic rhinitis: Secondary | ICD-10-CM | POA: Diagnosis not present

## 2020-04-08 DIAGNOSIS — J301 Allergic rhinitis due to pollen: Secondary | ICD-10-CM | POA: Diagnosis not present

## 2020-04-16 ENCOUNTER — Other Ambulatory Visit: Payer: Self-pay

## 2020-04-16 ENCOUNTER — Other Ambulatory Visit (INDEPENDENT_AMBULATORY_CARE_PROVIDER_SITE_OTHER): Payer: BC Managed Care – PPO

## 2020-04-16 DIAGNOSIS — D649 Anemia, unspecified: Secondary | ICD-10-CM | POA: Diagnosis not present

## 2020-04-16 LAB — CBC WITH DIFFERENTIAL/PLATELET
Basophils Absolute: 0 10*3/uL (ref 0.0–0.1)
Basophils Relative: 0.6 % (ref 0.0–3.0)
Eosinophils Absolute: 0.1 10*3/uL (ref 0.0–0.7)
Eosinophils Relative: 1.7 % (ref 0.0–5.0)
HCT: 36.8 % — ABNORMAL LOW (ref 39.0–52.0)
Hemoglobin: 11.9 g/dL — ABNORMAL LOW (ref 13.0–17.0)
Lymphocytes Relative: 27.9 % (ref 12.0–46.0)
Lymphs Abs: 1.5 10*3/uL (ref 0.7–4.0)
MCHC: 32.5 g/dL (ref 30.0–36.0)
MCV: 78.6 fl (ref 78.0–100.0)
Monocytes Absolute: 0.5 10*3/uL (ref 0.1–1.0)
Monocytes Relative: 9.6 % (ref 3.0–12.0)
Neutro Abs: 3.3 10*3/uL (ref 1.4–7.7)
Neutrophils Relative %: 60.2 % (ref 43.0–77.0)
Platelets: 262 10*3/uL (ref 150.0–400.0)
RBC: 4.68 Mil/uL (ref 4.22–5.81)
RDW: 14.5 % (ref 11.5–15.5)
WBC: 5.5 10*3/uL (ref 4.0–10.5)

## 2020-04-17 ENCOUNTER — Other Ambulatory Visit: Payer: Self-pay

## 2020-04-17 DIAGNOSIS — D509 Iron deficiency anemia, unspecified: Secondary | ICD-10-CM

## 2020-04-17 LAB — IRON,TIBC AND FERRITIN PANEL
%SAT: 12 % (calc) — ABNORMAL LOW (ref 20–48)
Ferritin: 10 ng/mL — ABNORMAL LOW (ref 38–380)
Iron: 55 ug/dL (ref 50–180)
TIBC: 452 mcg/dL (calc) — ABNORMAL HIGH (ref 250–425)

## 2020-04-22 DIAGNOSIS — J301 Allergic rhinitis due to pollen: Secondary | ICD-10-CM | POA: Diagnosis not present

## 2020-04-22 DIAGNOSIS — J3089 Other allergic rhinitis: Secondary | ICD-10-CM | POA: Diagnosis not present

## 2020-04-22 DIAGNOSIS — J3081 Allergic rhinitis due to animal (cat) (dog) hair and dander: Secondary | ICD-10-CM | POA: Diagnosis not present

## 2020-05-05 ENCOUNTER — Encounter: Payer: Self-pay | Admitting: Internal Medicine

## 2020-05-07 DIAGNOSIS — J301 Allergic rhinitis due to pollen: Secondary | ICD-10-CM | POA: Diagnosis not present

## 2020-05-07 DIAGNOSIS — J3089 Other allergic rhinitis: Secondary | ICD-10-CM | POA: Diagnosis not present

## 2020-05-07 DIAGNOSIS — J3081 Allergic rhinitis due to animal (cat) (dog) hair and dander: Secondary | ICD-10-CM | POA: Diagnosis not present

## 2020-05-21 DIAGNOSIS — J3081 Allergic rhinitis due to animal (cat) (dog) hair and dander: Secondary | ICD-10-CM | POA: Diagnosis not present

## 2020-05-21 DIAGNOSIS — J301 Allergic rhinitis due to pollen: Secondary | ICD-10-CM | POA: Diagnosis not present

## 2020-05-21 DIAGNOSIS — J3089 Other allergic rhinitis: Secondary | ICD-10-CM | POA: Diagnosis not present

## 2020-05-26 ENCOUNTER — Other Ambulatory Visit: Payer: Self-pay | Admitting: Family Medicine

## 2020-06-05 DIAGNOSIS — J301 Allergic rhinitis due to pollen: Secondary | ICD-10-CM | POA: Diagnosis not present

## 2020-06-05 DIAGNOSIS — J3089 Other allergic rhinitis: Secondary | ICD-10-CM | POA: Diagnosis not present

## 2020-06-05 DIAGNOSIS — J3081 Allergic rhinitis due to animal (cat) (dog) hair and dander: Secondary | ICD-10-CM | POA: Diagnosis not present

## 2020-06-09 ENCOUNTER — Other Ambulatory Visit: Payer: Self-pay | Admitting: Family Medicine

## 2020-06-23 ENCOUNTER — Other Ambulatory Visit: Payer: Self-pay | Admitting: Family Medicine

## 2020-06-23 DIAGNOSIS — F3342 Major depressive disorder, recurrent, in full remission: Secondary | ICD-10-CM

## 2020-06-29 ENCOUNTER — Other Ambulatory Visit: Payer: Self-pay | Admitting: Family Medicine

## 2020-06-30 DIAGNOSIS — J301 Allergic rhinitis due to pollen: Secondary | ICD-10-CM | POA: Diagnosis not present

## 2020-06-30 DIAGNOSIS — J3089 Other allergic rhinitis: Secondary | ICD-10-CM | POA: Diagnosis not present

## 2020-06-30 DIAGNOSIS — J3081 Allergic rhinitis due to animal (cat) (dog) hair and dander: Secondary | ICD-10-CM | POA: Diagnosis not present

## 2020-07-01 ENCOUNTER — Other Ambulatory Visit: Payer: BC Managed Care – PPO

## 2020-07-01 ENCOUNTER — Ambulatory Visit (INDEPENDENT_AMBULATORY_CARE_PROVIDER_SITE_OTHER): Payer: BC Managed Care – PPO | Admitting: Internal Medicine

## 2020-07-01 ENCOUNTER — Encounter: Payer: Self-pay | Admitting: Internal Medicine

## 2020-07-01 VITALS — BP 138/70 | HR 96 | Ht 67.0 in | Wt 198.0 lb

## 2020-07-01 DIAGNOSIS — K219 Gastro-esophageal reflux disease without esophagitis: Secondary | ICD-10-CM | POA: Diagnosis not present

## 2020-07-01 DIAGNOSIS — D508 Other iron deficiency anemias: Secondary | ICD-10-CM

## 2020-07-01 DIAGNOSIS — E538 Deficiency of other specified B group vitamins: Secondary | ICD-10-CM

## 2020-07-01 DIAGNOSIS — K580 Irritable bowel syndrome with diarrhea: Secondary | ICD-10-CM

## 2020-07-01 NOTE — Patient Instructions (Addendum)
Your provider has requested that you go to the basement level for lab work before leaving today. Press "B" on the elevator. The lab is located at the first door on the left as you exit the elevator.   Due to recent changes in healthcare laws, you may see the results of your imaging and laboratory studies on MyChart before your provider has had a chance to review them.  We understand that in some cases there may be results that are confusing or concerning to you. Not all laboratory results come back in the same time frame and the provider may be waiting for multiple results in order to interpret others.  Please give [place] 48 hours in order for your provider to thoroughly review all the results before contacting the office for clarification of your results.   You have been scheduled for an endoscopy and colonoscopy. Please follow the written instructions given to you at your visit today. Please pick up your prep supplies at the pharmacy within the next 1-3 days. If you use inhalers (even only as needed), please bring them with you on the day of your procedure.    HOLD YOUR IRON 3 DAYS PRIOR TO YOUR PROCEDURE.   Normal BMI (Body Mass Index- based on height and weight) is between 19 and 25. Your BMI today is Body mass index is 31.01 kg/m. Marland Kitchen Please consider follow up  regarding your BMI with your Primary Care Provider.   I appreciate the opportunity to care for you. Silvano Rusk, MD, [facility]

## 2020-07-01 NOTE — Progress Notes (Signed)
Garrett [name]. 44 y.o. [birth year] [id number]  Assessment & Plan:   Encounter Diagnoses  Name Primary?  . Vitamin B12 deficiency Yes  . Other iron deficiency anemia   . Irritable bowel syndrome with diarrhea   . Gastroesophageal reflux disease, unspecified whether esophagitis present     Cause is not clear.  Chronic acid suppression could lead to B12 and iron malabsorption.  Perhaps he has an autoimmune gastritis with achlorhydria.  In the past he has been negative on serologic testing for celiac disease but it does remain possible that that could develop later.  He could have Crohn's disease or other inflammatory bowel disease.  These could be nutritional in origin.  I do agree with Dr. Yong Channel that further work-up is necessary.  Note that he was fit test negative but that is limited to evaluation of colonic blood loss and does not exclude upper GI blood loss.  However in the setting of iron deficiency anemia though that is helpful a negative test does not preclude intermittent bleeding and chronic low-grade blood loss.  Plan for an EGD and colonoscopy.  Orders Placed This Encounter  Procedures  . Intrinsic Factor Antibodies  . Tissue transglutaminase, IgA  . Anti-parietal antibody  . Ambulatory referral to Gastroenterology     If studies point to an upper GI origin i.e. celiac disease or autoimmune type gastritis we might be able to drop the colonoscopy.  The risks and benefits as well as alternatives of endoscopic procedure(s) have been discussed and reviewed. All questions answered. The patient agrees to proceed. CC: Marin Olp, MD   Subjective:   Chief Complaint: B12 and iron deficiency  HPI Garrett [name] is a 43 year old single white man that I have seen for IBS in the past (negative celiac serologies, negative random colon biopsies) who was diagnosed with B12 deficiency in late 2020 and also now iron deficiency with mild anemia.  He has had chronic diarrhea  predominant IBS type problems and they have been controlled with bile acid sequestrants mostly colestipol sometimes cholestyramine but not not available taking 2 g twice daily of the colestipol.  Last seen by me in 2017.  Colonoscopy was last done in 2013 and he had a flex sig years before that.  His father has had ulcerative colitis.  He has slight rectal bleeding every 2 to 3 weeks from known hemorrhoids.  Some bloating and gaseousness.  Reflux symptoms have been under control.  Long-term omeprazole.  He does not donate blood.  He has a limited diet with his IBS he does not eat red meat and does not eat a lot of green leafy vegetables.  He has been taking ferrous sulfate about twice a week lately.  A fecal occult blood test was negative.  This was an FIT test.  CBC Latest Ref Rng & Units 04/16/2020 03/03/2020 02/29/2020  WBC 4.0 - 10.5 K/uL 5.5 4.9 CANCELED  Hemoglobin 13.0 - 17.0 g/dL 11.9(L) 11.9(L) -  Hematocrit 39 - 52 % 36.8(L) 35.5(L) -  Platelets 150 - 400 K/uL 262.0 252.0 -   Lab Results  Component Value Date   FERRITIN 10 (L) 04/16/2020    B12 level has corrected with supplementation.  His GI review of systems is otherwise negative.  He has been complaining of some fatigue lately. No Known Allergies Current Meds  Medication Sig  . Alpha-D-Galactosidase (BEANO PO) Take by mouth as needed.  Marland Kitchen azelastine (ASTELIN) 137 MCG/SPRAY nasal spray 1 spray by Nasal route 2 (two)  times daily. Use in each nostril as directed   . budesonide (RHINOCORT AQUA) 32 MCG/ACT nasal spray 1 spray by Nasal route daily.    Marland Kitchen buPROPion (WELLBUTRIN XL) 150 MG 24 hr tablet Take 1 tablet by mouth once daily  . Calcium Carbonate Antacid (TUMS PO) Take by mouth as needed.  . cholestyramine light (PREVALITE) 4 GM/DOSE powder 2 g bid with meals  . colestipol (COLESTID) 1 g tablet Take 2 tablets by mouth twice daily  . cyanocobalamin 1000 MCG tablet Take 1,000 mcg by mouth daily.  Marland Kitchen desloratadine (CLARINEX) 5 MG  tablet Take 5 mg by mouth daily.    Marland Kitchen EPINEPHrine 0.3 mg/0.3 mL IJ SOAJ injection Inject 0.3 mg into the muscle as needed.  Marland Kitchen escitalopram (LEXAPRO) 20 MG tablet Take 1 tablet by mouth once daily  . ferrous sulfate 325 (65 FE) MG tablet Take 325 mg by mouth 2 (two) times a week.  Marland Kitchen Flavoring Agent (PEPPERMINT FLAVOR) OIL Take 1 capsule by mouth daily.  . hyoscyamine (LEVSIN SL) 0.125 MG SL tablet Place 1 tablet (0.125 mg total) under the tongue every 4 (four) hours as needed.  Marland Kitchen lisinopril (ZESTRIL) 40 MG tablet Take 1 tablet by mouth once daily  . Loperamide HCl (IMODIUM A-D PO) Take by mouth as needed.  . NON FORMULARY Allergy shots weekly  . Omega-3 Fatty Acids (FISH OIL) 1200 MG CAPS Take 1 capsule by mouth daily.  Marland Kitchen omeprazole (PRILOSEC) 20 MG capsule TAKE 1 CAPSULE BY MOUTH 60 MINUTES BEFORE BREAKFAST  . pravastatin (PRAVACHOL) 40 MG tablet Take 1 tablet by mouth once daily   Past Medical History:  Diagnosis Date  . Acne   . Allergy   . Anxiety   . Depression   . External hemorrhoids    2004  . GERD (gastroesophageal reflux disease)   . HLD (hyperlipidemia)   . Irritable bowel syndrome   . Strabismus    Past Surgical History:  Procedure Laterality Date  . COLONOSCOPY  10/16/12  . FLEXIBLE SIGMOIDOSCOPY     hemorrhoids-rule out internal bleeding  . STRABISMUS SURGERY  2001, 2010   X 2  . WISDOM TOOTH EXTRACTION     Social History   Social History Narrative   Single. Live with parents.       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   Review of Systems As per HPI all other review of systems negative  Objective:   Physical Exam BP 138/70   Pulse 96   Ht 5\' 7"  (1.702 m)   Wt 198 lb (89.8 kg)    BMI 31.01 kg/m  NAD Anicteric Lungs cta Cor NL abd soft, mildly obese and NT BS + Rectal deferred Alert and oriented x 3 and appropriate mood/affect

## 2020-07-03 DIAGNOSIS — J301 Allergic rhinitis due to pollen: Secondary | ICD-10-CM | POA: Diagnosis not present

## 2020-07-03 DIAGNOSIS — J3081 Allergic rhinitis due to animal (cat) (dog) hair and dander: Secondary | ICD-10-CM | POA: Diagnosis not present

## 2020-07-03 LAB — INTRINSIC FACTOR ANTIBODIES: Intrinsic Factor: NEGATIVE

## 2020-07-03 LAB — ANTI-PARIETAL ANTIBODY: PARIETAL CELL AB SCREEN: NEGATIVE

## 2020-07-03 LAB — TISSUE TRANSGLUTAMINASE, IGA: (tTG) Ab, IgA: 1 U/mL

## 2020-07-04 DIAGNOSIS — J3089 Other allergic rhinitis: Secondary | ICD-10-CM | POA: Diagnosis not present

## 2020-07-16 DIAGNOSIS — J3089 Other allergic rhinitis: Secondary | ICD-10-CM | POA: Diagnosis not present

## 2020-07-16 DIAGNOSIS — J301 Allergic rhinitis due to pollen: Secondary | ICD-10-CM | POA: Diagnosis not present

## 2020-07-16 DIAGNOSIS — J3081 Allergic rhinitis due to animal (cat) (dog) hair and dander: Secondary | ICD-10-CM | POA: Diagnosis not present

## 2020-07-25 ENCOUNTER — Ambulatory Visit (AMBULATORY_SURGERY_CENTER): Payer: BC Managed Care – PPO | Admitting: Internal Medicine

## 2020-07-25 ENCOUNTER — Encounter: Payer: Self-pay | Admitting: Internal Medicine

## 2020-07-25 ENCOUNTER — Other Ambulatory Visit: Payer: Self-pay

## 2020-07-25 VITALS — BP 121/80 | HR 76 | Temp 96.4°F | Resp 15 | Ht 67.0 in | Wt 198.0 lb

## 2020-07-25 DIAGNOSIS — K449 Diaphragmatic hernia without obstruction or gangrene: Secondary | ICD-10-CM

## 2020-07-25 DIAGNOSIS — D508 Other iron deficiency anemias: Secondary | ICD-10-CM | POA: Diagnosis not present

## 2020-07-25 DIAGNOSIS — D509 Iron deficiency anemia, unspecified: Secondary | ICD-10-CM | POA: Diagnosis not present

## 2020-07-25 MED ORDER — SODIUM CHLORIDE 0.9 % IV SOLN: 500.0000 mL | Freq: Once | INTRAVENOUS | Status: DC

## 2020-07-25 NOTE — Patient Instructions (Addendum)
I took some stomach biopsies to check for an infection.  All else looks ok.  Will contact with results and recommendations.  I appreciate the opportunity to care for you. Gatha Mayer, MD, [facility]  Hiatal hernia handout given to patient.  YOU HAD AN ENDOSCOPIC PROCEDURE TODAY AT [facility]:   Refer to the procedure report that was given to you for any specific questions about what was found during the examination.  If the procedure report does not answer your questions, please call your gastroenterologist to clarify.  If you requested that your care partner not be given the details of your procedure findings, then the procedure report has been included in a sealed envelope for you to review at your convenience later.  YOU SHOULD EXPECT: Some feelings of bloating in the abdomen. Passage of more gas than usual.  Walking can help get rid of the air that was put into your GI tract during the procedure and reduce the bloating. If you had a lower endoscopy (such as a colonoscopy or flexible sigmoidoscopy) you may notice spotting of blood in your stool or on the toilet paper. If you underwent a bowel prep for your procedure, you may not have a normal bowel movement for a few days.  Please Note:  You might notice some irritation and congestion in your nose or some drainage.  This is from the oxygen used during your procedure.  There is no need for concern and it should clear up in a day or so.  SYMPTOMS TO REPORT IMMEDIATELY:   Following lower endoscopy (colonoscopy or flexible sigmoidoscopy):  Excessive amounts of blood in the stool  Significant tenderness or worsening of abdominal pains  Swelling of the abdomen that is new, acute  Fever of 100F or higher   Following upper endoscopy (EGD)  Vomiting of blood or coffee ground material  New chest pain or pain under the shoulder blades  Painful or persistently difficult swallowing  New shortness of breath  Fever of 100F or  higher  Black, tarry-looking stools  For urgent or emergent issues, a gastroenterologist can be reached at any hour by calling [phone]. Do not use MyChart messaging for urgent concerns.    DIET:  We do recommend a small meal at first, but then you may proceed to your regular diet.  Drink plenty of fluids but you should avoid alcoholic beverages for 24 hours.  ACTIVITY:  You should plan to take it easy for the rest of today and you should NOT DRIVE or use heavy machinery until tomorrow (because of the sedation medicines used during the test).    FOLLOW UP: Our staff will call the number listed on your records 48-72 hours following your procedure to check on you and address any questions or concerns that you may have regarding the information given to you following your procedure. If we do not reach you, we will leave a message.  We will attempt to reach you two times.  During this call, we will ask if you have developed any symptoms of COVID 19. If you develop any symptoms (ie: fever, flu-like symptoms, shortness of breath, cough etc.) before then, please call [phone].  If you test positive for Covid 19 in the 2 weeks post procedure, please call and report this information to [place].    If any biopsies were taken you will be contacted by phone or by letter within the next 1-3 weeks.  Please call us at [phone] if  you have not heard about the biopsies in 3 weeks.    SIGNATURES/CONFIDENTIALITY: You and/or your care partner have signed paperwork which will be entered into your electronic medical record.  These signatures attest to the fact that that the information above on your After Visit Summary has been reviewed and is understood.  Full responsibility of the confidentiality of this discharge information lies with you and/or your care-partner.

## 2020-07-25 NOTE — Op Note (Signed)
[name] Procedure Date: 07/25/2020 3:19 PM MRN: [id number] Endoscopist: Gatha Mayer , MD Age: 44 Referring MD:  Date of Birth: 02/23/1976 Gender: Male Account #: [id number] Procedure:                Upper GI endoscopy Indications:              Iron deficiency anemia Medicines:                Propofol per Anesthesia, Monitored Anesthesia Care Procedure:                Pre-Anesthesia Assessment:                           - Prior to the procedure, a History and Physical                            was performed, and patient medications and                            allergies were reviewed. The patient's tolerance of                            previous anesthesia was also reviewed. The risks                            and benefits of the procedure and the sedation                            options and risks were discussed with the patient.                            All questions were answered, and informed consent                            was obtained. Prior Anticoagulants: The patient has                            taken no previous anticoagulant or antiplatelet                            agents. ASA Grade Assessment: II - A patient with                            mild systemic disease. After reviewing the risks                            and benefits, the patient was deemed in                            satisfactory condition to undergo the procedure.                           After obtaining informed consent, the endoscope was  passed under direct vision. Throughout the                            procedure, the patient's blood pressure, pulse, and                            oxygen saturations were monitored continuously. The                            Endoscope was introduced through the mouth, and                            advanced to the second part of duodenum. The upper                            GI endoscopy  was accomplished without difficulty.                            The patient tolerated the procedure well. Scope In: Scope Out: Findings:                 Patchy mildly erythematous mucosa without bleeding                            was found in the gastric antrum. Biopsies were                            taken with a cold forceps for Helicobacter pylori                            testing using CLOtest. Verification of patient                            identification for the specimen was done. Estimated                            blood loss was minimal.                           A small hiatal hernia was present.                           The exam was otherwise without abnormality.                           The cardia and gastric fundus were normal on                            retroflexion. Complications:            No immediate complications. Estimated Blood Loss:     Estimated blood loss was minimal. Impression:               - Erythematous mucosa in the antrum. Biopsied.                           -  Small hiatal hernia.                           - The examination was otherwise normal. No clear                            cause of iron or B12 deficiency. Recommendation:           - Patient has a contact number available for                            emergencies. The signs and symptoms of potential                            delayed complications were discussed with the                            patient. Return to normal activities tomorrow.                            Written discharge instructions were provided to the                            patient.                           - Resume previous diet.                           - Continue present medications.                           - See the other procedure note for documentation of                            additional recommendations. Gatha Mayer, MD 07/25/2020 3:57:46 PM This report has been signed electronically.

## 2020-07-25 NOTE — Op Note (Signed)
[name]: Garrett [name] Procedure Date: 07/25/2020 3:19 PM MRN: [id number] Endoscopist: Gatha Mayer , MD Age: 45 Referring MD:  Date of Birth: [birth year] Gender: Male Account #: [id number] Procedure:                Colonoscopy Indications:              Iron deficiency anemia Medicines:                Propofol per Anesthesia, Monitored Anesthesia Care Procedure:                Pre-Anesthesia Assessment:                           - Prior to the procedure, a History and Physical                            was performed, and patient medications and                            allergies were reviewed. The patient's tolerance of                            previous anesthesia was also reviewed. The risks                            and benefits of the procedure and the sedation                            options and risks were discussed with the patient.                            All questions were answered, and informed consent                            was obtained. Prior Anticoagulants: The patient has                            taken no previous anticoagulant or antiplatelet                            agents. ASA Grade Assessment: II - A patient with                            mild systemic disease. After reviewing the risks                            and benefits, the patient was deemed in                            satisfactory condition to undergo the procedure.                           After obtaining informed consent, the colonoscope  was passed under direct vision. Throughout the                            procedure, the patient's blood pressure, pulse, and                            oxygen saturations were monitored continuously. The                            Colonoscope was introduced through the anus and                            advanced to the the cecum, identified by                            appendiceal orifice and  ileocecal valve. The                            quality of the bowel preparation was good. The                            colonoscopy was performed without difficulty. The                            patient tolerated the procedure well. The bowel                            preparation used was Miralax via split dose                            instruction. Scope In: 3:34:48 PM Scope Out: 3:45:52 PM Scope Withdrawal Time: 0 hours 8 minutes 50 seconds  Total Procedure Duration: 0 hours 11 minutes 4 seconds  Findings:                 The perianal and digital rectal examinations were                            normal. Pertinent negatives include normal prostate                            (size, shape, and consistency).                           The colon (entire examined portion) appeared normal.                           No additional abnormalities were found on                            retroflexion. Complications:            No immediate complications. Estimated blood loss:                            None. Estimated Blood Loss:  Estimated blood loss: none. Impression:               - The entire examined colon is normal. No cause of                            iron deficiency seen                           - No specimens collected. Recommendation:           - Repeat colonoscopy in 10 years for screening                            purposes.                           - Patient has a contact number available for                            emergencies. The signs and symptoms of potential                            delayed complications were discussed with the                            patient. Return to normal activities tomorrow.                            Written discharge instructions were provided to the                            patient.                           - Resume previous diet.                           - Continue present medications. Gatha Mayer, MD 07/25/2020 3:59:15  PM This report has been signed electronically.

## 2020-07-25 NOTE — Progress Notes (Signed)
Called to room to assist during endoscopic procedure.  Patient ID and intended procedure confirmed with present staff. Received instructions for my participation in the procedure from the performing physician.  

## 2020-07-25 NOTE — Progress Notes (Signed)
A/ox3, pleased with MAC, report to RN 

## 2020-07-29 LAB — HELICOBACTER PYLORI SCREEN-BIOPSY: UREASE: NEGATIVE

## 2020-07-30 ENCOUNTER — Telehealth: Payer: Self-pay

## 2020-07-30 NOTE — Telephone Encounter (Signed)
  Follow up Call-  Call back number 07/25/2020  Post procedure Call Back phone  # [phone]  Permission to leave phone message Yes  Some recent data might be hidden     Patient questions:  Do you have a fever, pain , or abdominal swelling? No. Pain Score  0 *  Have you tolerated food without any problems? Yes.    Have you been able to return to your normal activities? Yes.    Do you have any questions about your discharge instructions: Diet   No. Medications  No. Follow up visit  No.  Do you have questions or concerns about your Care? No.  Actions: * If pain score is 4 or above: No action needed, pain <4.  1. Have you developed a fever since your procedure? no  2.   Have you had an respiratory symptoms (SOB or cough) since your procedure? no  3.   Have you tested positive for COVID 19 since your procedure no  4.   Have you had any family members/close contacts diagnosed with the COVID 19 since your procedure?  no   If yes to any of these questions please route to Joylene Keion, RN and Joella Prince, RN

## 2020-08-01 DIAGNOSIS — J3089 Other allergic rhinitis: Secondary | ICD-10-CM | POA: Diagnosis not present

## 2020-08-01 DIAGNOSIS — J301 Allergic rhinitis due to pollen: Secondary | ICD-10-CM | POA: Diagnosis not present

## 2020-08-01 DIAGNOSIS — J3081 Allergic rhinitis due to animal (cat) (dog) hair and dander: Secondary | ICD-10-CM | POA: Diagnosis not present

## 2020-08-04 ENCOUNTER — Other Ambulatory Visit: Payer: Self-pay | Admitting: Family Medicine

## 2020-08-08 ENCOUNTER — Telehealth: Payer: Self-pay | Admitting: Internal Medicine

## 2020-08-08 NOTE — Telephone Encounter (Signed)
Recall placed

## 2020-08-08 NOTE — Telephone Encounter (Signed)
Reviewed situation Has never had signs of IBD but low B12 and iron raise ?  IBS sxs stable  Plan is observe for now  Recall office visit 6 mos  Note that chronic ppi can cause low b12 and iron malabsorp

## 2020-08-15 DIAGNOSIS — J3081 Allergic rhinitis due to animal (cat) (dog) hair and dander: Secondary | ICD-10-CM | POA: Diagnosis not present

## 2020-08-15 DIAGNOSIS — J301 Allergic rhinitis due to pollen: Secondary | ICD-10-CM | POA: Diagnosis not present

## 2020-08-15 DIAGNOSIS — J3089 Other allergic rhinitis: Secondary | ICD-10-CM | POA: Diagnosis not present

## 2020-08-19 DIAGNOSIS — J301 Allergic rhinitis due to pollen: Secondary | ICD-10-CM | POA: Diagnosis not present

## 2020-08-19 DIAGNOSIS — J3081 Allergic rhinitis due to animal (cat) (dog) hair and dander: Secondary | ICD-10-CM | POA: Diagnosis not present

## 2020-08-19 DIAGNOSIS — J3089 Other allergic rhinitis: Secondary | ICD-10-CM | POA: Diagnosis not present

## 2020-08-22 DIAGNOSIS — J3089 Other allergic rhinitis: Secondary | ICD-10-CM | POA: Diagnosis not present

## 2020-08-22 DIAGNOSIS — J301 Allergic rhinitis due to pollen: Secondary | ICD-10-CM | POA: Diagnosis not present

## 2020-08-22 DIAGNOSIS — J3081 Allergic rhinitis due to animal (cat) (dog) hair and dander: Secondary | ICD-10-CM | POA: Diagnosis not present

## 2020-08-25 ENCOUNTER — Other Ambulatory Visit: Payer: Self-pay | Admitting: Family Medicine

## 2020-08-25 DIAGNOSIS — J3081 Allergic rhinitis due to animal (cat) (dog) hair and dander: Secondary | ICD-10-CM | POA: Diagnosis not present

## 2020-08-25 DIAGNOSIS — J301 Allergic rhinitis due to pollen: Secondary | ICD-10-CM | POA: Diagnosis not present

## 2020-08-25 DIAGNOSIS — J3089 Other allergic rhinitis: Secondary | ICD-10-CM | POA: Diagnosis not present

## 2020-08-26 ENCOUNTER — Other Ambulatory Visit (HOSPITAL_BASED_OUTPATIENT_CLINIC_OR_DEPARTMENT_OTHER): Payer: Self-pay | Admitting: Internal Medicine

## 2020-08-26 MED FILL — FLUARIX QUADRIVALENT 0.5 ML: 0.5 | 1 days supply | Qty: 1 | Fill #0

## 2020-08-27 DIAGNOSIS — J3081 Allergic rhinitis due to animal (cat) (dog) hair and dander: Secondary | ICD-10-CM | POA: Diagnosis not present

## 2020-08-27 DIAGNOSIS — J3089 Other allergic rhinitis: Secondary | ICD-10-CM | POA: Diagnosis not present

## 2020-08-27 DIAGNOSIS — J301 Allergic rhinitis due to pollen: Secondary | ICD-10-CM | POA: Diagnosis not present

## 2020-09-02 ENCOUNTER — Encounter: Payer: Self-pay | Admitting: Family Medicine

## 2020-09-02 ENCOUNTER — Other Ambulatory Visit: Payer: Self-pay

## 2020-09-02 ENCOUNTER — Ambulatory Visit (INDEPENDENT_AMBULATORY_CARE_PROVIDER_SITE_OTHER): Payer: BC Managed Care – PPO | Admitting: Family Medicine

## 2020-09-02 VITALS — BP 128/68 | HR 77 | Temp 97.3°F | Resp 18 | Ht 67.0 in | Wt 195.8 lb

## 2020-09-02 DIAGNOSIS — Z0000 Encounter for general adult medical examination without abnormal findings: Secondary | ICD-10-CM

## 2020-09-02 DIAGNOSIS — E538 Deficiency of other specified B group vitamins: Secondary | ICD-10-CM

## 2020-09-02 DIAGNOSIS — Z1159 Encounter for screening for other viral diseases: Secondary | ICD-10-CM | POA: Diagnosis not present

## 2020-09-02 DIAGNOSIS — E785 Hyperlipidemia, unspecified: Secondary | ICD-10-CM

## 2020-09-02 DIAGNOSIS — K219 Gastro-esophageal reflux disease without esophagitis: Secondary | ICD-10-CM

## 2020-09-02 DIAGNOSIS — F325 Major depressive disorder, single episode, in full remission: Secondary | ICD-10-CM

## 2020-09-02 DIAGNOSIS — E611 Iron deficiency: Secondary | ICD-10-CM

## 2020-09-02 DIAGNOSIS — Z114 Encounter for screening for human immunodeficiency virus [HIV]: Secondary | ICD-10-CM

## 2020-09-02 DIAGNOSIS — I10 Essential (primary) hypertension: Secondary | ICD-10-CM | POA: Diagnosis not present

## 2020-09-02 DIAGNOSIS — K580 Irritable bowel syndrome with diarrhea: Secondary | ICD-10-CM

## 2020-09-02 NOTE — Progress Notes (Signed)
Phone: [phone]    Subjective:  Patient presents today for their annual physical. Chief complaint-noted.   See problem oriented charting- ROS- full  review of systems was completed and negative  except for: rash on face- primarily where mask hits  The following were reviewed and entered/updated in epic: Past Medical History:  Diagnosis Date  . Acne   . Allergy   . Anxiety   . Depression   . External hemorrhoids    2004  . GERD (gastroesophageal reflux disease)   . HLD (hyperlipidemia)   . Hypertension   . Irritable bowel syndrome   . Strabismus    Patient Active Problem List   Diagnosis Date Noted  . Vitamin B12 deficiency 03/01/2020    Priority: Medium  . Hypertension 02/19/2014    Priority: Medium  . IBS (irritable bowel syndrome) diarrhea predominant 08/14/2012    Priority: Medium  . Allergic rhinitis 02/21/2012    Priority: Medium  . Hyperlipidemia 09/05/2007    Priority: Medium  . Depression 05/25/2007    Priority: Medium  . GERD (gastroesophageal reflux disease) 11/29/2012    Priority: Low   Past Surgical History:  Procedure Laterality Date  . COLONOSCOPY  10/16/12  . FLEXIBLE SIGMOIDOSCOPY     hemorrhoids-rule out internal bleeding  . STRABISMUS SURGERY  2001, 2010   X 2  . WISDOM TOOTH EXTRACTION      Family History  Problem Relation Age of Onset  . Breast cancer Mother   . Hypertension Mother   . Hyperlipidemia Mother   . Prostate cancer Father        late 80s  . Ulcerative colitis Father   . Hypertension Father   . Hyperlipidemia Father   . Colon cancer Neg Hx   . Rectal cancer Neg Hx   . Stomach cancer Neg Hx   . Esophageal cancer Neg Hx     Medications- reviewed and updated Current Outpatient Medications  Medication Sig Dispense Refill  . Alpha-D-Galactosidase (BEANO PO) Take by mouth as needed.    Marland Kitchen azelastine (ASTELIN) 137 MCG/SPRAY nasal spray 1 spray by Nasal route 2 (two) times daily. Use in each nostril as directed     .  budesonide (RHINOCORT AQUA) 32 MCG/ACT nasal spray 1 spray by Nasal route daily.      Marland Kitchen buPROPion (WELLBUTRIN XL) 150 MG 24 hr tablet Take 1 tablet by mouth once daily 90 tablet 0  . Calcium Carbonate Antacid (TUMS PO) Take by mouth as needed.    . cholestyramine light (PREVALITE) 4 GM/DOSE powder 2 g bid with meals 239.4 g 12  . colestipol (COLESTID) 1 g tablet Take 2 tablets by mouth twice daily 240 tablet 0  . cyanocobalamin 1000 MCG tablet Take 1,000 mcg by mouth daily.    Marland Kitchen desloratadine (CLARINEX) 5 MG tablet Take 5 mg by mouth daily.      Marland Kitchen EPINEPHrine 0.3 mg/0.3 mL IJ SOAJ injection Inject 0.3 mg into the muscle as needed.  0  . escitalopram (LEXAPRO) 20 MG tablet Take 1 tablet by mouth once daily 90 tablet 0  . ferrous sulfate 325 (65 FE) MG tablet Take 325 mg by mouth 2 (two) times a week.    Marland Kitchen Flavoring Agent (PEPPERMINT FLAVOR) OIL Take 1 capsule by mouth daily.    . hyoscyamine (LEVSIN SL) 0.125 MG SL tablet Place 1 tablet (0.125 mg total) under the tongue every 4 (four) hours as needed. 60 tablet 5  . lisinopril (ZESTRIL) 40 MG tablet Take 1  tablet by mouth once daily 90 tablet 0  . Loperamide HCl (IMODIUM A-D PO) Take by mouth as needed.    . NON FORMULARY Allergy shots every other week    . Omega-3 Fatty Acids (FISH OIL) 1200 MG CAPS Take 1 capsule by mouth daily.    Marland Kitchen omeprazole (PRILOSEC) 20 MG capsule TAKE 1 CAPSULE BY MOUTH ONCE DAILY 60  MINUTES  BEFORE  BREAKFAST 90 capsule 0  . pravastatin (PRAVACHOL) 40 MG tablet Take 1 tablet by mouth once daily 90 tablet 0   No current facility-administered medications for this visit.    Allergies-reviewed and updated No Known Allergies  Social History   Social History Narrative   Single. Live with parents.       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      Hobbies: reading, time at mall, movies      Habits never smoker 2 caffeinated  beverages daily no alcohol no drug use no other tobacco      Objective:  BP 128/68   Pulse 77   Temp (!) 97.3 F (36.3 C) (Temporal)   Resp 18   Ht 5\' 7"  (1.702 m)   Wt 195 lb 12.8 oz (88.8 kg)   SpO2 97%   BMI 30.67 kg/m  Gen: NAD, resting comfortably HEENT: Mucous membranes are moist. Oropharynx normal Neck: no thyromegaly CV: RRR no murmurs rubs or gallops Lungs: CTAB no crackles, wheeze, rhonchi Abdomen: soft/nontender/nondistended/normal bowel sounds. No rebound or guarding.  Ext: no edema Skin: warm, dry Neuro: grossly normal, moves all extremities, PERRLA    Assessment and Plan:  44 y.o. male presenting for annual physical.  Health Maintenance counseling: 1. Anticipatory guidance: Patient counseled regarding regular dental exams q6 months, eye exams- every 2 years,  avoiding smoking and second hand smoke, limiting alcohol to 2 beverages per day- well under that.   2. Risk factor reduction:  Advised patient of need for regular exercise and diet rich and fruits and vegetables to reduce risk of heart attack and stroke. Exercise- trying to do some walking 2-3x a week for half an hour to hour and a half- goal 150 minutes. Diet- feels could make some improvement- down 5 lbs from April- does worry about the holidays.  Wt Readings from Last 3 Encounters:  09/02/20 195 lb 12.8 oz (88.8 kg)  07/25/20 198 lb (89.8 kg)  07/01/20 198 lb (89.8 kg)  3. Immunizations/screenings/ancillary studies- flu shot last week. Hep c screen opts in.  Immunization History  Administered Date(s) Administered  . Influenza Split 09/11/2012  . Influenza Whole 08/22/2005, 09/04/2007, 08/03/2010, 08/26/2020  . Influenza,inj,Quad PF,6+ Mos 08/09/2013, 09/08/2016, 07/19/2017, 07/31/2018, 07/31/2019  . Influenza-Unspecified 09/05/2014, 08/21/2015  . PFIZER SARS-COV-2 Vaccination 12/06/2019, 12/25/2019  . Td 01/29/2010  . Tdap 03/12/2008, 02/29/2020   4. Prostate cancer screening-  dad with prostate  cancer in 64s, will start screening age 40 for him  5. Colon cancer screening - had colonoscopy and EGD with Dr. Carlean Purl- think symptoms related to IBS. Thought PPI could cause b12 and iron issues. Watchful waiting and will ee in a year.  6. Skin cancer screening/prevention- sees derm every few years. advised regular sunscreen use. Denies worrisome, changing, or new skin lesions.  7. Testicular cancer screening- advised monthly self exams  8. STD screening- patient opts out- advised always use protection and he has not had any unprotected sex 65. Never smoker  Status of chronic or acute concerns   # B12 deficiency/iron deficiency S: Current treatment/medication (oral vs. IM): Cyancobalamin 1057mcg Lab Results  Component Value Date   VITAMINB12 1,154 (H) 02/29/2020   Twice a week iron Lab Results  Component Value Date   FERRITIN 10 (L) 04/16/2020  A/P: Due to history of B12 deficiency and iron deficiency we will update these levels today.  May have been related to PPI use  #hyperlipidemia S: Medication: Pravastatin 40mg , fish oil Lab Results  Component Value Date   CHOL 149 07/31/2019   HDL 41.60 07/31/2019   LDLCALC 85 10/26/2016   LDLDIRECT 87.0 07/31/2019   TRIG 280.0 (H) 07/31/2019   CHOLHDL 4 07/31/2019   A/P: hoping LDL at least below 100. If above this increase dose of medicine- otherwise push for ideal cholesterol LDL under 70 with lifestyle  # Depression S: Medication: Wellbutrin 150mg , Lexapro 20Mg  Depression screen [facility] 2/9 09/02/2020 02/29/2020 07/31/2019  Decreased Interest 0 0 0  Down, Depressed, Hopeless 0 0 0  PHQ - 2 Score 0 0 0  Altered sleeping 0 1 1  Tired, decreased energy 0 1 2  Change in appetite 0 0 0  Feeling bad or failure about yourself  0 0 0  Trouble concentrating 0 0 0  Moving slowly or fidgety/restless 0 0 0  Suicidal thoughts 0 0 0  PHQ-9 Score 0 2 3  Difficult doing work/chores - Not difficult at all Not difficult at all  A/P: discussed  possibly decreasing meds- hed prefer to stay steady for now. Well controlled. Winter can be tougher  # GERD S:Medication: omeprazole 20mg  A/P: Dr. Carlean Purl thought he should continue the PPI for now.   #hypertension S: medication: Lisinopril 40Mg  BP Readings from Last 3 Encounters:  09/02/20 128/68  07/25/20 121/80  07/01/20 138/70  A/P: Stable. Continue current medications.   #IBS- on peppermint oil, colestipol, hyoscyamine (rare use), imodium occasional.   Recommended follow up: Return in about 6 months (around 03/03/2021) for follow up- or sooner if needed.  Lab/Order associations:Non fasting   ICD-10-CM   1. Preventative health care  N17.00 COMPLETE METABOLIC PANEL WITH GFR    Lipid panel  2. Primary hypertension  I10   3. Gastroesophageal reflux disease, unspecified whether esophagitis present  K21.9   4. Major depressive disorder with single episode, in full remission ([place])  F32.5   5. Hyperlipidemia, unspecified hyperlipidemia type  F74.9 COMPLETE METABOLIC PANEL WITH GFR    Lipid panel    CBC With Differential/Platelet  6. Vitamin B12 deficiency  E53.8 Vitamin B12  7. Encounter for hepatitis C screening test for low risk patient  Z11.59 Hepatitis C antibody  8. Screening for HIV without presence of risk factors  Z11.4 HIV Antibody (routine testing w rflx)  9. Iron deficiency  E61.1 Iron, TIBC and Ferritin Panel  10. Irritable bowel syndrome with diarrhea  K58.0     No orders of the defined types were placed in this encounter.   Return precautions advised.   [name], MD

## 2020-09-02 NOTE — Patient Instructions (Addendum)
Please stop by lab before you go If you have mychart- we will send your results within 3 business days of [place] receiving them.  If you do not have mychart- we will call you about results within 5 business days of [place] receiving them.  *please note we are currently using [facility] which has a longer processing time than [facility] typically so labs may not come back as quickly as in the past *please also note that you will see labs on mychart as soon as they post. I will later go in and write notes on them- will say "notes from Dr. Yong Channel"  Set a goal of 5 lbs off by 6 month visit  [place] if every interested  No changes today

## 2020-09-03 LAB — IRON,TIBC AND FERRITIN PANEL
%SAT: 13 % (calc) — ABNORMAL LOW (ref 20–48)
Ferritin: 22 ng/mL — ABNORMAL LOW (ref 38–380)
Iron: 60 ug/dL (ref 50–180)
TIBC: 460 mcg/dL (calc) — ABNORMAL HIGH (ref 250–425)

## 2020-09-03 LAB — COMPLETE METABOLIC PANEL WITH GFR
AG Ratio: 2 (calc) (ref 1.0–2.5)
ALT: 13 U/L (ref 9–46)
AST: 13 U/L (ref 10–40)
Albumin: 4.9 g/dL (ref 3.6–5.1)
Alkaline phosphatase (APISO): 62 U/L (ref 36–130)
BUN: 7 mg/dL (ref 7–25)
CO2: 27 mmol/L (ref 20–32)
Calcium: 10.2 mg/dL (ref 8.6–10.3)
Chloride: 100 mmol/L (ref 98–110)
Creat: 0.78 mg/dL (ref 0.60–1.35)
GFR, Est African American: 127 mL/min/{1.73_m2} (ref >=60)
GFR, Est Non African American: 110 mL/min/{1.73_m2} (ref >=60)
Globulin: 2.4 g/dL (calc) (ref 1.9–3.7)
Glucose, Bld: 80 mg/dL (ref 65–99)
Potassium: 4.4 mmol/L (ref 3.5–5.3)
Sodium: 137 mmol/L (ref 135–146)
Total Bilirubin: 0.9 mg/dL (ref 0.2–1.2)
Total Protein: 7.3 g/dL (ref 6.1–8.1)

## 2020-09-03 LAB — CBC WITH DIFFERENTIAL/PLATELET
Absolute Monocytes: 365 cells/uL (ref 200–950)
Basophils Absolute: 21 cells/uL (ref 0–200)
Basophils Relative: 0.5 %
Eosinophils Absolute: 71 cells/uL (ref 15–500)
Eosinophils Relative: 1.7 %
HCT: 38.3 % — ABNORMAL LOW (ref 38.5–50.0)
Hemoglobin: 12.5 g/dL — ABNORMAL LOW (ref 13.2–17.1)
Lymphs Abs: 1378 cells/uL (ref 850–3900)
MCH: 26.5 pg — ABNORMAL LOW (ref 27.0–33.0)
MCHC: 32.6 g/dL (ref 32.0–36.0)
MCV: 81.1 fL (ref 80.0–100.0)
MPV: 10.2 fL (ref 7.5–12.5)
Monocytes Relative: 8.7 %
Neutro Abs: 2365 cells/uL (ref 1500–7800)
Neutrophils Relative %: 56.3 %
Platelets: 258 10*3/uL (ref 140–400)
RBC: 4.72 10*6/uL (ref 4.20–5.80)
RDW: 13.7 % (ref 11.0–15.0)
Total Lymphocyte: 32.8 %
WBC: 4.2 10*3/uL (ref 3.8–10.8)

## 2020-09-03 LAB — HIV ANTIBODY (ROUTINE TESTING W REFLEX): HIV 1&2 Ab, 4th Generation: NONREACTIVE

## 2020-09-03 LAB — HEPATITIS C ANTIBODY
Hepatitis C Ab: NONREACTIVE
SIGNAL TO CUT-OFF: 0.01 (ref <1.00)

## 2020-09-03 LAB — LIPID PANEL
Cholesterol: 162 mg/dL (ref <200)
HDL: 43 mg/dL (ref >=40)
LDL Cholesterol (Calc): 86 mg/dL (calc)
Non-HDL Cholesterol (Calc): 119 mg/dL (calc) (ref <130)
Total CHOL/HDL Ratio: 3.8 (calc) (ref <5.0)
Triglycerides: 249 mg/dL — ABNORMAL HIGH (ref <150)

## 2020-09-03 LAB — VITAMIN B12: Vitamin B-12: 1103 pg/mL — ABNORMAL HIGH (ref 200–1100)

## 2020-09-08 DIAGNOSIS — J3089 Other allergic rhinitis: Secondary | ICD-10-CM | POA: Diagnosis not present

## 2020-09-08 DIAGNOSIS — J3081 Allergic rhinitis due to animal (cat) (dog) hair and dander: Secondary | ICD-10-CM | POA: Diagnosis not present

## 2020-09-08 DIAGNOSIS — J301 Allergic rhinitis due to pollen: Secondary | ICD-10-CM | POA: Diagnosis not present

## 2020-09-18 ENCOUNTER — Other Ambulatory Visit: Payer: Self-pay | Admitting: Family Medicine

## 2020-09-23 DIAGNOSIS — J3081 Allergic rhinitis due to animal (cat) (dog) hair and dander: Secondary | ICD-10-CM | POA: Diagnosis not present

## 2020-09-23 DIAGNOSIS — J301 Allergic rhinitis due to pollen: Secondary | ICD-10-CM | POA: Diagnosis not present

## 2020-09-23 DIAGNOSIS — J3089 Other allergic rhinitis: Secondary | ICD-10-CM | POA: Diagnosis not present

## 2020-10-06 ENCOUNTER — Other Ambulatory Visit: Payer: Self-pay | Admitting: Family Medicine

## 2020-10-06 DIAGNOSIS — F3342 Major depressive disorder, recurrent, in full remission: Secondary | ICD-10-CM

## 2020-10-08 DIAGNOSIS — J3089 Other allergic rhinitis: Secondary | ICD-10-CM | POA: Diagnosis not present

## 2020-10-08 DIAGNOSIS — J3081 Allergic rhinitis due to animal (cat) (dog) hair and dander: Secondary | ICD-10-CM | POA: Diagnosis not present

## 2020-10-08 DIAGNOSIS — J301 Allergic rhinitis due to pollen: Secondary | ICD-10-CM | POA: Diagnosis not present

## 2020-10-13 ENCOUNTER — Other Ambulatory Visit (HOSPITAL_BASED_OUTPATIENT_CLINIC_OR_DEPARTMENT_OTHER): Payer: Self-pay | Admitting: Internal Medicine

## 2020-10-13 ENCOUNTER — Ambulatory Visit: Payer: BC Managed Care – PPO | Attending: Internal Medicine

## 2020-10-13 DIAGNOSIS — Z23 Encounter for immunization: Secondary | ICD-10-CM

## 2020-10-13 MED FILL — PFIZER-BIONTECH COVID-19 VA: 30 | 1 days supply | Qty: 0 | Fill #0

## 2020-10-13 NOTE — Progress Notes (Signed)
   [facility]  Name:  Garrett [name].    MRN: [id number] DOB: [birth year]  10/13/2020  Garrett [name] was observed post Covid-19 immunization for 15 minutes without incident. He was provided with Vaccine Information Sheet and instruction to access the V-Safe system.   Garrett [name] was instructed to call 911 with any severe reactions post vaccine: Marland Kitchen Difficulty breathing  . Swelling of face and throat  . A fast heartbeat  . A bad rash all over body  . Dizziness and weakness   Immunizations Administered    Name Date Dose VIS Date Route   Pfizer COVID-19 Vaccine 10/13/2020  2:13 PM 0.3 mL 09/10/2020 Intramuscular   Manufacturer: [place]   Lot: [id number]   [place]: [id number]     Vaccine given by Beckey Rutter, pharmacy student

## 2020-10-21 DIAGNOSIS — J301 Allergic rhinitis due to pollen: Secondary | ICD-10-CM | POA: Diagnosis not present

## 2020-10-21 DIAGNOSIS — J3089 Other allergic rhinitis: Secondary | ICD-10-CM | POA: Diagnosis not present

## 2020-10-21 DIAGNOSIS — J3081 Allergic rhinitis due to animal (cat) (dog) hair and dander: Secondary | ICD-10-CM | POA: Diagnosis not present

## 2020-11-05 DIAGNOSIS — J3089 Other allergic rhinitis: Secondary | ICD-10-CM | POA: Diagnosis not present

## 2020-11-05 DIAGNOSIS — J3081 Allergic rhinitis due to animal (cat) (dog) hair and dander: Secondary | ICD-10-CM | POA: Diagnosis not present

## 2020-11-05 DIAGNOSIS — J301 Allergic rhinitis due to pollen: Secondary | ICD-10-CM | POA: Diagnosis not present

## 2020-11-18 DIAGNOSIS — J3089 Other allergic rhinitis: Secondary | ICD-10-CM | POA: Diagnosis not present

## 2020-11-18 DIAGNOSIS — J3081 Allergic rhinitis due to animal (cat) (dog) hair and dander: Secondary | ICD-10-CM | POA: Diagnosis not present

## 2020-11-18 DIAGNOSIS — J301 Allergic rhinitis due to pollen: Secondary | ICD-10-CM | POA: Diagnosis not present

## 2020-11-24 ENCOUNTER — Other Ambulatory Visit: Payer: Self-pay | Admitting: Family Medicine

## 2020-12-01 DIAGNOSIS — J3081 Allergic rhinitis due to animal (cat) (dog) hair and dander: Secondary | ICD-10-CM | POA: Diagnosis not present

## 2020-12-01 DIAGNOSIS — J3089 Other allergic rhinitis: Secondary | ICD-10-CM | POA: Diagnosis not present

## 2020-12-01 DIAGNOSIS — J301 Allergic rhinitis due to pollen: Secondary | ICD-10-CM | POA: Diagnosis not present

## 2020-12-17 DIAGNOSIS — J3081 Allergic rhinitis due to animal (cat) (dog) hair and dander: Secondary | ICD-10-CM | POA: Diagnosis not present

## 2020-12-17 DIAGNOSIS — J3089 Other allergic rhinitis: Secondary | ICD-10-CM | POA: Diagnosis not present

## 2020-12-17 DIAGNOSIS — J301 Allergic rhinitis due to pollen: Secondary | ICD-10-CM | POA: Diagnosis not present

## 2021-01-01 DIAGNOSIS — J3081 Allergic rhinitis due to animal (cat) (dog) hair and dander: Secondary | ICD-10-CM | POA: Diagnosis not present

## 2021-01-01 DIAGNOSIS — J3089 Other allergic rhinitis: Secondary | ICD-10-CM | POA: Diagnosis not present

## 2021-01-01 DIAGNOSIS — J301 Allergic rhinitis due to pollen: Secondary | ICD-10-CM | POA: Diagnosis not present

## 2021-01-12 DIAGNOSIS — J3089 Other allergic rhinitis: Secondary | ICD-10-CM | POA: Diagnosis not present

## 2021-01-12 DIAGNOSIS — J3081 Allergic rhinitis due to animal (cat) (dog) hair and dander: Secondary | ICD-10-CM | POA: Diagnosis not present

## 2021-01-12 DIAGNOSIS — J301 Allergic rhinitis due to pollen: Secondary | ICD-10-CM | POA: Diagnosis not present

## 2021-01-19 ENCOUNTER — Other Ambulatory Visit: Payer: Self-pay | Admitting: Family Medicine

## 2021-01-19 DIAGNOSIS — F3342 Major depressive disorder, recurrent, in full remission: Secondary | ICD-10-CM

## 2021-01-26 DIAGNOSIS — J3081 Allergic rhinitis due to animal (cat) (dog) hair and dander: Secondary | ICD-10-CM | POA: Diagnosis not present

## 2021-01-26 DIAGNOSIS — J301 Allergic rhinitis due to pollen: Secondary | ICD-10-CM | POA: Diagnosis not present

## 2021-01-26 DIAGNOSIS — J3089 Other allergic rhinitis: Secondary | ICD-10-CM | POA: Diagnosis not present

## 2021-02-10 DIAGNOSIS — J301 Allergic rhinitis due to pollen: Secondary | ICD-10-CM | POA: Diagnosis not present

## 2021-02-10 DIAGNOSIS — J3089 Other allergic rhinitis: Secondary | ICD-10-CM | POA: Diagnosis not present

## 2021-02-10 DIAGNOSIS — J3081 Allergic rhinitis due to animal (cat) (dog) hair and dander: Secondary | ICD-10-CM | POA: Diagnosis not present

## 2021-02-16 ENCOUNTER — Other Ambulatory Visit: Payer: Self-pay | Admitting: Family Medicine

## 2021-02-23 ENCOUNTER — Other Ambulatory Visit: Payer: Self-pay

## 2021-02-23 ENCOUNTER — Ambulatory Visit (INDEPENDENT_AMBULATORY_CARE_PROVIDER_SITE_OTHER): Payer: BC Managed Care – PPO | Admitting: Internal Medicine

## 2021-02-23 ENCOUNTER — Other Ambulatory Visit (INDEPENDENT_AMBULATORY_CARE_PROVIDER_SITE_OTHER): Payer: BC Managed Care – PPO

## 2021-02-23 ENCOUNTER — Encounter: Payer: Self-pay | Admitting: Internal Medicine

## 2021-02-23 VITALS — BP 138/76 | HR 92 | Ht 66.5 in | Wt 200.0 lb

## 2021-02-23 DIAGNOSIS — E538 Deficiency of other specified B group vitamins: Secondary | ICD-10-CM

## 2021-02-23 DIAGNOSIS — K580 Irritable bowel syndrome with diarrhea: Secondary | ICD-10-CM

## 2021-02-23 DIAGNOSIS — D500 Iron deficiency anemia secondary to blood loss (chronic): Secondary | ICD-10-CM

## 2021-02-23 LAB — CBC WITH DIFFERENTIAL/PLATELET
Basophils Absolute: 0 10*3/uL (ref 0.0–0.1)
Basophils Relative: 0.5 % (ref 0.0–3.0)
Eosinophils Absolute: 0.1 10*3/uL (ref 0.0–0.7)
Eosinophils Relative: 1.8 % (ref 0.0–5.0)
HCT: 38.7 % — ABNORMAL LOW (ref 39.0–52.0)
Hemoglobin: 13.3 g/dL (ref 13.0–17.0)
Lymphocytes Relative: 28.3 % (ref 12.0–46.0)
Lymphs Abs: 1.4 10*3/uL (ref 0.7–4.0)
MCHC: 34.3 g/dL (ref 30.0–36.0)
MCV: 82.3 fl (ref 78.0–100.0)
Monocytes Absolute: 0.5 10*3/uL (ref 0.1–1.0)
Monocytes Relative: 10 % (ref 3.0–12.0)
Neutro Abs: 2.9 10*3/uL (ref 1.4–7.7)
Neutrophils Relative %: 59.4 % (ref 43.0–77.0)
Platelets: 242 10*3/uL (ref 150.0–400.0)
RBC: 4.7 Mil/uL (ref 4.22–5.81)
RDW: 13.7 % (ref 11.5–15.5)
WBC: 4.9 10*3/uL (ref 4.0–10.5)

## 2021-02-23 LAB — FOLATE: Folate: 17.4 ng/mL (ref >5.9)

## 2021-02-23 LAB — VITAMIN D 25 HYDROXY (VIT D DEFICIENCY, FRACTURES): VITD: 10.48 ng/mL — ABNORMAL LOW (ref 30.00–100.00)

## 2021-02-23 LAB — VITAMIN B12: Vitamin B-12: 1259 pg/mL — ABNORMAL HIGH (ref 211–911)

## 2021-02-23 LAB — FERRITIN: Ferritin: 31.2 ng/mL (ref 22.0–322.0)

## 2021-02-23 NOTE — Progress Notes (Signed)
Garrett [name]. 45 y.o. November 16, 1976 [id number]  Assessment & Plan:   Encounter Diagnoses  Name Primary?  . Iron deficiency anemia due to chronic blood loss - possible Yes  . B12 deficiency   . Irritable bowel syndrome with diarrhea    Lab follow-up as below I think a capsule endoscopy of the small intestine makes sense perhaps he has AVMs but he could have mild Crohn's disease that we have been thinking is IBS-D.  Side effects risks benefits of the procedure explained and he understands and agrees to proceed. Orders Placed This Encounter  Procedures  . Ferritin  . Vitamin B12  . CBC with Differential/Platelet  . Folate  . Vitamin D (25 hydroxy)   I appreciate the opportunity to care for this patient. CC: Marin Olp, MD   Subjective:   Chief Complaint: Iron deficiency anemia B12 deficiency  HPI Garrett [name] is a 45 year old white man with B12 and iron deficiency for unclear reasons, in the setting of chronic diarrhea and IBS diarrhea predominant diagnosis.  EGD and colonoscopy late last summer, celiac testing, antibody testing for pernicious anemia all negative.  He is taking iron supplementation and B12 by mouth.  He is here for follow-up to discuss additional testing.  Last labs were in October.   IBS symptoms are stable he feels okay. No Known Allergies Current Meds  Medication Sig  . Alpha-D-Galactosidase (BEANO PO) Take by mouth as needed.  Marland Kitchen azelastine (ASTELIN) 137 MCG/SPRAY nasal spray Place 1 spray into the nose 2 (two) times daily. Use in each nostril as directed  . budesonide (RHINOCORT AQUA) 32 MCG/ACT nasal spray Place 1 spray into the nose daily.  Marland Kitchen buPROPion (WELLBUTRIN XL) 150 MG 24 hr tablet Take 1 tablet by mouth once daily  . Calcium Carbonate Antacid (TUMS PO) Take by mouth as needed.  . cholestyramine light (PREVALITE) 4 GM/DOSE powder 2 g bid with meals  . colestipol (COLESTID) 1 g tablet Take 2 tablets by mouth twice daily  . COVID-19 mRNA  vaccine, Pfizer, 30 MCG/0.3ML injection AS DIRECTED  . cyanocobalamin 1000 MCG tablet Take 1,000 mcg by mouth daily.  Marland Kitchen desloratadine (CLARINEX) 5 MG tablet Take 5 mg by mouth daily.  Marland Kitchen EPINEPHrine 0.3 mg/0.3 mL IJ SOAJ injection Inject 0.3 mg into the muscle as needed.  Marland Kitchen escitalopram (LEXAPRO) 20 MG tablet Take 1 tablet by mouth once daily  . ferrous sulfate 325 (65 FE) MG tablet Take 325 mg by mouth 2 (two) times a week.  Marland Kitchen Flavoring Agent (PEPPERMINT FLAVOR) OIL Take 1 capsule by mouth daily.  . hyoscyamine (LEVSIN SL) 0.125 MG SL tablet Place 1 tablet (0.125 mg total) under the tongue every 4 (four) hours as needed.  . influenza vac split quadrivalent PF (FLUARIX) 0.5 ML injection TAKE AS DIRECTED  . lisinopril (ZESTRIL) 40 MG tablet Take 1 tablet by mouth once daily  . Loperamide HCl (IMODIUM A-D PO) Take by mouth as needed.  . NON FORMULARY Allergy shots every other week  . Omega-3 Fatty Acids (FISH OIL) 1200 MG CAPS Take 1 capsule by mouth daily.  Marland Kitchen omeprazole (PRILOSEC) 20 MG capsule TAKE 1 CAPSULE BY MOUTH ONCE DAILY 60  MINUTES  BEFORE  BREAKFAST  . pravastatin (PRAVACHOL) 40 MG tablet Take 1 tablet by mouth once daily   Past Medical History:  Diagnosis Date  . Acne   . Allergy   . Anxiety   . Depression   . External hemorrhoids    2004  .  GERD (gastroesophageal reflux disease)   . HLD (hyperlipidemia)   . Hypertension   . Irritable bowel syndrome   . Strabismus    Past Surgical History:  Procedure Laterality Date  . COLONOSCOPY  10/16/12  . FLEXIBLE SIGMOIDOSCOPY     hemorrhoids-rule out internal bleeding  . STRABISMUS SURGERY  2001, 2010   X 2  . WISDOM TOOTH EXTRACTION     Social History   Social History Narrative   Single. Live with parents.       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      Hobbies: reading, time at mall, movies      Habits never smoker 2 caffeinated  beverages daily no alcohol no drug use no other tobacco   family history includes Breast cancer in his mother; Hyperlipidemia in his father and mother; Hypertension in his father and mother; Prostate cancer in his father; Ulcerative colitis in his father.   Review of Systems As per HPI  Objective:   Physical Exam BP 138/76 (BP Location: Left Arm, Patient Position: Sitting, Cuff Size: Normal)   Pulse 92   Ht 5' 6.5" (1.689 m) Comment: height measured without shoes  Wt 200 lb (90.7 kg)   BMI 31.80 kg/m

## 2021-02-23 NOTE — Patient Instructions (Signed)
Your provider has requested that you go to the basement level for lab work before leaving today. Press "B" on the elevator. The lab is located at the first door on the left as you exit the elevator.  Due to recent changes in healthcare laws, you may see the results of your imaging and laboratory studies on MyChart before your provider has had a chance to review them.  We understand that in some cases there may be results that are confusing or concerning to you. Not all laboratory results come back in the same time frame and the provider may be waiting for multiple results in order to interpret others.  Please give [place] 48 hours in order for your provider to thoroughly review all the results before contacting the office for clarification of your results.   CAPSULE ENDOSCOPY PATIENT INSTRUCTION SHEET  Garrett [name]. 1975-12-11 [id number]   1. 03/17/2021 Seven (7) days prior to capsule endoscopy stop taking iron supplements and carafate.  2. 03/22/2021 Two (2) days prior to capsule endoscopy stop taking aspirin or any arthritis drugs.  3. 03/23/2021 Day before capsule endoscopy purchase a 238 gram bottle of Miralax from the laxative section of your drug store, and a 32 oz. bottle of Gatorade (no red).    4. 03/23/2021 One (1) day prior to capsule endoscopy: a) Stop smoking. b) Eat a regular diet until 12:00 Noon. c) After 12:00 Noon take only the following: Black coffee  Jell-O (no fruit or red Jell-o) Water   Bouillon (chicken or beef) 7-Up   Cranberry Juice Tea   Kool-Aid Popsicle (not red) Sprite   Coke Ginger Ale  Pepsi Mountain Dew Gatorade d) At 6:00 pm the evening before your appointment, drink 7 capfuls (105 grams) of Miralax with 32 oz. Gatorade. Drink 8 oz every 15 minutes until gone. e) Nothing to eat or drink after midnight except medications with a sip of water.  5. 03/24/2021 Day of capsule endoscopy:  No medications for 2 hours prior to your test.  6. Please arrive at [facility]  3rd floor patient registration area by 8:15am on: 03/24/2021.   For any questions: Call [place] at [phone] and ask to speak with one of the capsule endoscopy nurses.  YOU WILL NEED TO RETURN THE EQUIPMENT AT 4 PM ON THE DAY OF THE PROCEDURE.  PLEASE KEEP THIS IN MIND WHEN SCHEDULING.   The above instructions have been reviewed and explained to me by___Patti Martinique, CMA____   Patient signature:_________________________________________     Date:___4/4/22_____  Small Bowel Capsule Endoscopy  What you should know: Small Bowel capsule endoscopy is a procedure that takes pictures of the inside of your small intestine (bowel).  Your small bowel connects to your stomach on one end, and your large bowel (colon) on the other.  A capsule endoscopy is done by swallowing a pill size camera.  The capsule moves through your stomach and into your small bowel, where pictures are taken.   You may need a small bowel capsule endoscopy if you have symptoms, such as blood in your stool, chronic stomach pain, and diarrhea.  The pictures may show if you have growths, swelling, and bleeding area in you small bowel.  A capsule endoscopy may also show if diseases such as Crohn's or celiac disease are causing your symptoms.  Having a small bowel capsule endoscopy may help you and your caregiver learn the cause of your symptoms.  Learning what is causing your symptoms allows you to  receive needed treatment and prevent further problems. Risks: . You may have stomach pain during your procedure.   . The pictures taken by the capsule may not be clear.   . The pictures may not show the cause of your symptoms.   . You may need another endoscopy procedure.  .  The capsule may get trapped in your esophagus or intestines. You may need surgery or additional procedures to remove the capsule from your body.    Before your procedure: You will be instructed to stop certain prescription medications or over- the  -counter medications prior to the procedure.   The day before your scheduled appointment you will need to be on a restricted diet and will need to drink a bowel prep that will clean out your bowels.   The day of the procedure: . You may drive yourself to the procedure.   . You will need to plan on 2 trips to the office on the day of the procedure. Morning: . Plan to be at the office about 45 minutes. . The morning of the procedure a sensor belt and recorder will be placed on you.  You will wear this for 8 hours.  (The sensor belt transfers pictures of your small bowel to the recorder.)   You will be given a pill-sized capsule endoscope to swallow.  Once you swallow the capsule it will travel through your body the same way food does, constantly taking pictures along the way.  The capsule takes 2-3 pictures a second.   . Once you have left the office you may go about your normal day with a few exceptions: You may not go near a MRI machine or a radio or television towers; You need to avoid other patients having capsule endoscopy; You will be given a written diet to follow for the day.  Afternoon: . You will need to be return to the office at your designated time. . The sensors belt will be removed . You will need to be at the office about 15 minutes.  I appreciate the opportunity to care for you. Silvano Rusk, MD, [facility]

## 2021-02-27 DIAGNOSIS — J301 Allergic rhinitis due to pollen: Secondary | ICD-10-CM | POA: Diagnosis not present

## 2021-02-27 DIAGNOSIS — J3089 Other allergic rhinitis: Secondary | ICD-10-CM | POA: Diagnosis not present

## 2021-02-27 DIAGNOSIS — J3081 Allergic rhinitis due to animal (cat) (dog) hair and dander: Secondary | ICD-10-CM | POA: Diagnosis not present

## 2021-03-03 ENCOUNTER — Ambulatory Visit: Payer: BC Managed Care – PPO | Admitting: Family Medicine

## 2021-03-04 ENCOUNTER — Other Ambulatory Visit: Payer: Self-pay | Admitting: Internal Medicine

## 2021-03-04 ENCOUNTER — Encounter: Payer: Self-pay | Admitting: Internal Medicine

## 2021-03-04 DIAGNOSIS — E559 Vitamin D deficiency, unspecified: Secondary | ICD-10-CM

## 2021-03-04 HISTORY — DX: Vitamin D deficiency, unspecified: E55.9

## 2021-03-04 MED ORDER — VITAMIN D (ERGOCALCIFEROL) 1.25 MG (50000 UNIT) PO CAPS: 50000.0000 [IU] | ORAL_CAPSULE | ORAL | 0 refills | Status: DC

## 2021-03-12 ENCOUNTER — Encounter: Payer: Self-pay | Admitting: Family Medicine

## 2021-03-12 DIAGNOSIS — J019 Acute sinusitis, unspecified: Secondary | ICD-10-CM | POA: Diagnosis not present

## 2021-03-12 DIAGNOSIS — J301 Allergic rhinitis due to pollen: Secondary | ICD-10-CM | POA: Diagnosis not present

## 2021-03-12 DIAGNOSIS — J3081 Allergic rhinitis due to animal (cat) (dog) hair and dander: Secondary | ICD-10-CM | POA: Diagnosis not present

## 2021-03-12 DIAGNOSIS — J3089 Other allergic rhinitis: Secondary | ICD-10-CM | POA: Diagnosis not present

## 2021-03-13 ENCOUNTER — Telehealth: Payer: Self-pay | Admitting: Internal Medicine

## 2021-03-13 NOTE — Telephone Encounter (Signed)
Appt moved to 6/2 at 8:30am.

## 2021-03-13 NOTE — Telephone Encounter (Signed)
Pt needs to r/s capsule endo that is scheduled on 5/3.

## 2021-03-13 NOTE — Telephone Encounter (Signed)
Can be rescheduled to any open appointment

## 2021-03-16 ENCOUNTER — Other Ambulatory Visit: Payer: Self-pay | Admitting: Family Medicine

## 2021-03-20 ENCOUNTER — Ambulatory Visit: Payer: BC Managed Care – PPO | Admitting: Family Medicine

## 2021-03-20 DIAGNOSIS — J3089 Other allergic rhinitis: Secondary | ICD-10-CM | POA: Diagnosis not present

## 2021-03-20 DIAGNOSIS — J301 Allergic rhinitis due to pollen: Secondary | ICD-10-CM | POA: Diagnosis not present

## 2021-03-20 DIAGNOSIS — J3081 Allergic rhinitis due to animal (cat) (dog) hair and dander: Secondary | ICD-10-CM | POA: Diagnosis not present

## 2021-03-25 DIAGNOSIS — J301 Allergic rhinitis due to pollen: Secondary | ICD-10-CM | POA: Diagnosis not present

## 2021-03-25 DIAGNOSIS — J3081 Allergic rhinitis due to animal (cat) (dog) hair and dander: Secondary | ICD-10-CM | POA: Diagnosis not present

## 2021-03-26 DIAGNOSIS — J3089 Other allergic rhinitis: Secondary | ICD-10-CM | POA: Diagnosis not present

## 2021-03-30 ENCOUNTER — Other Ambulatory Visit: Payer: Self-pay | Admitting: Family Medicine

## 2021-03-30 DIAGNOSIS — F3342 Major depressive disorder, recurrent, in full remission: Secondary | ICD-10-CM

## 2021-03-31 DIAGNOSIS — J301 Allergic rhinitis due to pollen: Secondary | ICD-10-CM | POA: Diagnosis not present

## 2021-03-31 DIAGNOSIS — J3081 Allergic rhinitis due to animal (cat) (dog) hair and dander: Secondary | ICD-10-CM | POA: Diagnosis not present

## 2021-03-31 DIAGNOSIS — J3089 Other allergic rhinitis: Secondary | ICD-10-CM | POA: Diagnosis not present

## 2021-04-15 DIAGNOSIS — J3089 Other allergic rhinitis: Secondary | ICD-10-CM | POA: Diagnosis not present

## 2021-04-15 DIAGNOSIS — J3081 Allergic rhinitis due to animal (cat) (dog) hair and dander: Secondary | ICD-10-CM | POA: Diagnosis not present

## 2021-04-15 DIAGNOSIS — J301 Allergic rhinitis due to pollen: Secondary | ICD-10-CM | POA: Diagnosis not present

## 2021-04-16 ENCOUNTER — Ambulatory Visit (INDEPENDENT_AMBULATORY_CARE_PROVIDER_SITE_OTHER): Payer: BC Managed Care – PPO | Admitting: Family Medicine

## 2021-04-16 ENCOUNTER — Encounter: Payer: Self-pay | Admitting: Family Medicine

## 2021-04-16 ENCOUNTER — Other Ambulatory Visit: Payer: Self-pay

## 2021-04-16 VITALS — BP 132/82 | HR 93 | Temp 97.0°F | Ht 67.0 in | Wt 192.8 lb

## 2021-04-16 DIAGNOSIS — E785 Hyperlipidemia, unspecified: Secondary | ICD-10-CM

## 2021-04-16 DIAGNOSIS — E538 Deficiency of other specified B group vitamins: Secondary | ICD-10-CM

## 2021-04-16 DIAGNOSIS — I10 Essential (primary) hypertension: Secondary | ICD-10-CM

## 2021-04-16 DIAGNOSIS — F325 Major depressive disorder, single episode, in full remission: Secondary | ICD-10-CM | POA: Insufficient documentation

## 2021-04-16 DIAGNOSIS — E559 Vitamin D deficiency, unspecified: Secondary | ICD-10-CM

## 2021-04-16 DIAGNOSIS — K580 Irritable bowel syndrome with diarrhea: Secondary | ICD-10-CM | POA: Diagnosis not present

## 2021-04-16 NOTE — Patient Instructions (Addendum)
When you finish the high-dose vitamin D try to take at least 1000 units a day- could likely even take 2000 units a day-we can recheck at your physicalj   Recommended follow up: No follow-ups on file.

## 2021-04-16 NOTE — Progress Notes (Signed)
Phone [phone] In person visit   Subjective:   Garrett [name]. is a 45 y.o. year old very pleasant male patient who presents for/with See problem oriented charting Chief Complaint  Patient presents with  . Hyperlipidemia  . Hypertension   This visit occurred during the SARS-CoV-2 public health emergency.  Safety protocols were in place, including screening questions prior to the visit, additional usage of staff PPE, and extensive cleaning of exam room while observing appropriate contact time as indicated for disinfecting solutions.   Past Medical History-  Patient Active Problem List   Diagnosis Date Noted  . Vitamin D deficiency 03/04/2021    Priority: Medium  . Vitamin B12 deficiency 03/01/2020    Priority: Medium  . Hypertension 02/19/2014    Priority: Medium  . IBS (irritable bowel syndrome) diarrhea predominant 08/14/2012    Priority: Medium  . Allergic rhinitis 02/21/2012    Priority: Medium  . Hyperlipidemia 09/05/2007    Priority: Medium  . Depression 05/25/2007    Priority: Medium  . GERD (gastroesophageal reflux disease) 11/29/2012    Priority: Low  . Major depressive disorder with single episode, in full remission ([place]) 04/16/2021    Medications- reviewed and updated Current Outpatient Medications  Medication Sig Dispense Refill  . Alpha-D-Galactosidase (BEANO PO) Take by mouth as needed.    Marland Kitchen azelastine (ASTELIN) 137 MCG/SPRAY nasal spray Place 1 spray into the nose 2 (two) times daily. Use in each nostril as directed    . budesonide (RHINOCORT AQUA) 32 MCG/ACT nasal spray Place 1 spray into the nose daily.    Marland Kitchen buPROPion (WELLBUTRIN XL) 150 MG 24 hr tablet Take 1 tablet by mouth once daily 90 tablet 0  . Calcium Carbonate Antacid (TUMS PO) Take by mouth as needed.    . cholestyramine light (PREVALITE) 4 GM/DOSE powder 2 g bid with meals 239.4 g 12  . colestipol (COLESTID) 1 g tablet Take 2 tablets by mouth twice daily 240 tablet 0  . cyanocobalamin  1000 MCG tablet Take 1,000 mcg by mouth daily.    Marland Kitchen desloratadine (CLARINEX) 5 MG tablet Take 5 mg by mouth daily.    Marland Kitchen EPINEPHrine 0.3 mg/0.3 mL IJ SOAJ injection Inject 0.3 mg into the muscle as needed.  0  . escitalopram (LEXAPRO) 20 MG tablet Take 1 tablet by mouth once daily 90 tablet 0  . ferrous sulfate 325 (65 FE) MG tablet Take 325 mg by mouth 2 (two) times a week.    Marland Kitchen Flavoring Agent (PEPPERMINT FLAVOR) OIL Take 1 capsule by mouth daily.    . hyoscyamine (LEVSIN SL) 0.125 MG SL tablet Place 1 tablet (0.125 mg total) under the tongue every 4 (four) hours as needed. 60 tablet 5  . lisinopril (ZESTRIL) 40 MG tablet Take 1 tablet by mouth once daily 90 tablet 0  . Loperamide HCl (IMODIUM A-D PO) Take by mouth as needed.    . NON FORMULARY Allergy shots every other week    . Omega-3 Fatty Acids (FISH OIL) 1200 MG CAPS Take 1 capsule by mouth daily.    Marland Kitchen omeprazole (PRILOSEC) 20 MG capsule TAKE 1 CAPSULE BY MOUTH ONCE DAILY 60  MINUTES  BEFORE  BREAKFAST 90 capsule 0  . pravastatin (PRAVACHOL) 40 MG tablet Take 1 tablet by mouth once daily 90 tablet 0  . Vitamin D, Ergocalciferol, (DRISDOL) 1.25 MG (50000 UNIT) CAPS capsule Take 1 capsule (50,000 Units total) by mouth every 7 (seven) days. 12 capsule 0   No current  facility-administered medications for this visit.     Objective:  BP 132/82   Pulse 93   Temp (!) 97 F (36.1 C) (Temporal)   Ht 5\' 7"  (1.702 m)   Wt 192 lb 12.8 oz (87.5 kg)   SpO2 98%   BMI 30.20 kg/m  Gen: NAD, resting comfortably Neck: no carotid bruits CV: RRR no murmurs rubs or gallops, no carotid bruits Lungs: CTAB no crackles, wheeze, rhonchi Abdomen: soft/nontender/nondistended/normal bowel sounds. No rebound or guarding.  Ext: no edema Skin: warm, dry     Assessment and Plan    #hypertension S: medication: Lisinopril 40 mg Home readings #s: 125/83 BP Readings from Last 3 Encounters:  04/16/21 132/82  02/23/21 138/76  09/02/20 128/68  A/P:  Stable problem-continue current medicine.   #hyperlipidemia S: Medication: Pravastatin 40 mg, fish oil. Lab Results  Component Value Date   CHOL 162 09/02/2020   HDL 43 09/02/2020   LDLCALC 86 09/02/2020   LDLDIRECT 87.0 07/31/2019   TRIG 249 (H) 09/02/2020   CHOLHDL 3.8 09/02/2020   A/P: Reasonable control for primary prevention-continue current medications-discussed working on healthy eating/regular exercise for triglycerides  # Depression S: Medication: Wellbutrin 150 mg, Lexapro 20 mg A/P: full remission with no anhedonia or depressed mood- continue current medicine   # GERD-also follows with Dr. Carlean Purl S:Medication: Omeprazole 20 mg A/P: Reflux reasonably well-controlled-continue current medication.   #B12 deficiency (potentially related to B12 use)/iron deficiency/now with vitamin D deficiency S: Medication: Vitamin B12 1000 mcg, twice a week iron -No clear cause in 2021 on EGD or colonoscopy.  Upcoming capsule endoscopy study -Dad with ulcerative colitis and this weighs on patient A/P: B12 has been adequately repleted.  Iron deficiency is improving with last ferritin over 30-continue current medications.  See below for vitamin D discussion   # Vitamin D Deficieny S:Med: high dose 50000 units a day for 12 weeks A/P: Poor control but improving- due 1000-2000 units a day once he finishes high-dose  # Sinus infection S: received an antibiotic through his allergist and reports this cleared situation A/P: Plan symptoms have resolved-continue to monitor  #IBS- patient continues peppermint oil, colestipol, hyoscyamine, rare use), occasional Imodium  Recommended follow up: Return in about 6 months (around 10/17/2021) for physical. Future Appointments  Date Time Provider [place]  04/23/2021  8:30 AM [facility] DI[facility]NOSTIC TESTING [facility]  10/20/2021  2:40 PM Muscab Brenneman, Brayton Mars, MD [facility]    Lab/Order associations:   ICD-10-CM   1. Primary  hypertension  I10   2. Hyperlipidemia, unspecified hyperlipidemia type  E78.5   3. Irritable bowel syndrome with diarrhea  K58.0   4. Vitamin B12 deficiency  E53.8   5. Vitamin D deficiency  E55.9   6. Major depressive disorder with single episode, in full remission ([place])  F32.5    I,Harris Phan,acting as a scribe for [name], MD.,have documented all relevant documentation on the behalf of [name], MD,as directed by  [name], MD while in the presence of [name], MD.   I, [name], MD, have reviewed all documentation for this visit. The documentation on 04/16/21 for the exam, diagnosis, procedures, and orders are all accurate and complete.   Return precautions advised.  [name], MD

## 2021-04-23 ENCOUNTER — Other Ambulatory Visit: Payer: Self-pay

## 2021-04-23 ENCOUNTER — Encounter: Payer: Self-pay | Admitting: Internal Medicine

## 2021-04-23 ENCOUNTER — Ambulatory Visit (INDEPENDENT_AMBULATORY_CARE_PROVIDER_SITE_OTHER): Payer: BC Managed Care – PPO | Admitting: Internal Medicine

## 2021-04-23 DIAGNOSIS — D500 Iron deficiency anemia secondary to blood loss (chronic): Secondary | ICD-10-CM | POA: Diagnosis not present

## 2021-04-23 NOTE — Progress Notes (Signed)
CAPSULE ID: [id number] Exp: 2022-07-07 LOT: [id number]  Patient arrived for capsule endoscopy. He was accompanied by his mother. Reported the prep went well. Confirmed patient is fasting. Explained dietary restrictions for the next few hours. Patient verbalized understanding. Opened capsule, ensured capsule was flashing and transmitting to the recorder prior to the patient swallowing the capsule. Patient swallowed capsule without difficulty. Patient told to call the office with any questions. Understands to return to the office today between 4:00 and 4:30 pm. No further questions by the conclusion of the visit.

## 2021-04-28 DIAGNOSIS — J3081 Allergic rhinitis due to animal (cat) (dog) hair and dander: Secondary | ICD-10-CM | POA: Diagnosis not present

## 2021-04-28 DIAGNOSIS — J3089 Other allergic rhinitis: Secondary | ICD-10-CM | POA: Diagnosis not present

## 2021-04-28 DIAGNOSIS — J301 Allergic rhinitis due to pollen: Secondary | ICD-10-CM | POA: Diagnosis not present

## 2021-05-11 ENCOUNTER — Other Ambulatory Visit: Payer: Self-pay | Admitting: Internal Medicine

## 2021-05-11 DIAGNOSIS — T184XXA Foreign body in colon, initial encounter: Secondary | ICD-10-CM

## 2021-05-11 DIAGNOSIS — T183XXA Foreign body in small intestine, initial encounter: Secondary | ICD-10-CM

## 2021-05-13 ENCOUNTER — Ambulatory Visit (INDEPENDENT_AMBULATORY_CARE_PROVIDER_SITE_OTHER)
Admission: RE | Admit: 2021-05-13 | Discharge: 2021-05-13 | Disposition: A | Discharge status: Home / Self Care | Payer: BC Managed Care – PPO | Source: Ambulatory Visit | Attending: Internal Medicine | Admitting: Internal Medicine

## 2021-05-13 ENCOUNTER — Other Ambulatory Visit: Payer: Self-pay

## 2021-05-13 DIAGNOSIS — T184XXA Foreign body in colon, initial encounter: Secondary | ICD-10-CM | POA: Diagnosis not present

## 2021-05-13 DIAGNOSIS — T183XXA Foreign body in small intestine, initial encounter: Secondary | ICD-10-CM | POA: Diagnosis not present

## 2021-05-13 DIAGNOSIS — Z0389 Encounter for observation for other suspected diseases and conditions ruled out: Secondary | ICD-10-CM | POA: Diagnosis not present

## 2021-05-18 ENCOUNTER — Other Ambulatory Visit: Payer: Self-pay | Admitting: Family Medicine

## 2021-05-20 DIAGNOSIS — J3081 Allergic rhinitis due to animal (cat) (dog) hair and dander: Secondary | ICD-10-CM | POA: Diagnosis not present

## 2021-05-20 DIAGNOSIS — J301 Allergic rhinitis due to pollen: Secondary | ICD-10-CM | POA: Diagnosis not present

## 2021-05-20 DIAGNOSIS — J3089 Other allergic rhinitis: Secondary | ICD-10-CM | POA: Diagnosis not present

## 2021-05-26 DIAGNOSIS — J301 Allergic rhinitis due to pollen: Secondary | ICD-10-CM | POA: Diagnosis not present

## 2021-05-26 DIAGNOSIS — J3089 Other allergic rhinitis: Secondary | ICD-10-CM | POA: Diagnosis not present

## 2021-05-26 DIAGNOSIS — J3081 Allergic rhinitis due to animal (cat) (dog) hair and dander: Secondary | ICD-10-CM | POA: Diagnosis not present

## 2021-05-28 DIAGNOSIS — J3081 Allergic rhinitis due to animal (cat) (dog) hair and dander: Secondary | ICD-10-CM | POA: Diagnosis not present

## 2021-05-28 DIAGNOSIS — J301 Allergic rhinitis due to pollen: Secondary | ICD-10-CM | POA: Diagnosis not present

## 2021-05-28 DIAGNOSIS — J3089 Other allergic rhinitis: Secondary | ICD-10-CM | POA: Diagnosis not present

## 2021-06-01 ENCOUNTER — Other Ambulatory Visit: Payer: Self-pay | Admitting: Internal Medicine

## 2021-06-01 ENCOUNTER — Other Ambulatory Visit: Payer: Self-pay | Admitting: Family Medicine

## 2021-06-01 DIAGNOSIS — J3081 Allergic rhinitis due to animal (cat) (dog) hair and dander: Secondary | ICD-10-CM | POA: Diagnosis not present

## 2021-06-01 DIAGNOSIS — E559 Vitamin D deficiency, unspecified: Secondary | ICD-10-CM

## 2021-06-01 DIAGNOSIS — D500 Iron deficiency anemia secondary to blood loss (chronic): Secondary | ICD-10-CM

## 2021-06-01 DIAGNOSIS — J301 Allergic rhinitis due to pollen: Secondary | ICD-10-CM | POA: Diagnosis not present

## 2021-06-01 DIAGNOSIS — J3089 Other allergic rhinitis: Secondary | ICD-10-CM | POA: Diagnosis not present

## 2021-06-02 ENCOUNTER — Other Ambulatory Visit (INDEPENDENT_AMBULATORY_CARE_PROVIDER_SITE_OTHER): Payer: BC Managed Care – PPO

## 2021-06-02 DIAGNOSIS — E559 Vitamin D deficiency, unspecified: Secondary | ICD-10-CM

## 2021-06-02 DIAGNOSIS — D500 Iron deficiency anemia secondary to blood loss (chronic): Secondary | ICD-10-CM | POA: Diagnosis not present

## 2021-06-02 LAB — CBC
HCT: 39.7 % (ref 39.0–52.0)
Hemoglobin: 13.4 g/dL (ref 13.0–17.0)
MCHC: 33.8 g/dL (ref 30.0–36.0)
MCV: 82.1 fl (ref 78.0–100.0)
Platelets: 269 10*3/uL (ref 150.0–400.0)
RBC: 4.83 Mil/uL (ref 4.22–5.81)
RDW: 13.4 % (ref 11.5–15.5)
WBC: 5.6 10*3/uL (ref 4.0–10.5)

## 2021-06-02 LAB — VITAMIN D 25 HYDROXY (VIT D DEFICIENCY, FRACTURES): VITD: 31.1 ng/mL (ref 30.00–100.00)

## 2021-06-02 LAB — FERRITIN: Ferritin: 50.8 ng/mL (ref 22.0–322.0)

## 2021-06-04 DIAGNOSIS — J301 Allergic rhinitis due to pollen: Secondary | ICD-10-CM | POA: Diagnosis not present

## 2021-06-04 DIAGNOSIS — J3081 Allergic rhinitis due to animal (cat) (dog) hair and dander: Secondary | ICD-10-CM | POA: Diagnosis not present

## 2021-06-04 DIAGNOSIS — J3089 Other allergic rhinitis: Secondary | ICD-10-CM | POA: Diagnosis not present

## 2021-06-08 DIAGNOSIS — J3081 Allergic rhinitis due to animal (cat) (dog) hair and dander: Secondary | ICD-10-CM | POA: Diagnosis not present

## 2021-06-08 DIAGNOSIS — J3089 Other allergic rhinitis: Secondary | ICD-10-CM | POA: Diagnosis not present

## 2021-06-08 DIAGNOSIS — J301 Allergic rhinitis due to pollen: Secondary | ICD-10-CM | POA: Diagnosis not present

## 2021-06-10 ENCOUNTER — Other Ambulatory Visit: Payer: Self-pay | Admitting: Internal Medicine

## 2021-06-10 DIAGNOSIS — J3081 Allergic rhinitis due to animal (cat) (dog) hair and dander: Secondary | ICD-10-CM | POA: Diagnosis not present

## 2021-06-10 DIAGNOSIS — J301 Allergic rhinitis due to pollen: Secondary | ICD-10-CM | POA: Diagnosis not present

## 2021-06-10 DIAGNOSIS — J3089 Other allergic rhinitis: Secondary | ICD-10-CM | POA: Diagnosis not present

## 2021-06-10 MED ORDER — VITAMIN D (ERGOCALCIFEROL) 1.25 MG (50000 UNIT) PO CAPS: 50000.0000 [IU] | ORAL_CAPSULE | ORAL | 0 refills | Status: AC

## 2021-06-15 ENCOUNTER — Other Ambulatory Visit: Payer: Self-pay | Admitting: Family Medicine

## 2021-06-22 ENCOUNTER — Other Ambulatory Visit: Payer: Self-pay | Admitting: Family Medicine

## 2021-06-22 DIAGNOSIS — J3089 Other allergic rhinitis: Secondary | ICD-10-CM | POA: Diagnosis not present

## 2021-06-22 DIAGNOSIS — J3081 Allergic rhinitis due to animal (cat) (dog) hair and dander: Secondary | ICD-10-CM | POA: Diagnosis not present

## 2021-06-22 DIAGNOSIS — F3342 Major depressive disorder, recurrent, in full remission: Secondary | ICD-10-CM

## 2021-06-22 DIAGNOSIS — J301 Allergic rhinitis due to pollen: Secondary | ICD-10-CM | POA: Diagnosis not present

## 2021-06-29 ENCOUNTER — Other Ambulatory Visit: Payer: Self-pay | Admitting: Family Medicine

## 2021-06-29 DIAGNOSIS — F3342 Major depressive disorder, recurrent, in full remission: Secondary | ICD-10-CM

## 2021-07-07 DIAGNOSIS — J3081 Allergic rhinitis due to animal (cat) (dog) hair and dander: Secondary | ICD-10-CM | POA: Diagnosis not present

## 2021-07-07 DIAGNOSIS — J301 Allergic rhinitis due to pollen: Secondary | ICD-10-CM | POA: Diagnosis not present

## 2021-07-07 DIAGNOSIS — J3089 Other allergic rhinitis: Secondary | ICD-10-CM | POA: Diagnosis not present

## 2021-07-30 DIAGNOSIS — J3081 Allergic rhinitis due to animal (cat) (dog) hair and dander: Secondary | ICD-10-CM | POA: Diagnosis not present

## 2021-07-30 DIAGNOSIS — J3089 Other allergic rhinitis: Secondary | ICD-10-CM | POA: Diagnosis not present

## 2021-07-30 DIAGNOSIS — J301 Allergic rhinitis due to pollen: Secondary | ICD-10-CM | POA: Diagnosis not present

## 2021-08-03 ENCOUNTER — Other Ambulatory Visit: Payer: Self-pay | Admitting: Family Medicine

## 2021-08-12 DIAGNOSIS — J301 Allergic rhinitis due to pollen: Secondary | ICD-10-CM | POA: Diagnosis not present

## 2021-08-12 DIAGNOSIS — J3089 Other allergic rhinitis: Secondary | ICD-10-CM | POA: Diagnosis not present

## 2021-08-12 DIAGNOSIS — J3081 Allergic rhinitis due to animal (cat) (dog) hair and dander: Secondary | ICD-10-CM | POA: Diagnosis not present

## 2021-08-17 ENCOUNTER — Other Ambulatory Visit: Payer: Self-pay | Admitting: Family Medicine

## 2021-08-19 ENCOUNTER — Other Ambulatory Visit (INDEPENDENT_AMBULATORY_CARE_PROVIDER_SITE_OTHER): Payer: BC Managed Care – PPO

## 2021-08-19 ENCOUNTER — Ambulatory Visit (INDEPENDENT_AMBULATORY_CARE_PROVIDER_SITE_OTHER): Payer: BC Managed Care – PPO | Admitting: Internal Medicine

## 2021-08-19 ENCOUNTER — Encounter: Payer: Self-pay | Admitting: Internal Medicine

## 2021-08-19 VITALS — BP 118/70 | HR 88 | Ht 67.0 in | Wt 198.2 lb

## 2021-08-19 DIAGNOSIS — K580 Irritable bowel syndrome with diarrhea: Secondary | ICD-10-CM | POA: Diagnosis not present

## 2021-08-19 DIAGNOSIS — E559 Vitamin D deficiency, unspecified: Secondary | ICD-10-CM | POA: Diagnosis not present

## 2021-08-19 DIAGNOSIS — D500 Iron deficiency anemia secondary to blood loss (chronic): Secondary | ICD-10-CM

## 2021-08-19 LAB — CBC WITH DIFFERENTIAL/PLATELET
Basophils Absolute: 0 10*3/uL (ref 0.0–0.1)
Basophils Relative: 0.6 % (ref 0.0–3.0)
Eosinophils Absolute: 0.1 10*3/uL (ref 0.0–0.7)
Eosinophils Relative: 1.4 % (ref 0.0–5.0)
HCT: 40.2 % (ref 39.0–52.0)
Hemoglobin: 13.6 g/dL (ref 13.0–17.0)
Lymphocytes Relative: 30.6 % (ref 12.0–46.0)
Lymphs Abs: 1.5 10*3/uL (ref 0.7–4.0)
MCHC: 33.8 g/dL (ref 30.0–36.0)
MCV: 83.1 fl (ref 78.0–100.0)
Monocytes Absolute: 0.5 10*3/uL (ref 0.1–1.0)
Monocytes Relative: 9.6 % (ref 3.0–12.0)
Neutro Abs: 2.8 10*3/uL (ref 1.4–7.7)
Neutrophils Relative %: 57.8 % (ref 43.0–77.0)
Platelets: 243 10*3/uL (ref 150.0–400.0)
RBC: 4.84 Mil/uL (ref 4.22–5.81)
RDW: 12.9 % (ref 11.5–15.5)
WBC: 4.9 10*3/uL (ref 4.0–10.5)

## 2021-08-19 LAB — VITAMIN D 25 HYDROXY (VIT D DEFICIENCY, FRACTURES): VITD: 32.62 ng/mL (ref 30.00–100.00)

## 2021-08-19 LAB — FERRITIN: Ferritin: 63.4 ng/mL (ref 22.0–322.0)

## 2021-08-19 NOTE — Patient Instructions (Addendum)
Your provider has requested that you go to the basement level for lab work before leaving today. Press "B" on the elevator. The lab is located at the first door on the left as you exit the elevator.  Due to recent changes in healthcare laws, you may see the results of your imaging and laboratory studies on MyChart before your provider has had a chance to review them.  We understand that in some cases there may be results that are confusing or concerning to you. Not all laboratory results come back in the same time frame and the provider may be waiting for multiple results in order to interpret others.  Please give [place] 48 hours in order for your provider to thoroughly review all the results before contacting the office for clarification of your results.   Be on stand bye for [place] to contact you about lab results and plans.   I appreciate the opportunity to care for you. Silvano Rusk, MD, [facility]

## 2021-08-19 NOTE — Progress Notes (Signed)
Garrett Garrett [name]. 45 y.o. 1976-08-21 [id number]  Assessment & Plan:   Encounter Diagnoses  Name Primary?   Iron deficiency anemia due to chronic blood loss? Yes   Irritable bowel syndrome with diarrhea    Vitamin D deficiency    Orders Placed This Encounter  Procedures   CBC with Differential/Platelet   Ferritin   Vitamin D (25 hydroxy)     Plan for EGD and place capsule endoscope given capsule retention before.  Garrett problems may be nutritional in origin but think we should better exclude small bowel inflammation.  Subjective:   Chief Complaint: iron and vitamin D deficiency, f/u after failed capsule  HPI Garrett Garrett [name] is here w/ Garrett [name] today. He has IBS-D dx but we have been evaluating iron deficiency and vitamin D deficiency and treating. Slow to respond to treatment. Attempted capsule endoscopy earlier this year but capsule sat in stomach x 8 hrs. Prior EGD w/o a cause for that.  Diet is lacking in green vegetables, fruits. Processed predominance. Not much if any sig sun exposure.   Wt Readings from Last 3 Encounters:  08/19/21 198 lb 4 oz (89.9 kg)  04/16/21 192 lb 12.8 oz (87.5 kg)  02/23/21 200 lb (90.7 kg)     No Known Allergies Current Meds  Medication Sig   Alpha-D-Galactosidase (BEANO PO) Take by mouth as needed.   azelastine (ASTELIN) 137 MCG/SPRAY nasal spray Place 1 spray into the nose 2 (two) times daily. Use in each nostril as directed   budesonide (RHINOCORT AQUA) 32 MCG/ACT nasal spray Place 1 spray into the nose daily.   buPROPion (WELLBUTRIN XL) 150 MG 24 hr tablet Take 1 tablet by mouth once daily   Calcium Carbonate Antacid (TUMS PO) Take by mouth as needed.   cholestyramine light (PREVALITE) 4 GM/DOSE powder 2 g bid with meals   colestipol (COLESTID) 1 g tablet Take 2 tablets by mouth twice daily   cyanocobalamin 1000 MCG tablet Take 1,000 mcg by mouth daily.   desloratadine (CLARINEX) 5 MG tablet Take 5 mg by mouth daily.   EPINEPHrine 0.3 mg/0.3  mL IJ SOAJ injection Inject 0.3 mg into the muscle as needed.   escitalopram (LEXAPRO) 20 MG tablet Take 1 tablet by mouth once daily   ferrous sulfate 325 (65 FE) MG tablet Take 325 mg by mouth 2 (two) times a week.   Flavoring Agent (PEPPERMINT FLAVOR) OIL Take 1 capsule by mouth daily.   hyoscyamine (LEVSIN SL) 0.125 MG SL tablet Place 1 tablet (0.125 mg total) under the tongue every 4 (four) hours as needed.   lisinopril (ZESTRIL) 40 MG tablet Take 1 tablet by mouth once daily   Loperamide HCl (IMODIUM A-D PO) Take by mouth as needed.   NON FORMULARY Allergy shots every other week   Omega-3 Fatty Acids (FISH OIL) 1200 MG CAPS Take 1 capsule by mouth daily.   omeprazole (PRILOSEC) 20 MG capsule TAKE 1 CAPSULE BY MOUTH ONCE DAILY BEFORE BREAKFAST   pravastatin (PRAVACHOL) 40 MG tablet Take 1 tablet by mouth once daily   Past Medical History:  Diagnosis Date   Acne    Allergy    Anxiety    Depression    External hemorrhoids    2004   GERD (gastroesophageal reflux disease)    HLD (hyperlipidemia)    Hypertension    Irritable bowel syndrome    Strabismus    Vitamin D deficiency 03/04/2021   Past Surgical History:  Procedure Laterality Date   COLONOSCOPY  10/16/12   FLEXIBLE SIGMOIDOSCOPY     hemorrhoids-rule out internal bleeding   STRABISMUS SURGERY  2001, 2010   X 2   WISDOM TOOTH EXTRACTION     Social History   Social History Narrative   Single. Live with parents.       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      Hobbies: reading, time at mall, movies      Habits never smoker 2 caffeinated beverages daily no alcohol no drug use no other tobacco   family history includes Breast cancer in Garrett mother; Hyperlipidemia in Garrett father and mother; Hypertension in Garrett father and mother; Prostate cancer in Garrett father; Ulcerative colitis in Garrett father.   Review of Systems As above  Objective:    Physical Exam BP 118/70   Pulse 88   Ht 5\' 7"  (1.702 m)   Wt 198 lb 4 oz (89.9 kg)   BMI 31.05 kg/m  Overweight NAD Lungs cta Cor NL Abd benign

## 2021-08-21 ENCOUNTER — Telehealth: Payer: Self-pay

## 2021-08-21 DIAGNOSIS — D500 Iron deficiency anemia secondary to blood loss (chronic): Secondary | ICD-10-CM

## 2021-08-21 NOTE — Telephone Encounter (Signed)
Patient has EGD and capsule instructions so he will have all the information. He is to follow the capsule prep since he will need to clean out his bowels.

## 2021-08-21 NOTE — Telephone Encounter (Signed)
Barnabas Lister set up for a EGD and Dr Carlean Purl will place the capsule at that time. Instructions will be mailed.

## 2021-08-25 DIAGNOSIS — J301 Allergic rhinitis due to pollen: Secondary | ICD-10-CM | POA: Diagnosis not present

## 2021-08-25 DIAGNOSIS — J3089 Other allergic rhinitis: Secondary | ICD-10-CM | POA: Diagnosis not present

## 2021-08-25 DIAGNOSIS — J3081 Allergic rhinitis due to animal (cat) (dog) hair and dander: Secondary | ICD-10-CM | POA: Diagnosis not present

## 2021-09-05 ENCOUNTER — Other Ambulatory Visit: Payer: Self-pay | Admitting: Family Medicine

## 2021-09-10 DIAGNOSIS — J3081 Allergic rhinitis due to animal (cat) (dog) hair and dander: Secondary | ICD-10-CM | POA: Diagnosis not present

## 2021-09-10 DIAGNOSIS — J3089 Other allergic rhinitis: Secondary | ICD-10-CM | POA: Diagnosis not present

## 2021-09-10 DIAGNOSIS — J301 Allergic rhinitis due to pollen: Secondary | ICD-10-CM | POA: Diagnosis not present

## 2021-09-16 ENCOUNTER — Telehealth: Payer: Self-pay

## 2021-09-16 MED ORDER — METOCLOPRAMIDE HCL 10 MG PO TABS: 10.0000 mg | ORAL_TABLET | Freq: Once | ORAL | 0 refills | Status: DC

## 2021-09-16 NOTE — Telephone Encounter (Signed)
Patient is scheduled for EGD and capsule endo tomorrow in the [facility].  Insurance company has not authorized the Capsule endo.  I spoke with the patient and his mother and father.  They want to proceed anyway.  They understand that they will be responsible for the bill.  He understands that the insurance company has made a commitment to call in the am with an answer.  Discussed all with Dr. Carlean Purl.  The capsule monitor has real time capabilities and will monitor after the patient has ingested capsule.  If the capsule leaves stomach on its own, will cancel EGD.  Patient notified to pick up rx for reglan 10 mg 1 po at 6:30 am tomorrow with a sip of water.  He agrees to plan.

## 2021-09-17 ENCOUNTER — Other Ambulatory Visit: Payer: Self-pay

## 2021-09-17 ENCOUNTER — Telehealth: Payer: Self-pay | Admitting: Internal Medicine

## 2021-09-17 ENCOUNTER — Encounter: Payer: BC Managed Care – PPO | Admitting: Internal Medicine

## 2021-09-17 ENCOUNTER — Encounter: Payer: Self-pay | Admitting: Internal Medicine

## 2021-09-17 ENCOUNTER — Ambulatory Visit (INDEPENDENT_AMBULATORY_CARE_PROVIDER_SITE_OTHER): Payer: BC Managed Care – PPO | Admitting: Internal Medicine

## 2021-09-17 DIAGNOSIS — D500 Iron deficiency anemia secondary to blood loss (chronic): Secondary | ICD-10-CM

## 2021-09-17 DIAGNOSIS — K5520 Angiodysplasia of colon without hemorrhage: Secondary | ICD-10-CM

## 2021-09-17 NOTE — Patient Instructions (Signed)
Contact our office immediately at [phone] if you suffer from any abdominal pain, nausea, or vomiting during capsule endoscopy. a) Do not eat or drink for at least 2 hours. After 2 hours you may have any of the following to drink: Water   White grape juice 7-Up   Chicken Bouillon Sprite   Ginger Ale c) After 4 hours you may have a light snack to include any of the following: A cup of soup   sandwich Bowl of cereal  Rice Toast   Eggs 2-3 small cookies (i.e. vanilla wafers or graham crackers) d) After 8 hours you may return to your regular diet. During your procedure do not go near anyone else that is having capsule endoscopy. Do not be in close contact with an MRI machine or a radio or television tower. Do not wear a heavy coat or sweater because your recorder may over heat and stop recording.   Do not disconnect the equipment or remove the belt at any time.  Since the Data Recorder is actually a small computer, it should be treated with utmost care and protection.  Avoid sudden movement and banging of the Data Recorder.  Do not do any heavy lifting or strenuous physical activity during the test especially if it involves sweating and do not bend over or stoop during capsule endoscopy. During capsule endoscopy, you will need to verify every 15 minutes that the small light on top of the Data Recorder is blinking twice per second.  If for some reason it stops blinking at this site, record the time and contact our office at [phone].   Patient Name @name ID No: @MRN   Time Event (eating, drinking, activity and unusual sensations)                         Who to call in case of need: Time to return to facility: 4:00     Special Instructions:                          

## 2021-09-17 NOTE — Progress Notes (Signed)
SN: [id number] Q Exp: 2023 11 17 LOT: [id number] Patient arrived for Capsule Endoscopy. Reported the prep went well. This nurse explained dietary restrictions for the next few hours. Patient verbalized understanding. Opened capsule, ensured capsule was flashing prior to the patient swallowing the capsule. Patient swallowed capsule without difficulty.

## 2021-09-17 NOTE — Telephone Encounter (Signed)
[name] - since you are in [facility] I will get PJ to fax the records

## 2021-09-17 NOTE — Telephone Encounter (Signed)
All the information has been faxed to # [phone] and I got confirmation it went thru.

## 2021-09-17 NOTE — Telephone Encounter (Signed)
Case was denied by the medical director, a peer to peer can be setup or the provider can call [phone] opt [phone] to discuss with the provider and possibly overturn the decision. Will send to provider to review.  Case# [id number] Member ID [id number] Dr Carlean Purl NPI [id number]

## 2021-09-17 NOTE — Telephone Encounter (Signed)
I spoke to the medical director and she wants more information from [place].  I have dictated a letter and signed it that Garrett [name] has and we can fax that as well.  Please fax the following:  Last office notes from 2022 and the 2 in 2021  EGD and colonoscopy reports and any pathology with these  All CBCs and ferritin tests over the last 2 years that is 2021 and 2022  B12 level from 2020 also

## 2021-09-25 ENCOUNTER — Telehealth: Payer: Self-pay | Admitting: Internal Medicine

## 2021-09-25 DIAGNOSIS — K5520 Angiodysplasia of colon without hemorrhage: Secondary | ICD-10-CM | POA: Insufficient documentation

## 2021-09-25 NOTE — Telephone Encounter (Signed)
Please call patient with capsule endoscopy results  Tell him that we found tiny areas of blood vessels on the surface of the intestine.  Most of these are out of reach of the scopes we have so I think continuing to take iron makes sense. One of them might be within reach of a scope and could be destroyed.  However it might not change things since there are others.  He should be sure to stay on his iron therapy his last iron test in the middle of this month was low again.  Please clarify that he is continuing to take ferrous sulfate once a day.  Also I would like to see him for an appointment about this you could use an 1130 or a 350 slot if needed.  I would like to see him in November.

## 2021-09-25 NOTE — Telephone Encounter (Signed)
Pt mom notified of pt results and recommendations from Dr. Carlean Purl. Pt was scheduled a Follow Up appointment on 10/07/2021 @ 3:50. Pt mom made aware; verbalized understanding with all questions answered.

## 2021-09-26 ENCOUNTER — Other Ambulatory Visit: Payer: Self-pay | Admitting: Family Medicine

## 2021-09-27 ENCOUNTER — Other Ambulatory Visit: Payer: Self-pay | Admitting: Family Medicine

## 2021-09-27 DIAGNOSIS — F3342 Major depressive disorder, recurrent, in full remission: Secondary | ICD-10-CM

## 2021-10-01 DIAGNOSIS — J3081 Allergic rhinitis due to animal (cat) (dog) hair and dander: Secondary | ICD-10-CM | POA: Diagnosis not present

## 2021-10-01 DIAGNOSIS — J3089 Other allergic rhinitis: Secondary | ICD-10-CM | POA: Diagnosis not present

## 2021-10-01 DIAGNOSIS — J301 Allergic rhinitis due to pollen: Secondary | ICD-10-CM | POA: Diagnosis not present

## 2021-10-07 ENCOUNTER — Encounter: Payer: Self-pay | Admitting: Internal Medicine

## 2021-10-07 ENCOUNTER — Ambulatory Visit (INDEPENDENT_AMBULATORY_CARE_PROVIDER_SITE_OTHER): Payer: BC Managed Care – PPO | Admitting: Internal Medicine

## 2021-10-07 VITALS — BP 134/80 | HR 84 | Ht 66.5 in | Wt 198.4 lb

## 2021-10-07 DIAGNOSIS — E559 Vitamin D deficiency, unspecified: Secondary | ICD-10-CM

## 2021-10-07 DIAGNOSIS — K5520 Angiodysplasia of colon without hemorrhage: Secondary | ICD-10-CM

## 2021-10-07 DIAGNOSIS — D500 Iron deficiency anemia secondary to blood loss (chronic): Secondary | ICD-10-CM | POA: Diagnosis not present

## 2021-10-07 DIAGNOSIS — K580 Irritable bowel syndrome with diarrhea: Secondary | ICD-10-CM

## 2021-10-07 NOTE — Patient Instructions (Signed)
We will be in touch about setting up a small bowel enteroscopy at the hospital.  I appreciate the opportunity to care for you. Silvano Rusk, MD, [facility]

## 2021-10-07 NOTE — Progress Notes (Signed)
Garrett [name]. 45 y.o. Apr 18, 1976 [id number]  Assessment & Plan:   Encounter Diagnoses  Name Primary?   Angiodysplasia of intestine Yes   Iron deficiency anemia due to chronic blood loss    Irritable bowel syndrome with diarrhea    Vitamin D deficiency     Working diagnosis is iron deficiency anemia due to chronic blood loss from angiodysplasia.  Question if there was a nutritional component as well.  Since he has an AVM within reach of a small bowel enteroscopy we will plan on scheduling that in early 2023, when we have hospital availability.  Risks benefits and indications explained to the patient and he understands and agrees to proceed.  Continue other medications for IBS and vitamin D deficiency and he has follow-up later this month with Dr. Yong Channel for physical and I will let him recheck labs.  CC: Marin Olp, MD  Subjective:   Chief Complaint: Iron deficiency anemia new diagnosis of AVMs  HPI Garrett [name] is here with his mom, he has had a longstanding issue with diarrhea that looks to be irritable bowel syndrome but because of diarrhea and iron deficiency anemia and unrevealing EGD and colonoscopy procedures we had him do a capsule endoscopy.  We attempted that this summer and the capsule did not leave the stomach.  That was a surprise as he did not have issues with gastroparesis that we know.  We reattempted this in late October using Reglan and we were able to confirm using the real-time imaging on the device that the capsule left the stomach.  He had an AVM at 29 minutes total in 26 minutes beyond the first duodenal image and very tiny possible AVMs further down.  4 of those.  Over time with iron supplementation he did finally normalize his ferritin and his hemoglobin has been normal.  His IBS-D remains stable but still an issue. No Known Allergies Current Meds  Medication Sig   Alpha-D-Galactosidase (BEANO PO) Take by mouth as needed.   azelastine (ASTELIN) 137  MCG/SPRAY nasal spray Place 1 spray into the nose 2 (two) times daily. Use in each nostril as directed   budesonide (RHINOCORT AQUA) 32 MCG/ACT nasal spray Place 1 spray into the nose daily.   buPROPion (WELLBUTRIN XL) 150 MG 24 hr tablet Take 1 tablet by mouth once daily   Calcium Carbonate Antacid (TUMS PO) Take by mouth as needed.   Cholecalciferol (VITAMIN D3) 25 MCG (1000 UT) CAPS Take 1 capsule by mouth daily.   cholestyramine light (PREVALITE) 4 GM/DOSE powder 2 g bid with meals   colestipol (COLESTID) 1 g tablet Take 2 tablets by mouth twice daily   cyanocobalamin 1000 MCG tablet Take 1,000 mcg by mouth daily.   desloratadine (CLARINEX) 5 MG tablet Take 5 mg by mouth daily.   EPINEPHrine 0.3 mg/0.3 mL IJ SOAJ injection Inject 0.3 mg into the muscle as needed.   escitalopram (LEXAPRO) 20 MG tablet Take 1 tablet by mouth once daily   ferrous sulfate 325 (65 FE) MG tablet Take 325 mg by mouth 2 (two) times a week.   Flavoring Agent (PEPPERMINT FLAVOR) OIL Take 1 capsule by mouth daily.   hyoscyamine (LEVSIN SL) 0.125 MG SL tablet Place 1 tablet (0.125 mg total) under the tongue every 4 (four) hours as needed.   lisinopril (ZESTRIL) 40 MG tablet Take 1 tablet by mouth once daily   Loperamide HCl (IMODIUM A-D PO) Take by mouth as needed.   NON FORMULARY Allergy shots  every other week   Omega-3 Fatty Acids (FISH OIL) 1200 MG CAPS Take 1 capsule by mouth daily.   omeprazole (PRILOSEC) 20 MG capsule TAKE 1 CAPSULE BY MOUTH ONCE DAILY BEFORE BREAKFAST   pravastatin (PRAVACHOL) 40 MG tablet Take 1 tablet by mouth once daily   Past Medical History:  Diagnosis Date   Acne    Allergy    Anxiety    Depression    External hemorrhoids    2004   GERD (gastroesophageal reflux disease)    HLD (hyperlipidemia)    Hypertension    Irritable bowel syndrome    Strabismus    Vitamin D deficiency 03/04/2021   Past Surgical History:  Procedure Laterality Date   COLONOSCOPY  10/16/12   FLEXIBLE  SIGMOIDOSCOPY     hemorrhoids-rule out internal bleeding   STRABISMUS SURGERY  2001, 2010   X 2   WISDOM TOOTH EXTRACTION     Social History   Social History Narrative   Single. Live with parents.       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      Hobbies: reading, time at mall, movies      Habits never smoker 2 caffeinated beverages daily no alcohol no drug use no other tobacco   family history includes Breast cancer in his mother; Hyperlipidemia in his father and mother; Hypertension in his father and mother; Prostate cancer in his father; Ulcerative colitis in his father.   Review of Systems As per HPI  Objective:   Physical Exam BP 134/80 (BP Location: Left Arm, Patient Position: Sitting, Cuff Size: Normal)   Pulse 84   Ht 5' 6.5" (1.689 m)   Wt 198 lb 6 oz (90 kg)   BMI 31.54 kg/m

## 2021-10-13 DIAGNOSIS — J3089 Other allergic rhinitis: Secondary | ICD-10-CM | POA: Diagnosis not present

## 2021-10-13 DIAGNOSIS — J3081 Allergic rhinitis due to animal (cat) (dog) hair and dander: Secondary | ICD-10-CM | POA: Diagnosis not present

## 2021-10-13 DIAGNOSIS — J301 Allergic rhinitis due to pollen: Secondary | ICD-10-CM | POA: Diagnosis not present

## 2021-10-20 ENCOUNTER — Encounter: Payer: BC Managed Care – PPO | Admitting: Family Medicine

## 2021-10-28 ENCOUNTER — Ambulatory Visit: Payer: BC Managed Care – PPO | Attending: Internal Medicine

## 2021-10-28 DIAGNOSIS — Z23 Encounter for immunization: Secondary | ICD-10-CM

## 2021-10-29 ENCOUNTER — Other Ambulatory Visit (HOSPITAL_BASED_OUTPATIENT_CLINIC_OR_DEPARTMENT_OTHER): Payer: Self-pay

## 2021-10-29 MED ORDER — PFIZER COVID-19 VAC BIVALENT 30 MCG/0.3ML IM SUSP
INTRAMUSCULAR | 0 refills | Status: DC
  Filled 2021-10-29: qty 0.3, 1d supply, fill #0

## 2021-11-02 ENCOUNTER — Other Ambulatory Visit: Payer: Self-pay | Admitting: Family Medicine

## 2021-11-03 ENCOUNTER — Other Ambulatory Visit: Payer: Self-pay

## 2021-11-03 ENCOUNTER — Encounter: Payer: Self-pay | Admitting: Family Medicine

## 2021-11-03 ENCOUNTER — Ambulatory Visit (INDEPENDENT_AMBULATORY_CARE_PROVIDER_SITE_OTHER): Payer: BC Managed Care – PPO | Admitting: Family Medicine

## 2021-11-03 VITALS — BP 128/78 | HR 78 | Temp 97.7°F | Ht 66.5 in | Wt 199.6 lb

## 2021-11-03 DIAGNOSIS — Z0000 Encounter for general adult medical examination without abnormal findings: Secondary | ICD-10-CM

## 2021-11-03 DIAGNOSIS — E785 Hyperlipidemia, unspecified: Secondary | ICD-10-CM

## 2021-11-03 DIAGNOSIS — F325 Major depressive disorder, single episode, in full remission: Secondary | ICD-10-CM

## 2021-11-03 DIAGNOSIS — E559 Vitamin D deficiency, unspecified: Secondary | ICD-10-CM

## 2021-11-03 DIAGNOSIS — E538 Deficiency of other specified B group vitamins: Secondary | ICD-10-CM

## 2021-11-03 DIAGNOSIS — I10 Essential (primary) hypertension: Secondary | ICD-10-CM

## 2021-11-03 NOTE — Patient Instructions (Addendum)
Please stop by lab before you go If you have mychart- we will send your results within 3 business days of [place] receiving them.  If you do not have mychart- we will call you about results within 5 business days of [place] receiving them.  *please also note that you will see labs on mychart as soon as they post. I will later go in and write notes on them- will say "notes from Dr. Yong Channel"  Your weight is up by 4 lbs - recommend trying to watch your portion sizes and track your calories through the "MyFitnessPal" app. Also encourage getting at least 150 minutes of regular exercise per week.   Recommended follow up: Return in about 6 months (around 05/04/2022) for follow up- or sooner if needed.

## 2021-11-03 NOTE — Progress Notes (Signed)
Phone: [phone]    Subjective:  Patient presents today for their annual physical. Chief complaint-noted.   See problem oriented charting- ROS- full  review of systems was completed and negative per full ROS Sheet- on phq9 mentions some trouble with sleep and appetite high  The following were reviewed and entered/updated in epic: Past Medical History:  Diagnosis Date   Acne    Allergy    Anxiety    Depression    External hemorrhoids    2004   GERD (gastroesophageal reflux disease)    HLD (hyperlipidemia)    Hypertension    Irritable bowel syndrome    Strabismus    Vitamin D deficiency 03/04/2021   Patient Active Problem List   Diagnosis Date Noted   Angiodysplasia of intestine 09/25/2021    Priority: Medium    Major depressive disorder with single episode, in full remission ([place]) 04/16/2021    Priority: Medium    Vitamin D deficiency 03/04/2021    Priority: Medium    Vitamin B12 deficiency 03/01/2020    Priority: Medium    Hypertension 02/19/2014    Priority: Medium    IBS (irritable bowel syndrome) diarrhea predominant 08/14/2012    Priority: Medium    Allergic rhinitis 02/21/2012    Priority: Medium    Hyperlipidemia 09/05/2007    Priority: Medium    Depression 05/25/2007    Priority: Medium    GERD (gastroesophageal reflux disease) 11/29/2012    Priority: Low   Past Surgical History:  Procedure Laterality Date   COLONOSCOPY  10/16/12   FLEXIBLE SIGMOIDOSCOPY     hemorrhoids-rule out internal bleeding   STRABISMUS SURGERY  2001, 2010   X 2   WISDOM TOOTH EXTRACTION      Family History  Problem Relation Age of Onset   Breast cancer Mother    Hypertension Mother    Hyperlipidemia Mother    Prostate cancer Father        late 75s   Ulcerative colitis Father    Hypertension Father    Hyperlipidemia Father    Colon cancer Neg Hx    Rectal cancer Neg Hx    Stomach cancer Neg Hx    Esophageal cancer Neg Hx     Medications- reviewed and  updated Current Outpatient Medications  Medication Sig Dispense Refill   Alpha-D-Galactosidase (BEANO PO) Take by mouth as needed.     azelastine (ASTELIN) 137 MCG/SPRAY nasal spray Place 1 spray into the nose 2 (two) times daily. Use in each nostril as directed     budesonide (RHINOCORT AQUA) 32 MCG/ACT nasal spray Place 1 spray into the nose daily.     buPROPion (WELLBUTRIN XL) 150 MG 24 hr tablet Take 1 tablet by mouth once daily 90 tablet 0   Calcium Carbonate Antacid (TUMS PO) Take by mouth as needed.     Cholecalciferol (VITAMIN D3) 25 MCG (1000 UT) CAPS Take 1 capsule by mouth daily.     cholestyramine light (PREVALITE) 4 GM/DOSE powder 2 g bid with meals 239.4 g 12   colestipol (COLESTID) 1 g tablet Take 2 tablets by mouth twice daily 240 tablet 0   cyanocobalamin 1000 MCG tablet Take 1,000 mcg by mouth daily.     desloratadine (CLARINEX) 5 MG tablet Take 5 mg by mouth daily.     EPINEPHrine 0.3 mg/0.3 mL IJ SOAJ injection Inject 0.3 mg into the muscle as needed.  0   escitalopram (LEXAPRO) 20 MG tablet Take 1 tablet by mouth once daily 90  tablet 0   ferrous sulfate 325 (65 FE) MG tablet Take 325 mg by mouth 2 (two) times a week.     Flavoring Agent (PEPPERMINT FLAVOR) OIL Take 1 capsule by mouth daily.     hyoscyamine (LEVSIN SL) 0.125 MG SL tablet Place 1 tablet (0.125 mg total) under the tongue every 4 (four) hours as needed. 60 tablet 5   lisinopril (ZESTRIL) 40 MG tablet Take 1 tablet by mouth once daily 90 tablet 0   Loperamide HCl (IMODIUM A-D PO) Take by mouth as needed.     NON FORMULARY Allergy shots every other week     Omega-3 Fatty Acids (FISH OIL) 1200 MG CAPS Take 1 capsule by mouth daily.     omeprazole (PRILOSEC) 20 MG capsule TAKE 1 CAPSULE BY MOUTH ONCE DAILY BEFORE BREAKFAST 90 capsule 0   pravastatin (PRAVACHOL) 40 MG tablet Take 1 tablet by mouth once daily 90 tablet 1   No current facility-administered medications for this visit.    Allergies-reviewed and  updated No Known Allergies  Social History   Social History Narrative   Single. Live with parents.       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      Hobbies: reading, time at mall, movies      Habits never smoker 2 caffeinated beverages daily no alcohol no drug use no other tobacco      Objective:  BP 128/78    Pulse 78    Temp 97.7 F (36.5 C)    Ht 5' 6.5" (1.689 m)    Wt 199 lb 9.6 oz (90.5 kg)    SpO2 98%    BMI 31.73 kg/m  Gen: NAD, resting comfortably HEENT: Mucous membranes are moist. Oropharynx normal Neck: no thyromegaly CV: RRR no murmurs rubs or gallops Lungs: CTAB no crackles, wheeze, rhonchi Abdomen: soft/nontender/nondistended/normal bowel sounds. No rebound or guarding.  Ext: no edema Skin: warm, dry Neuro: grossly normal, moves all extremities, PERRLA     Assessment and Plan:  45 y.o. male presenting for annual physical.  Health Maintenance counseling: 1. Anticipatory guidance: Patient counseled regarding regular dental exams -q6 months, eye exams -every 2 years, avoiding smoking and second hand smoke, limiting alcohol to 2 beverages per day-well under that, no illicit drugs.   2. Risk factor reduction:  Advised patient of need for regular exercise and diet rich and fruits and vegetables to reduce risk of heart attack and stroke.  Exercise- trying to do some walking 2-3x a week for half an hour to hour and a half - goal - dropped off lately and could contribute to poor sleep- harder in winter for him .  Diet/weight management-last year felt could make some improvement but weight trending up some 4 pounds-  wants to work on portion [employer] Readings from Last 3 Encounters:  11/03/21 199 lb 9.6 oz (90.5 kg)  10/07/21 198 lb 6 oz (90 kg)  08/19/21 198 lb 4 oz (89.9 kg)  3. Immunizations/screenings/ancillary studies- discuss Pneumococcal vaccine-I do not see a strong indication  for him- otherwise immunizations up-to-date. Immunization History  Administered Date(s) Administered   Influenza Split 09/11/2012   Influenza Whole 08/22/2005, 09/04/2007, 08/03/2010, 08/26/2020   Influenza, High Dose Seasonal PF 08/29/2017   Influenza,inj,Quad PF,6+ Mos 08/09/2013, 09/08/2016, 07/19/2017, 07/31/2018, 07/31/2019   Influenza-Unspecified 09/05/2014, 08/21/2015   PFIZER(Purple Top)SARS-COV-2 Vaccination 12/06/2019, 12/25/2019, 04/08/2020, 10/13/2020   Pfizer  Covid-19 Vaccine Bivalent Booster 60yrs & up 10/28/2021   Td 01/29/2010   Tdap 03/12/2008, 02/29/2020  4. Prostate cancer screening- dad with pprostate cancer in 82s, will start screening age 29 for him  5. Colon cancer screening - last colonoscopy 07/25/2020 with a 10-year repeat recommended 6. Skin cancer screening/prevention- sees dermatology every few years. Advised regular sunscreen use. Denies worrisome, changing, or new skin lesions.  7. Testicular cancer screening- advised monthly self exams  8. STD screening- patient opts out- advised to always use protection and he has not had any unprotected sex 77. Smoking associated screening- never smoker  Status of chronic or acute concerns   #hypertension S: medication: Lisinopril 40 mg daily BP Readings from Last 3 Encounters:  11/03/21 128/78  10/07/21 134/80  08/19/21 118/70  A/P: Controlled. Continue current medications.   #hyperlipidemia S: Medication:Pravastatin 40 mg daily, fish oil 1200 mg daily  Lab Results  Component Value Date   CHOL 162 09/02/2020   HDL 43 09/02/2020   LDLCALC 86 09/02/2020   LDLDIRECT 87.0 07/31/2019   TRIG 249 (H) 09/02/2020   CHOLHDL 3.8 09/02/2020  A/P: Poor control-could push her LDL under 70 but prefer to do this with lifestyle changes but unfortunately weight is trending up-this should also help triglycerides-update lipid panel today- triglycerides if over 400 would preclude using nonfasting labs in future  # Depression S:  Medication:Wellbutrin 150 mg daily and Lexapro 20 mg daily. Some mild sleep issues . No anhedonia or depressed mood- has not done well with tweaks in past A/P: Full remission-continue current medication  # GERD- also follows with Dr. Carlean Purl S:Medication: Omeprazole 20 mg  A/P: Controlled. Continue current medications.    # B12 deficiency (potentially related to PPI use)/iron deficiency/now with vitamin D deficiency S: Current treatment/medication (oral vs. IM): cyanocobalamin 1000 mcg daily  Lab Results  Component Value Date   VITAMINB12 1,259 (H) 02/23/2021  A/P: B12 control last check-still on supplements - recheck today  #Vitamin D deficiency S: Medication: Vitamin D 1000 units daily Last vitamin D Lab Results  Component Value Date   VD25OH 32.62 08/19/2021  A/P: Well-controlled recently-continue to monitor- he requests today  #IBS- patient continues peppermint oil, colestipol (cholestyramine only if colestipol is out),  hyoscyamine- rare use, occasional Imodium. Reasonably stable- not improving or worsening  #Angiodysplasia of Intestine - patient follows with Dr. Carlean Purl and on 10/07/2021 visit they will plan to have a small bowl enteroscopy done at the hospital- to cauterize the areas  #allergist has him on clarinex and has updated epi pen   Recommended follow up: Return in about 6 months (around 05/04/2022) for follow up- or sooner if needed.  Lab/Order associations:NOT fasting   ICD-10-CM   1. Preventative health care  Z00.00 CBC with Differential/Platelet    Comprehensive metabolic panel    Lipid panel    Vitamin B12    VITAMIN D 25 Hydroxy (Vit-D Deficiency, Fractures)    2. Primary hypertension  I10     3. Hyperlipidemia, unspecified hyperlipidemia type  E78.5 CBC with Differential/Platelet    Comprehensive metabolic panel    Lipid panel    4. Major depressive disorder with single episode, in full remission ([place])  F32.5     5. Vitamin B12 deficiency  E53.8  Vitamin B12    6. Vitamin D deficiency  E55.9 VITAMIN D 25 Hydroxy (Vit-D Deficiency, Fractures)      No orders of the defined types were placed in this encounter.  I,Harris Phan,acting as  a scribe for [name], MD.,have documented all relevant documentation on the behalf of [name], MD,as directed by  [name], MD while in the presence of [name], MD.  I, [name], MD, have reviewed all documentation for this visit. The documentation on 11/03/21 for the exam, diagnosis, procedures, and orders are all accurate and complete.  Return precautions advised.   [name], MD

## 2021-11-04 ENCOUNTER — Other Ambulatory Visit: Payer: Self-pay | Admitting: Family Medicine

## 2021-11-04 LAB — COMPREHENSIVE METABOLIC PANEL
ALT: 20 U/L (ref 0–53)
AST: 16 U/L (ref 0–37)
Albumin: 4.9 g/dL (ref 3.5–5.2)
Alkaline Phosphatase: 58 U/L (ref 39–117)
BUN: 9 mg/dL (ref 6–23)
CO2: 27 mEq/L (ref 19–32)
Calcium: 9.9 mg/dL (ref 8.4–10.5)
Chloride: 100 mEq/L (ref 96–112)
Creatinine, Ser: 0.8 mg/dL (ref 0.40–1.50)
GFR: 106.57 mL/min (ref >60.00)
Glucose, Bld: 77 mg/dL (ref 70–99)
Potassium: 4.2 mEq/L (ref 3.5–5.1)
Sodium: 134 mEq/L — ABNORMAL LOW (ref 135–145)
Total Bilirubin: 0.7 mg/dL (ref 0.2–1.2)
Total Protein: 7.5 g/dL (ref 6.0–8.3)

## 2021-11-04 LAB — LIPID PANEL
Cholesterol: 158 mg/dL (ref 0–200)
HDL: 43.4 mg/dL (ref >39.00)
NonHDL: 114.71
Total CHOL/HDL Ratio: 4
Triglycerides: 316 mg/dL — ABNORMAL HIGH (ref 0.0–149.0)
VLDL: 63.2 mg/dL — ABNORMAL HIGH (ref 0.0–40.0)

## 2021-11-04 LAB — VITAMIN D 25 HYDROXY (VIT D DEFICIENCY, FRACTURES): VITD: 24.86 ng/mL — ABNORMAL LOW (ref 30.00–100.00)

## 2021-11-04 LAB — CBC WITH DIFFERENTIAL/PLATELET
Basophils Absolute: 0 10*3/uL (ref 0.0–0.1)
Basophils Relative: 0.6 % (ref 0.0–3.0)
Eosinophils Absolute: 0.1 10*3/uL (ref 0.0–0.7)
Eosinophils Relative: 1.9 % (ref 0.0–5.0)
HCT: 39.3 % (ref 39.0–52.0)
Hemoglobin: 13.2 g/dL (ref 13.0–17.0)
Lymphocytes Relative: 28.2 % (ref 12.0–46.0)
Lymphs Abs: 1.5 10*3/uL (ref 0.7–4.0)
MCHC: 33.7 g/dL (ref 30.0–36.0)
MCV: 84 fl (ref 78.0–100.0)
Monocytes Absolute: 0.4 10*3/uL (ref 0.1–1.0)
Monocytes Relative: 8 % (ref 3.0–12.0)
Neutro Abs: 3.2 10*3/uL (ref 1.4–7.7)
Neutrophils Relative %: 61.3 % (ref 43.0–77.0)
Platelets: 246 10*3/uL (ref 150.0–400.0)
RBC: 4.68 Mil/uL (ref 4.22–5.81)
RDW: 13.1 % (ref 11.5–15.5)
WBC: 5.2 10*3/uL (ref 4.0–10.5)

## 2021-11-04 LAB — LDL CHOLESTEROL, DIRECT: Direct LDL: 92 mg/dL

## 2021-11-04 LAB — VITAMIN B12: Vitamin B-12: 1085 pg/mL — ABNORMAL HIGH (ref 211–911)

## 2021-11-04 MED ORDER — CHOLECALCIFEROL 1.25 MG (50000 UT) PO TABS: ORAL_TABLET | ORAL | 1 refills | Status: DC

## 2021-11-06 DIAGNOSIS — J3089 Other allergic rhinitis: Secondary | ICD-10-CM | POA: Diagnosis not present

## 2021-11-06 DIAGNOSIS — J301 Allergic rhinitis due to pollen: Secondary | ICD-10-CM | POA: Diagnosis not present

## 2021-11-06 DIAGNOSIS — J3081 Allergic rhinitis due to animal (cat) (dog) hair and dander: Secondary | ICD-10-CM | POA: Diagnosis not present

## 2021-11-09 ENCOUNTER — Other Ambulatory Visit: Payer: Self-pay | Admitting: Family Medicine

## 2021-11-17 DIAGNOSIS — J3081 Allergic rhinitis due to animal (cat) (dog) hair and dander: Secondary | ICD-10-CM | POA: Diagnosis not present

## 2021-11-17 DIAGNOSIS — J3089 Other allergic rhinitis: Secondary | ICD-10-CM | POA: Diagnosis not present

## 2021-11-17 DIAGNOSIS — J301 Allergic rhinitis due to pollen: Secondary | ICD-10-CM | POA: Diagnosis not present

## 2021-11-26 ENCOUNTER — Telehealth: Payer: Self-pay

## 2021-11-26 ENCOUNTER — Other Ambulatory Visit: Payer: Self-pay

## 2021-11-26 DIAGNOSIS — D500 Iron deficiency anemia secondary to blood loss (chronic): Secondary | ICD-10-CM

## 2021-11-26 DIAGNOSIS — K5520 Angiodysplasia of colon without hemorrhage: Secondary | ICD-10-CM

## 2021-11-26 NOTE — Telephone Encounter (Signed)
Left message for pt to call back  °

## 2021-11-26 NOTE — Telephone Encounter (Signed)
-----   Message from Gatha Mayer, MD sent at 11/26/2021  8:36 AM EST ----- Regarding: ? procedure 1/9 Please see if he can do his enteroscopy test at 0730 1/9  Thanks  CEG

## 2021-11-26 NOTE — Telephone Encounter (Signed)
Pt stated that he could make the appointment 11/30/2021 at  7:30 at [facility] for the Enteroscopy: Enteroscopy ordered and scheduled for 11/30/2021 at 7:30 AM  at [facility] long. Pt to arrive at 6:00 AM Pt made aware.  Case ID # [id number] Prep Instructions were created and sent to pt via my Chart. Pt made aware Ambulatory Referral To GI placed in Epic Pt verbalized understanding with all questions answered.

## 2021-11-29 ENCOUNTER — Encounter (HOSPITAL_COMMUNITY): Payer: Self-pay | Admitting: Internal Medicine

## 2021-11-29 NOTE — Anesthesia Preprocedure Evaluation (Addendum)
Anesthesia Evaluation  Patient identified by MRN, date of birth, ID band Patient awake    Reviewed: Allergy & Precautions, NPO status , Patient's Chart, lab work & pertinent test results, reviewed documented beta blocker date and time   Airway Mallampati: III  TM Distance: >3 FB Neck ROM: Full    Dental  (+) Teeth Intact, Dental Advisory Given, Caps   Pulmonary neg pulmonary ROS,    Pulmonary exam normal breath sounds clear to auscultation       Cardiovascular hypertension, Pt. on medications Normal cardiovascular exam Rhythm:Regular Rate:Normal     Neuro/Psych PSYCHIATRIC DISORDERS Anxiety Depression    GI/Hepatic Neg liver ROS, GERD  Medicated,Angiodysplasia of intestines   Endo/Other  Obesity Hyperlipidemia  Renal/GU negative Renal ROS  negative genitourinary   Musculoskeletal negative musculoskeletal ROS (+)   Abdominal (+) + obese,   Peds  Hematology  (+) anemia ,   Anesthesia Other Findings   Reproductive/Obstetrics                            Anesthesia Physical Anesthesia Plan  ASA: 2  Anesthesia Plan: MAC   Post-op Pain Management: Minimal or no pain anticipated   Induction: Intravenous  PONV Risk Score and Plan: 1 and Propofol infusion and Treatment may vary due to age or medical condition  Airway Management Planned: Natural Airway and Simple Face Mask  Additional Equipment:   Intra-op Plan:   Post-operative Plan:   Informed Consent: I have reviewed the patients History and Physical, chart, labs and discussed the procedure including the risks, benefits and alternatives for the proposed anesthesia with the patient or authorized representative who has indicated his/her understanding and acceptance.     Dental advisory given  Plan Discussed with: CRNA and Anesthesiologist  Anesthesia Plan Comments:        Anesthesia Quick Evaluation

## 2021-11-30 ENCOUNTER — Ambulatory Visit (HOSPITAL_COMMUNITY): Payer: BC Managed Care – PPO | Admitting: Certified Registered Nurse Anesthetist

## 2021-11-30 ENCOUNTER — Encounter (HOSPITAL_COMMUNITY): Payer: Self-pay | Admitting: Internal Medicine

## 2021-11-30 ENCOUNTER — Encounter (HOSPITAL_COMMUNITY)
Admission: RE | Disposition: A | Discharge status: Home / Self Care | Payer: Self-pay | Source: Home / Self Care | Attending: Internal Medicine

## 2021-11-30 ENCOUNTER — Other Ambulatory Visit: Payer: Self-pay | Admitting: Family Medicine

## 2021-11-30 ENCOUNTER — Other Ambulatory Visit: Payer: Self-pay

## 2021-11-30 ENCOUNTER — Ambulatory Visit (HOSPITAL_COMMUNITY)
Admission: RE | Admit: 2021-11-30 | Discharge: 2021-11-30 | Disposition: A | Discharge status: Home / Self Care | Payer: BC Managed Care – PPO | Attending: Internal Medicine | Admitting: Internal Medicine

## 2021-11-30 DIAGNOSIS — E785 Hyperlipidemia, unspecified: Secondary | ICD-10-CM | POA: Diagnosis not present

## 2021-11-30 DIAGNOSIS — E669 Obesity, unspecified: Secondary | ICD-10-CM | POA: Insufficient documentation

## 2021-11-30 DIAGNOSIS — K5520 Angiodysplasia of colon without hemorrhage: Secondary | ICD-10-CM | POA: Insufficient documentation

## 2021-11-30 DIAGNOSIS — K317 Polyp of stomach and duodenum: Secondary | ICD-10-CM

## 2021-11-30 DIAGNOSIS — Z6830 Body mass index (BMI) 30.0-30.9, adult: Secondary | ICD-10-CM | POA: Insufficient documentation

## 2021-11-30 DIAGNOSIS — K449 Diaphragmatic hernia without obstruction or gangrene: Secondary | ICD-10-CM | POA: Diagnosis not present

## 2021-11-30 DIAGNOSIS — Q2739 Arteriovenous malformation, other site: Secondary | ICD-10-CM | POA: Insufficient documentation

## 2021-11-30 DIAGNOSIS — K31819 Angiodysplasia of stomach and duodenum without bleeding: Secondary | ICD-10-CM | POA: Diagnosis not present

## 2021-11-30 DIAGNOSIS — F418 Other specified anxiety disorders: Secondary | ICD-10-CM | POA: Insufficient documentation

## 2021-11-30 DIAGNOSIS — K219 Gastro-esophageal reflux disease without esophagitis: Secondary | ICD-10-CM | POA: Diagnosis not present

## 2021-11-30 DIAGNOSIS — D649 Anemia, unspecified: Secondary | ICD-10-CM | POA: Diagnosis not present

## 2021-11-30 DIAGNOSIS — I10 Essential (primary) hypertension: Secondary | ICD-10-CM | POA: Insufficient documentation

## 2021-11-30 DIAGNOSIS — D500 Iron deficiency anemia secondary to blood loss (chronic): Secondary | ICD-10-CM | POA: Insufficient documentation

## 2021-11-30 HISTORY — PX: ENTEROSCOPY: SHX5533

## 2021-11-30 HISTORY — PX: SUBMUCOSAL TATTOO INJECTION: SHX6856

## 2021-11-30 SURGERY — ENTEROSCOPY
Anesthesia: Monitor Anesthesia Care

## 2021-11-30 MED ORDER — PHENYLEPHRINE HCL (PRESSORS) 10 MG/ML IV SOLN
INTRAVENOUS | Status: AC
  Filled 2021-11-30: qty 1

## 2021-11-30 MED ORDER — LACTATED RINGERS IV SOLN
INTRAVENOUS | Status: AC | PRN
  Administered 2021-11-30: 1000 mL via INTRAVENOUS

## 2021-11-30 MED ORDER — SODIUM CHLORIDE 0.9 % IV SOLN: INTRAVENOUS | Status: DC

## 2021-11-30 MED ORDER — SPOT INK MARKER SYRINGE KIT
PACK | SUBMUCOSAL | Status: AC
  Filled 2021-11-30: qty 5

## 2021-11-30 MED ORDER — PROPOFOL 10 MG/ML IV BOLUS
INTRAVENOUS | Status: DC | PRN
  Administered 2021-11-30 (×3): 30 mg via INTRAVENOUS

## 2021-11-30 MED ORDER — PROPOFOL 500 MG/50ML IV EMUL
INTRAVENOUS | Status: DC | PRN
  Administered 2021-11-30: 125 ug/kg/min via INTRAVENOUS

## 2021-11-30 MED ORDER — PROPOFOL 1000 MG/100ML IV EMUL
INTRAVENOUS | Status: AC
  Filled 2021-11-30: qty 100

## 2021-11-30 MED ORDER — SPOT INK MARKER SYRINGE KIT
PACK | SUBMUCOSAL | Status: DC | PRN
  Administered 2021-11-30: 2 mL via SUBMUCOSAL

## 2021-11-30 MED ORDER — LIDOCAINE 2% (20 MG/ML) 5 ML SYRINGE
INTRAMUSCULAR | Status: DC | PRN
  Administered 2021-11-30: 100 mg via INTRAVENOUS

## 2021-11-30 NOTE — Anesthesia Procedure Notes (Signed)
Procedure Name: MAC Date/Time: 11/30/2021 7:41 AM Performed by: West Pugh, CRNA Pre-anesthesia Checklist: Patient identified, Emergency Drugs available, Suction available, Patient being monitored and Timeout performed Patient Re-evaluated:Patient Re-evaluated prior to induction Oxygen Delivery Method: Simple face mask Preoxygenation: Pre-oxygenation with 100% oxygen Induction Type: IV induction Placement Confirmation: positive ETCO2 Dental Injury: Teeth and Oropharynx as per pre-operative assessment

## 2021-11-30 NOTE — Discharge Instructions (Addendum)
° ° °  I did not find the AVM lesion that we were looking for.  It could be that it was hiding or it could have resolved, or perhaps I could not get the scope far enough down to reach it.  I did tattoo the area of the furthest extent of the procedure for future reference, should it be needed.  I did see a few tiny innocent stomach polyps that are not of clinical concern.  At this point I think it makes sense to continue your iron therapy and follow-up with Dr. Yong Channel regarding the anemia.  I would not push for further testing at this time.  I appreciate the opportunity to care for you. Gatha Mayer, MD, FACG   YOU HAD AN ENDOSCOPIC PROCEDURE TODAY: Refer to the procedure report and other information in the discharge instructions given to you for any specific questions about what was found during the examination. If this information does not answer your questions, please call [place] office at [phone] to clarify.   YOU SHOULD EXPECT: Some feelings of bloating in the abdomen. Passage of more gas than usual. Walking can help get rid of the air that was put into your GI tract during the procedure and reduce the bloating. If you had a lower endoscopy (such as a colonoscopy or flexible sigmoidoscopy) you may notice spotting of blood in your stool or on the toilet paper. Some abdominal soreness may be present for a day or two, also.  DIET: Your first meal following the procedure should be a light meal and then it is ok to progress to your normal diet. A half-sandwich or bowl of soup is an example of a good first meal. Heavy or fried foods are harder to digest and may make you feel nauseous or bloated. Drink plenty of fluids but you should avoid alcoholic beverages for 24 hours. If you had a esophageal dilation, please see attached instructions for diet.    ACTIVITY: Your care partner should take you home directly after the procedure. You should plan to take it easy, moving slowly for the rest of  the day. You can resume normal activity the day after the procedure however YOU SHOULD NOT DRIVE, use power tools, machinery or perform tasks that involve climbing or major physical exertion for 24 hours (because of the sedation medicines used during the test).   SYMPTOMS TO REPORT IMMEDIATELY: A gastroenterologist can be reached at any hour. Please call [phone]  for any of the following symptoms:  Following upper endoscopy (EGD, EUS, ERCP, esophageal dilation) Vomiting of blood or coffee ground material  New, significant abdominal pain  New, significant chest pain or pain under the shoulder blades  Painful or persistently difficult swallowing  New shortness of breath  Black, tarry-looking or red, bloody stools  FOLLOW UP:  If any biopsies were taken you will be contacted by phone or by letter within the next 1-3 weeks. Call [phone]  if you have not heard about the biopsies in 3 weeks.  Please also call with any specific questions about appointments or follow up tests.

## 2021-11-30 NOTE — Op Note (Signed)
[facility] Patient Name: Garrett [name] Procedure Date: 11/30/2021 MRN: [id number] Attending MD: Gatha Mayer , MD Date of Birth: 1976/07/03 CSN: [id number] Age: 46 Admit Type: Inpatient Procedure:                Small bowel enteroscopy Indications:              Iron deficiency anemia secondary to chronic blood                            loss, Arteriovenous malformation in the small                            intestine Providers:                Gatha Mayer, MD, Carmie End, RN, Benetta Spar, Technician Referring MD:              Medicines:                Propofol per Anesthesia, Monitored Anesthesia Care Complications:            No immediate complications. Estimated Blood Loss:     Estimated blood loss: none. Procedure:                Pre-Anesthesia Assessment:                           - Prior to the procedure, a History and Physical                            was performed, and patient medications and                            allergies were reviewed. The patient's tolerance of                            previous anesthesia was also reviewed. The risks                            and benefits of the procedure and the sedation                            options and risks were discussed with the patient.                            All questions were answered, and informed consent                            was obtained. Prior Anticoagulants: The patient has                            taken no previous anticoagulant or antiplatelet  agents. ASA Grade Assessment: II - A patient with                            mild systemic disease. After reviewing the risks                            and benefits, the patient was deemed in                            satisfactory condition to undergo the procedure.                           After obtaining informed consent, the endoscope was                            passed  under direct vision. Throughout the                            procedure, the patient's blood pressure, pulse, and                            oxygen saturations were monitored continuously. The                            [id number]-Q180 ([id number]) Olympus enteroscope was                            introduced through the mouth and advanced to the                            proximal jejunum. The small bowel enteroscopy was                            accomplished without difficulty. The patient                            tolerated the procedure well. Scope In: Scope Out: Findings:      A few diminutive sessile polyps were found in the gastric fundus and in       the gastric body.      A small hiatal hernia was present.      There was no evidence of significant pathology in the proximal jejunum.       Area was tattooed with an injection of 2 mL of Spot (carbon black) at       the most distal point of the exam.. Estimated blood loss: none.      There was no evidence of significant pathology in the entire examined       duodenum.      The examined esophagus was normal. Impression:               - A few gastric polyps. Classic appearance of                            benign fundic gland polyps. No signs of bleeding.                           -  Small hiatal hernia.                           - The examined portion of the jejunum was normal.                            Tattooed distal extent of exam. AVM's seen at                            capsule endoscopy (main one was 2c mins after bulb,                            others much more distal) not seen today.                           - Normal examined duodenum.                           - Normal esophagus.                           - No specimens collected. Recommendation:           - Patient has a contact number available for                            emergencies. The signs and symptoms of potential                            delayed complications  were discussed with the                            patient. Return to normal activities tomorrow.                            Written discharge instructions were provided to the                            patient.                           - Resume previous diet.                           - Would remain on iron therapy. Taking oral FeSO4                            2x/week                           Would not repeat procedures unless cliincal course                            suggests further need. Would have preferred to ID  and ablate AVM that was seen on capsule endoscopy                            but he has normalized ferritin and Hgb w/ ferrous                            sulfate so continue that. F/U PCP re: anemia. Procedure Code(s):        --- Professional ---                           [phone], Small intestinal endoscopy, enteroscopy                            beyond second portion of duodenum, not including                            ileum; diagnostic, including collection of                            specimen(s) by brushing or washing, when performed                            (separate procedure)                           44799, Unlisted procedure, small intestine Diagnosis Code(s):        --- Professional ---                           K31.7, Polyp of stomach and duodenum                           K44.9, Diaphragmatic hernia without obstruction or                            gangrene                           D50.0, Iron deficiency anemia secondary to blood                            loss (chronic)                           K31.819, Angiodysplasia of stomach and duodenum                            without bleeding CPT copyright 2019 [employer] Association. All rights reserved. The codes documented in this report are preliminary and upon coder review may  be revised to meet current compliance requirements. Gatha Mayer, MD 11/30/2021 8:23:02  AM This report has been signed electronically. Number of Addenda: 0

## 2021-11-30 NOTE — Anesthesia Postprocedure Evaluation (Signed)
Anesthesia Post Note  Patient: Garrett [name].  Procedure(s) Performed: ENTEROSCOPY SUBMUCOSAL TATTOO INJECTION     Patient location during evaluation: PACU Anesthesia Type: MAC Level of consciousness: awake and alert and oriented Pain management: pain level controlled Vital Signs Assessment: post-procedure vital signs reviewed and stable Respiratory status: spontaneous breathing, nonlabored ventilation and respiratory function stable Cardiovascular status: stable and blood pressure returned to baseline Postop Assessment: no apparent nausea or vomiting Anesthetic complications: no   No notable events documented.  Last Vitals:  Vitals:   11/30/21 0820 11/30/21 0830  BP: 120/64 133/84  Pulse: 84 81  Resp: 15 20  Temp:    SpO2: 100% 96%    Last Pain:  Vitals:   11/30/21 0817  TempSrc: Oral  PainSc: 0-No pain                 Caine Barfield A.

## 2021-11-30 NOTE — Transfer of Care (Signed)
Immediate Anesthesia Transfer of Care Note  Patient: Garrett [name].  Procedure(s) Performed: ENTEROSCOPY SUBMUCOSAL TATTOO INJECTION  Patient Location: PACU and Endoscopy Unit  Anesthesia Type:MAC  Level of Consciousness: awake, alert , oriented and patient cooperative  Airway & Oxygen Therapy: Patient Spontanous Breathing and Patient connected to face mask oxygen  Post-op Assessment: Report given to RN and Post -op Vital signs reviewed and stable  Post vital signs: Reviewed and stable  Last Vitals:  Vitals Value Taken Time  BP    Temp    Pulse 91 11/30/21 0816  Resp 13 11/30/21 0816  SpO2 100 % 11/30/21 0816  Vitals shown include unvalidated device data.  Last Pain:  Vitals:   11/30/21 0724  TempSrc: Oral  PainSc: 0-No pain         Complications: No notable events documented.

## 2021-11-30 NOTE — H&P (Signed)
[facility] History and Physical   Primary Care Physician:  Marin Olp, MD   Reason for Procedure:   Evaluate and treat AVM's  Plan:    Enteroscopy and ablation of AVM's     HPI: Garrett [name]. is a 46 y.o. male here for treatment of small bowel AVM's seen on capsule endoscopy in setting of iron deficiency anemia   Past Medical History:  Diagnosis Date   Acne    Allergy    Anxiety    Depression    External hemorrhoids    2004   GERD (gastroesophageal reflux disease)    HLD (hyperlipidemia)    Hypertension    Irritable bowel syndrome    Strabismus    Vitamin D deficiency 03/04/2021    Past Surgical History:  Procedure Laterality Date   COLONOSCOPY  10/16/2012   ESOPHAGOGASTRODUODENOSCOPY  2021   FLEXIBLE SIGMOIDOSCOPY     hemorrhoids-rule out internal bleeding   STRABISMUS SURGERY  2001, 2010   X 2   WISDOM TOOTH EXTRACTION      Prior to Admission medications   Medication Sig Start Date End Date Taking? Authorizing Provider  Alpha-D-Galactosidase (BEANO PO) Take 1 tablet by mouth daily as needed (gas).   Yes [provider]  azelastine (ASTELIN) 137 MCG/SPRAY nasal spray Place 1 spray into both nostrils 2 (two) times daily. Use in each nostril as directed   Yes [provider]  budesonide (RHINOCORT AQUA) 32 MCG/ACT nasal spray Place 1 spray into both nostrils daily.   Yes [provider]  buPROPion (WELLBUTRIN XL) 150 MG 24 hr tablet Take 1 tablet by mouth once daily 09/07/21  Yes Marin Olp, MD  Calcium Carbonate Antacid (TUMS PO) Take 2 tablets by mouth daily as needed (intergestion).   Yes [provider]  colestipol (COLESTID) 1 g tablet Take 2 tablets by mouth twice daily 11/09/21  Yes Vivi Barrack, MD  cyanocobalamin 1000 MCG tablet Take 1,000 mcg by mouth daily.   Yes [provider]  desloratadine (CLARINEX) 5 MG tablet Take 5 mg by mouth daily.   Yes [provider]   escitalopram (LEXAPRO) 20 MG tablet Take 1 tablet by mouth once daily 09/28/21  Yes Marin Olp, MD  ferrous sulfate 325 (65 FE) MG tablet Take 325 mg by mouth 2 (two) times a week.   Yes [provider]  hyoscyamine (LEVSIN SL) 0.125 MG SL tablet Place 1 tablet (0.125 mg total) under the tongue every 4 (four) hours as needed. Patient taking differently: Place 0.125 mg under the tongue every 4 (four) hours as needed for cramping. 11/02/16  Yes Marin Olp, MD  lisinopril (ZESTRIL) 40 MG tablet Take 1 tablet by mouth once daily 11/02/21  Yes Marin Olp, MD  Loperamide HCl (IMODIUM A-D PO) Take 1 capsule by mouth as needed (Diarrhea).   Yes [provider]  NON FORMULARY Allergy shots every other week   Yes [provider]  Omega-3 Fatty Acids (FISH OIL) 1200 MG CAPS Take 1,200 mg by mouth daily.   Yes [provider]  omeprazole (PRILOSEC) 20 MG capsule TAKE 1 CAPSULE BY MOUTH ONCE DAILY BEFORE BREAKFAST 11/02/21  Yes Marin Olp, MD  pravastatin (PRAVACHOL) 40 MG tablet Take 1 tablet by mouth once daily 06/15/21  Yes Marin Olp, MD  shark liver oil-cocoa butter (PREPARATION H) 0.25-3-85.5 % suppository Place 1 suppository rectally at bedtime.   Yes [provider]  Cholecalciferol  1.25 MG (50000 UT) TABS 50,000 units PO qwk for 12 weeks. 11/04/21   Marin Olp, MD  cholestyramine light (PREVALITE) 4 GM/DOSE powder 2 g bid with meals Patient not taking: Reported on 11/26/2021 02/11/20   Gatha Mayer, MD  EPINEPHrine 0.3 mg/0.3 mL IJ SOAJ injection Inject 0.3 mg into the muscle as needed for anaphylaxis. 11/25/17   [provider]    Current Facility-Administered Medications  Medication Dose Route Frequency Provider Last Rate Last Admin   0.9 %  sodium chloride infusion   Intravenous Continuous Gatha Mayer, MD       lactated ringers infusion    Continuous PRN Gatha Mayer, MD 125 mL/hr at 11/30/21 0733  1,000 mL at 11/30/21 0733    Allergies as of 11/26/2021   (No Known Allergies)    Family History  Problem Relation Age of Onset   Breast cancer Mother    Hypertension Mother    Hyperlipidemia Mother    Prostate cancer Father        late 53s   Ulcerative colitis Father    Hypertension Father    Hyperlipidemia Father    Colon cancer Neg Hx    Rectal cancer Neg Hx    Stomach cancer Neg Hx    Esophageal cancer Neg Hx     Social History   Socioeconomic History   Marital status: Single    Spouse name: Not on file   Number of children: 0   Years of education: Not on file   Highest education level: Not on file  Occupational History   Occupation: day trader  Tobacco Use   Smoking status: Never   Smokeless tobacco: Never  Vaping Use   Vaping Use: Never used  Substance and Sexual Activity   Alcohol use: Yes    Alcohol/week: 0.0 standard drinks    Comment: rare 1-2x a month   Drug use: No   Sexual activity: Not on file  Other Topics Concern   Not on file  Social History Narrative   Single. Live with parents.       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      Hobbies: reading, time at mall, movies      Habits never smoker 2 caffeinated beverages daily no alcohol no drug use no other tobacco   Social Determinants of Radio broadcast assistant Strain: Not on file  Food Insecurity: Not on file  Transportation Needs: Not on file  Physical Activity: Not on file  Stress: Not on file  Social Connections: Not on file  Intimate Partner Violence: Not on file    Review of Systems:  All other review of systems negative except as mentioned in the HPI.  Physical Exam: Vital signs BP (!) 158/90    Pulse 73    Temp 98.8 F (37.1 C) (Oral)    Resp 17    Ht 5\' 7"  (1.702 m)    Wt 88.5 kg    SpO2 97%    BMI 30.54 kg/m   General:   Alert,  Well-developed, well-nourished, pleasant and  cooperative in NAD Lungs:  Clear throughout to auscultation.   Heart:  Regular rate and rhythm; no murmurs, clicks, rubs,  or gallops. Abdomen:  Soft, nontender and nondistended. Normal bowel sounds.   Neuro/Psych:  Alert and cooperative. Normal mood and affect. A and O x 3   @Christelle Igoe   Simonne Maffucci, MD, [facility] [phone] (pager) 11/30/2021 7:36 AM@

## 2021-12-01 ENCOUNTER — Encounter (HOSPITAL_COMMUNITY): Payer: Self-pay | Admitting: Internal Medicine

## 2021-12-01 ENCOUNTER — Encounter: Payer: Self-pay | Admitting: Family Medicine

## 2021-12-02 ENCOUNTER — Other Ambulatory Visit: Payer: Self-pay

## 2021-12-02 DIAGNOSIS — J3089 Other allergic rhinitis: Secondary | ICD-10-CM | POA: Diagnosis not present

## 2021-12-02 DIAGNOSIS — J301 Allergic rhinitis due to pollen: Secondary | ICD-10-CM | POA: Diagnosis not present

## 2021-12-02 DIAGNOSIS — J3081 Allergic rhinitis due to animal (cat) (dog) hair and dander: Secondary | ICD-10-CM | POA: Diagnosis not present

## 2021-12-02 MED ORDER — CHOLECALCIFEROL 1.25 MG (50000 UT) PO TABS: ORAL_TABLET | ORAL | 1 refills | Status: DC

## 2021-12-10 DIAGNOSIS — J301 Allergic rhinitis due to pollen: Secondary | ICD-10-CM | POA: Diagnosis not present

## 2021-12-10 DIAGNOSIS — J3089 Other allergic rhinitis: Secondary | ICD-10-CM | POA: Diagnosis not present

## 2021-12-10 DIAGNOSIS — J3081 Allergic rhinitis due to animal (cat) (dog) hair and dander: Secondary | ICD-10-CM | POA: Diagnosis not present

## 2021-12-21 DIAGNOSIS — J3081 Allergic rhinitis due to animal (cat) (dog) hair and dander: Secondary | ICD-10-CM | POA: Diagnosis not present

## 2021-12-21 DIAGNOSIS — J3089 Other allergic rhinitis: Secondary | ICD-10-CM | POA: Diagnosis not present

## 2021-12-21 DIAGNOSIS — J301 Allergic rhinitis due to pollen: Secondary | ICD-10-CM | POA: Diagnosis not present

## 2021-12-28 ENCOUNTER — Other Ambulatory Visit: Payer: Self-pay | Admitting: Family Medicine

## 2021-12-28 DIAGNOSIS — F3342 Major depressive disorder, recurrent, in full remission: Secondary | ICD-10-CM

## 2022-01-06 DIAGNOSIS — J301 Allergic rhinitis due to pollen: Secondary | ICD-10-CM | POA: Diagnosis not present

## 2022-01-06 DIAGNOSIS — J3081 Allergic rhinitis due to animal (cat) (dog) hair and dander: Secondary | ICD-10-CM | POA: Diagnosis not present

## 2022-01-06 DIAGNOSIS — J3089 Other allergic rhinitis: Secondary | ICD-10-CM | POA: Diagnosis not present

## 2022-01-20 DIAGNOSIS — J3081 Allergic rhinitis due to animal (cat) (dog) hair and dander: Secondary | ICD-10-CM | POA: Diagnosis not present

## 2022-01-20 DIAGNOSIS — J301 Allergic rhinitis due to pollen: Secondary | ICD-10-CM | POA: Diagnosis not present

## 2022-01-20 DIAGNOSIS — J3089 Other allergic rhinitis: Secondary | ICD-10-CM | POA: Diagnosis not present

## 2022-02-02 DIAGNOSIS — J3081 Allergic rhinitis due to animal (cat) (dog) hair and dander: Secondary | ICD-10-CM | POA: Diagnosis not present

## 2022-02-02 DIAGNOSIS — J301 Allergic rhinitis due to pollen: Secondary | ICD-10-CM | POA: Diagnosis not present

## 2022-02-02 DIAGNOSIS — J3089 Other allergic rhinitis: Secondary | ICD-10-CM | POA: Diagnosis not present

## 2022-02-09 ENCOUNTER — Encounter: Payer: Self-pay | Admitting: Internal Medicine

## 2022-02-09 ENCOUNTER — Other Ambulatory Visit: Payer: Self-pay | Admitting: Family Medicine

## 2022-02-09 ENCOUNTER — Other Ambulatory Visit: Payer: Self-pay | Admitting: Internal Medicine

## 2022-02-09 MED ORDER — CHOLESTYRAMINE 4 G PO PACK: 4.0000 g | PACK | Freq: Two times a day (BID) | ORAL | 3 refills | Status: DC

## 2022-02-11 DIAGNOSIS — J3081 Allergic rhinitis due to animal (cat) (dog) hair and dander: Secondary | ICD-10-CM | POA: Diagnosis not present

## 2022-02-11 DIAGNOSIS — J301 Allergic rhinitis due to pollen: Secondary | ICD-10-CM | POA: Diagnosis not present

## 2022-02-12 DIAGNOSIS — J3089 Other allergic rhinitis: Secondary | ICD-10-CM | POA: Diagnosis not present

## 2022-02-18 DIAGNOSIS — J3089 Other allergic rhinitis: Secondary | ICD-10-CM | POA: Diagnosis not present

## 2022-02-18 DIAGNOSIS — J3081 Allergic rhinitis due to animal (cat) (dog) hair and dander: Secondary | ICD-10-CM | POA: Diagnosis not present

## 2022-02-18 DIAGNOSIS — J301 Allergic rhinitis due to pollen: Secondary | ICD-10-CM | POA: Diagnosis not present

## 2022-02-22 ENCOUNTER — Other Ambulatory Visit: Payer: Self-pay | Admitting: Family Medicine

## 2022-03-04 DIAGNOSIS — J301 Allergic rhinitis due to pollen: Secondary | ICD-10-CM | POA: Diagnosis not present

## 2022-03-04 DIAGNOSIS — J3089 Other allergic rhinitis: Secondary | ICD-10-CM | POA: Diagnosis not present

## 2022-03-04 DIAGNOSIS — J3081 Allergic rhinitis due to animal (cat) (dog) hair and dander: Secondary | ICD-10-CM | POA: Diagnosis not present

## 2022-03-17 DIAGNOSIS — J3081 Allergic rhinitis due to animal (cat) (dog) hair and dander: Secondary | ICD-10-CM | POA: Diagnosis not present

## 2022-03-17 DIAGNOSIS — J301 Allergic rhinitis due to pollen: Secondary | ICD-10-CM | POA: Diagnosis not present

## 2022-03-17 DIAGNOSIS — J3089 Other allergic rhinitis: Secondary | ICD-10-CM | POA: Diagnosis not present

## 2022-03-21 ENCOUNTER — Other Ambulatory Visit: Payer: Self-pay | Admitting: Family Medicine

## 2022-03-21 DIAGNOSIS — F3342 Major depressive disorder, recurrent, in full remission: Secondary | ICD-10-CM

## 2022-03-31 DIAGNOSIS — J3081 Allergic rhinitis due to animal (cat) (dog) hair and dander: Secondary | ICD-10-CM | POA: Diagnosis not present

## 2022-03-31 DIAGNOSIS — J301 Allergic rhinitis due to pollen: Secondary | ICD-10-CM | POA: Diagnosis not present

## 2022-03-31 DIAGNOSIS — J3089 Other allergic rhinitis: Secondary | ICD-10-CM | POA: Diagnosis not present

## 2022-04-07 DIAGNOSIS — J3089 Other allergic rhinitis: Secondary | ICD-10-CM | POA: Diagnosis not present

## 2022-04-07 DIAGNOSIS — J301 Allergic rhinitis due to pollen: Secondary | ICD-10-CM | POA: Diagnosis not present

## 2022-04-07 DIAGNOSIS — J3081 Allergic rhinitis due to animal (cat) (dog) hair and dander: Secondary | ICD-10-CM | POA: Diagnosis not present

## 2022-04-14 DIAGNOSIS — J301 Allergic rhinitis due to pollen: Secondary | ICD-10-CM | POA: Diagnosis not present

## 2022-04-14 DIAGNOSIS — J3081 Allergic rhinitis due to animal (cat) (dog) hair and dander: Secondary | ICD-10-CM | POA: Diagnosis not present

## 2022-04-14 DIAGNOSIS — J3089 Other allergic rhinitis: Secondary | ICD-10-CM | POA: Diagnosis not present

## 2022-04-30 DIAGNOSIS — J3081 Allergic rhinitis due to animal (cat) (dog) hair and dander: Secondary | ICD-10-CM | POA: Diagnosis not present

## 2022-04-30 DIAGNOSIS — J3089 Other allergic rhinitis: Secondary | ICD-10-CM | POA: Diagnosis not present

## 2022-04-30 DIAGNOSIS — J301 Allergic rhinitis due to pollen: Secondary | ICD-10-CM | POA: Diagnosis not present

## 2022-05-04 ENCOUNTER — Encounter: Payer: Self-pay | Admitting: Family Medicine

## 2022-05-04 ENCOUNTER — Ambulatory Visit (INDEPENDENT_AMBULATORY_CARE_PROVIDER_SITE_OTHER): Payer: BC Managed Care – PPO | Admitting: Family Medicine

## 2022-05-04 VITALS — BP 128/70 | HR 85 | Temp 98.5°F | Ht 67.0 in | Wt 198.8 lb

## 2022-05-04 DIAGNOSIS — D509 Iron deficiency anemia, unspecified: Secondary | ICD-10-CM | POA: Diagnosis not present

## 2022-05-04 DIAGNOSIS — E785 Hyperlipidemia, unspecified: Secondary | ICD-10-CM

## 2022-05-04 DIAGNOSIS — E559 Vitamin D deficiency, unspecified: Secondary | ICD-10-CM | POA: Diagnosis not present

## 2022-05-04 DIAGNOSIS — I10 Essential (primary) hypertension: Secondary | ICD-10-CM | POA: Diagnosis not present

## 2022-05-04 DIAGNOSIS — F325 Major depressive disorder, single episode, in full remission: Secondary | ICD-10-CM

## 2022-05-04 NOTE — Progress Notes (Signed)
Phone [phone] In person visit   Subjective:   Garrett [name]. is a 46 y.o. 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    Priority: Medium    Major depressive disorder with single episode, in full remission ([place]) 04/16/2021    Priority: Medium    Vitamin D deficiency 03/04/2021    Priority: Medium    Vitamin B12 deficiency 03/01/2020    Priority: Medium    Hypertension 02/19/2014    Priority: Medium    IBS (irritable bowel syndrome) diarrhea predominant 08/14/2012    Priority: Medium    Allergic rhinitis 02/21/2012    Priority: Medium    Hyperlipidemia 09/05/2007    Priority: Medium    Depression 05/25/2007    Priority: Medium    GERD (gastroesophageal reflux disease) 11/29/2012    Priority: Low   Iron deficiency anemia due to chronic blood loss     Medications- reviewed and updated Current Outpatient Medications  Medication Sig Dispense Refill   Alpha-D-Galactosidase (BEANO PO) Take 1 tablet by mouth daily as needed (gas).     azelastine (ASTELIN) 137 MCG/SPRAY nasal spray Place 1 spray into both nostrils 2 (two) times daily. Use in each nostril as directed     budesonide (RHINOCORT AQUA) 32 MCG/ACT nasal spray Place 1 spray into both nostrils daily.     buPROPion (WELLBUTRIN XL) 150 MG 24 hr tablet Take 1 tablet by mouth once daily 90 tablet 0   Calcium Carbonate Antacid (TUMS PO) Take 2 tablets by mouth daily as needed (intergestion).     Cholecalciferol 1.25 MG (50000 UT) TABS 50,000 units PO qwk for 12 weeks. 13 tablet 1   cholestyramine (QUESTRAN) 4 g packet Take 1 packet (4 g total) by mouth 2 (two) times daily. 180 each 3   cyanocobalamin 1000 MCG tablet Take 1,000 mcg by mouth daily.     desloratadine  (CLARINEX) 5 MG tablet Take 5 mg by mouth daily.     EPINEPHrine 0.3 mg/0.3 mL IJ SOAJ injection Inject 0.3 mg into the muscle as needed for anaphylaxis.  0   escitalopram (LEXAPRO) 20 MG tablet Take 1 tablet by mouth once daily 90 tablet 0   ferrous sulfate 325 (65 FE) MG tablet Take 325 mg by mouth 2 (two) times a week.     hyoscyamine (LEVSIN SL) 0.125 MG SL tablet Place 1 tablet (0.125 mg total) under the tongue every 4 (four) hours as needed. (Patient taking differently: Place 0.125 mg under the tongue every 4 (four) hours as needed for cramping.) 60 tablet 5   lisinopril (ZESTRIL) 40 MG tablet Take 1 tablet by mouth once daily 90 tablet 0   Loperamide HCl (IMODIUM A-D PO) Take 1 capsule by mouth as needed (Diarrhea).     NON FORMULARY Allergy shots every other week     Omega-3 Fatty Acids (FISH OIL) 1200 MG CAPS Take 1,200 mg by mouth daily.     omeprazole (PRILOSEC) 20 MG capsule TAKE 1 CAPSULE BY MOUTH ONCE DAILY BEFORE BREAKFAST 90 capsule 0   pravastatin (PRAVACHOL) 40 MG tablet Take 1 tablet by mouth once daily 90 tablet 0   shark liver oil-cocoa butter (PREPARATION H) 0.25-3-85.5 % suppository Place 1 suppository rectally at bedtime.  No current facility-administered medications for this visit.     Objective:  BP 128/70   Pulse 85   Temp 98.5 F (36.9 C)   Ht '5\' 7"'$  (1.702 m)   Wt 198 lb 12.8 oz (90.2 kg)   SpO2 95%   BMI 31.14 kg/m  Gen: NAD, resting comfortably CV: RRR no murmurs rubs or gallops Lungs: CTAB no crackles, wheeze, rhonchi Abdomen: soft/nontender/nondistended/normal bowel sounds.  Ext: no edema Skin: warm, dry    Assessment and Plan   #hypertension S: medication: Lisinopril 40 mg BP Readings from Last 3 Encounters:  05/04/22 128/70  11/30/21 133/84  11/03/21 128/78  A/P: Controlled. Continue current medications.   #hyperlipidemia S: Medication: Pravastatin 40 mg, fish oil  Lab Results  Component Value Date   CHOL 158 11/03/2021   HDL 43.40  11/03/2021   LDLCALC 86 09/02/2020   LDLDIRECT 92.0 11/03/2021   TRIG 316.0 (H) 11/03/2021   CHOLHDL 4 11/03/2021  A/P: reasonable control- continue current medication as long as LDL at least under 100  # Depression S: Medication: Wellbutrin 150 mg, Lexapro 20 mg. Improved from last visit- feels like winter months may be worse    05/04/2022    2:32 PM 11/03/2021    2:58 PM 04/16/2021    3:01 PM  Depression screen PHQ 2/9  Decreased Interest 0 0 0  Down, Depressed, Hopeless 0 0 0  PHQ - 2 Score 0 0 0  Altered sleeping 0 1 1  Tired, decreased energy 0 2 1  Change in appetite 0 2 1  Feeling bad or failure about yourself  0 0 0  Trouble concentrating 0 0 0  Moving slowly or fidgety/restless 0 0 0  Suicidal thoughts 0 0 0  PHQ-9 Score 0 5 3  Difficult doing work/chores Not difficult at all Somewhat difficult Not difficult at all  A/P: full remission- continue  current medications    # GERD-also follows with Dr. Carlean Purl S:Medication: Omeprazole 20 mg -EGD reassuring July 25, 2020 without clear cause of iron or B12 deficiency- later thought to be avms but could not reach on procedure A/P: reasonably stable- continue current meds    #B12 deficiency (potentially related to B12 use)/iron deficiency S: Medication: Vitamin B12 1000 mcg, twice a week iron -No clear cause in 2021 on EGD or colonoscopy- later determined to be avms- will follow with Dr. Carlean Purl A/P: b12 looked good last visit- continue supplement- recheck next visit. Iron deficiency- check cbc and ferritin today- continue current meds   #Vitamin D deficiency- as low as 10.5 when detected S: Medication: 2000 units per day he thinks- finished high dose Last vitamin D Lab Results  Component Value Date   VD25OH 24.86 (L) 11/03/2021  A/P: hopefully improved- update vitamin D today today. Continue current meds for now   #IBS- patient continues peppermint oil, colestipol- most recent cholestyramine powder, hyoscyamine,  occasional Imodium  -Colonoscopy 2013 and 2021 with 10-year repeat  #Allergies- follows with DR. Harold Hedge- has bene rought season but reasonably stable. Clarinex plus 2 nasal sprays. Epi pen on hand due to immuntherapy every other week  Recommended follow up: Return in about 6 months (around 11/03/2022) for physical or sooner if needed.Schedule b4 you leave.  Lab/Order associations:   ICD-10-CM   1. Primary hypertension  I10 CBC with Differential/Platelet    Comprehensive metabolic panel    2. Hyperlipidemia, unspecified hyperlipidemia type  E78.5 CBC with Differential/Platelet    Comprehensive metabolic panel    3.  Vitamin D deficiency  E55.9 VITAMIN D 25 Hydroxy (Vit-D Deficiency, Fractures)    4. Iron deficiency anemia, unspecified iron deficiency anemia type  D50.9 IBC + Ferritin    5. Major depressive disorder with single episode, in full remission ([place]) Chronic F32.5      Return precautions advised.  [name], MD

## 2022-05-04 NOTE — Patient Instructions (Addendum)
Please stop by lab before you go If you have mychart- we will send your results within 3 business days of [place] receiving them.  If you do not have mychart- we will call you about results within 5 business days of [place] receiving them.  *please also note that you will see labs on mychart as soon as they post. I will later go in and write notes on them- will say "notes from Dr. Yong Channel"    Recommended follow up: Return in about 6 months (around 11/03/2022) for physical or sooner if needed.Schedule b4 you leave.

## 2022-05-05 LAB — COMPREHENSIVE METABOLIC PANEL
ALT: 20 U/L (ref 0–53)
AST: 17 U/L (ref 0–37)
Albumin: 4.7 g/dL (ref 3.5–5.2)
Alkaline Phosphatase: 57 U/L (ref 39–117)
BUN: 7 mg/dL (ref 6–23)
CO2: 27 mEq/L (ref 19–32)
Calcium: 9.8 mg/dL (ref 8.4–10.5)
Chloride: 102 mEq/L (ref 96–112)
Creatinine, Ser: 0.78 mg/dL (ref 0.40–1.50)
GFR: 107.01 mL/min (ref >60.00)
Glucose, Bld: 79 mg/dL (ref 70–99)
Potassium: 4.3 mEq/L (ref 3.5–5.1)
Sodium: 137 mEq/L (ref 135–145)
Total Bilirubin: 1 mg/dL (ref 0.2–1.2)
Total Protein: 7.1 g/dL (ref 6.0–8.3)

## 2022-05-05 LAB — CBC WITH DIFFERENTIAL/PLATELET
Basophils Absolute: 0 10*3/uL (ref 0.0–0.1)
Basophils Relative: 0.8 % (ref 0.0–3.0)
Eosinophils Absolute: 0.1 10*3/uL (ref 0.0–0.7)
Eosinophils Relative: 1.8 % (ref 0.0–5.0)
HCT: 38.7 % — ABNORMAL LOW (ref 39.0–52.0)
Hemoglobin: 13.1 g/dL (ref 13.0–17.0)
Lymphocytes Relative: 34.3 % (ref 12.0–46.0)
Lymphs Abs: 1.5 10*3/uL (ref 0.7–4.0)
MCHC: 33.8 g/dL (ref 30.0–36.0)
MCV: 84.5 fl (ref 78.0–100.0)
Monocytes Absolute: 0.4 10*3/uL (ref 0.1–1.0)
Monocytes Relative: 9.6 % (ref 3.0–12.0)
Neutro Abs: 2.3 10*3/uL (ref 1.4–7.7)
Neutrophils Relative %: 53.5 % (ref 43.0–77.0)
Platelets: 222 10*3/uL (ref 150.0–400.0)
RBC: 4.57 Mil/uL (ref 4.22–5.81)
RDW: 13.1 % (ref 11.5–15.5)
WBC: 4.3 10*3/uL (ref 4.0–10.5)

## 2022-05-05 LAB — VITAMIN D 25 HYDROXY (VIT D DEFICIENCY, FRACTURES): VITD: 45.17 ng/mL (ref 30.00–100.00)

## 2022-05-05 LAB — IBC + FERRITIN
Ferritin: 73.4 ng/mL (ref 22.0–322.0)
Iron: 75 ug/dL (ref 42–165)
Saturation Ratios: 17.3 % — ABNORMAL LOW (ref 20.0–50.0)
TIBC: 434 ug/dL (ref 250.0–450.0)
Transferrin: 310 mg/dL (ref 212.0–360.0)

## 2022-05-06 DIAGNOSIS — J3089 Other allergic rhinitis: Secondary | ICD-10-CM | POA: Diagnosis not present

## 2022-05-06 DIAGNOSIS — J3081 Allergic rhinitis due to animal (cat) (dog) hair and dander: Secondary | ICD-10-CM | POA: Diagnosis not present

## 2022-05-06 DIAGNOSIS — J301 Allergic rhinitis due to pollen: Secondary | ICD-10-CM | POA: Diagnosis not present

## 2022-05-10 ENCOUNTER — Other Ambulatory Visit: Payer: Self-pay | Admitting: Family Medicine

## 2022-05-11 DIAGNOSIS — J3081 Allergic rhinitis due to animal (cat) (dog) hair and dander: Secondary | ICD-10-CM | POA: Diagnosis not present

## 2022-05-11 DIAGNOSIS — J301 Allergic rhinitis due to pollen: Secondary | ICD-10-CM | POA: Diagnosis not present

## 2022-05-11 DIAGNOSIS — J3089 Other allergic rhinitis: Secondary | ICD-10-CM | POA: Diagnosis not present

## 2022-05-17 ENCOUNTER — Other Ambulatory Visit: Payer: Self-pay | Admitting: Family Medicine

## 2022-05-17 DIAGNOSIS — F3342 Major depressive disorder, recurrent, in full remission: Secondary | ICD-10-CM

## 2022-05-26 DIAGNOSIS — J3081 Allergic rhinitis due to animal (cat) (dog) hair and dander: Secondary | ICD-10-CM | POA: Diagnosis not present

## 2022-05-26 DIAGNOSIS — J301 Allergic rhinitis due to pollen: Secondary | ICD-10-CM | POA: Diagnosis not present

## 2022-05-26 DIAGNOSIS — J3089 Other allergic rhinitis: Secondary | ICD-10-CM | POA: Diagnosis not present

## 2022-06-09 DIAGNOSIS — J3089 Other allergic rhinitis: Secondary | ICD-10-CM | POA: Diagnosis not present

## 2022-06-09 DIAGNOSIS — J3081 Allergic rhinitis due to animal (cat) (dog) hair and dander: Secondary | ICD-10-CM | POA: Diagnosis not present

## 2022-06-09 DIAGNOSIS — J301 Allergic rhinitis due to pollen: Secondary | ICD-10-CM | POA: Diagnosis not present

## 2022-06-21 ENCOUNTER — Other Ambulatory Visit: Payer: Self-pay | Admitting: Family Medicine

## 2022-06-23 DIAGNOSIS — J301 Allergic rhinitis due to pollen: Secondary | ICD-10-CM | POA: Diagnosis not present

## 2022-06-23 DIAGNOSIS — J3089 Other allergic rhinitis: Secondary | ICD-10-CM | POA: Diagnosis not present

## 2022-06-23 DIAGNOSIS — J3081 Allergic rhinitis due to animal (cat) (dog) hair and dander: Secondary | ICD-10-CM | POA: Diagnosis not present

## 2022-06-28 ENCOUNTER — Other Ambulatory Visit: Payer: Self-pay | Admitting: Family Medicine

## 2022-07-08 DIAGNOSIS — J301 Allergic rhinitis due to pollen: Secondary | ICD-10-CM | POA: Diagnosis not present

## 2022-07-08 DIAGNOSIS — J3089 Other allergic rhinitis: Secondary | ICD-10-CM | POA: Diagnosis not present

## 2022-07-08 DIAGNOSIS — J3081 Allergic rhinitis due to animal (cat) (dog) hair and dander: Secondary | ICD-10-CM | POA: Diagnosis not present

## 2022-07-21 DIAGNOSIS — J3081 Allergic rhinitis due to animal (cat) (dog) hair and dander: Secondary | ICD-10-CM | POA: Diagnosis not present

## 2022-07-21 DIAGNOSIS — J3089 Other allergic rhinitis: Secondary | ICD-10-CM | POA: Diagnosis not present

## 2022-07-21 DIAGNOSIS — J301 Allergic rhinitis due to pollen: Secondary | ICD-10-CM | POA: Diagnosis not present

## 2022-07-26 IMAGING — DX DG ABDOMEN 1V
2 series · 2 of 2 positions shown · non-contrast
Comparison: None.

CLINICAL DATA: Evaluate for retained video capsule

EXAM:
ABDOMEN - 1 VIEW

[abdomen kub (1 of 2)]
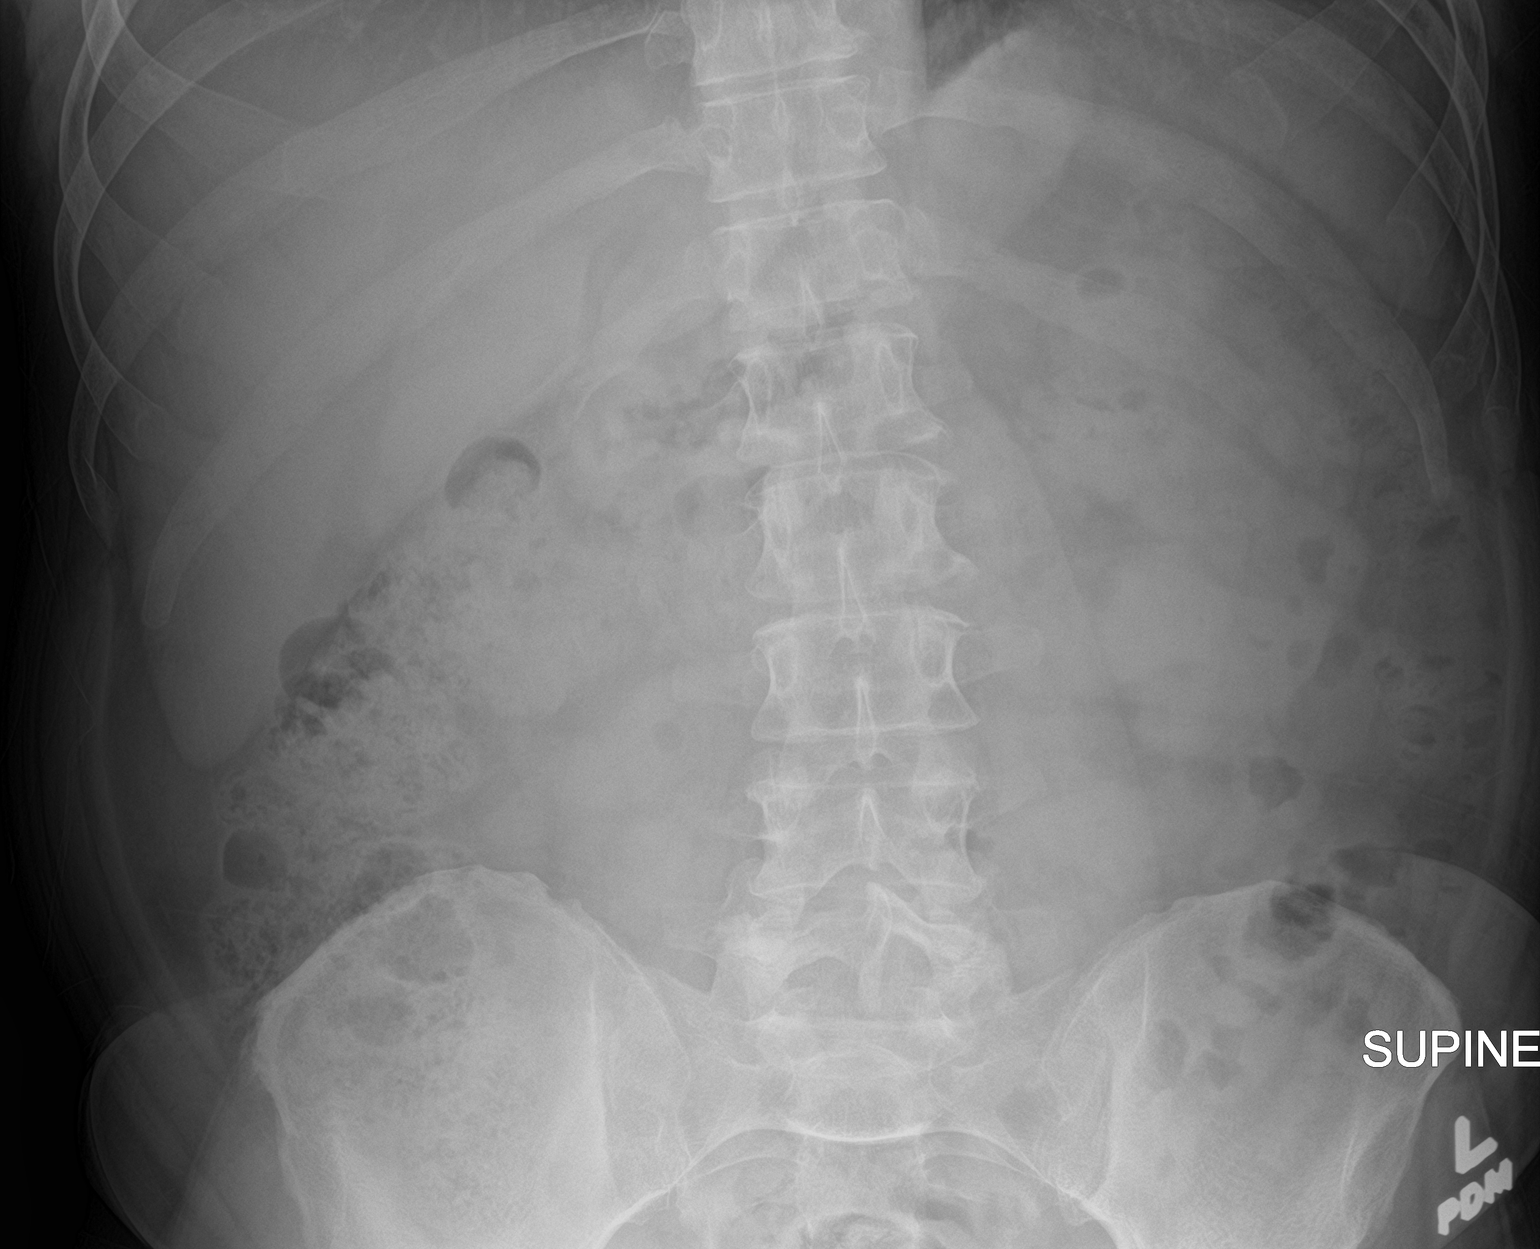

[abdomen kub (2 of 2)]
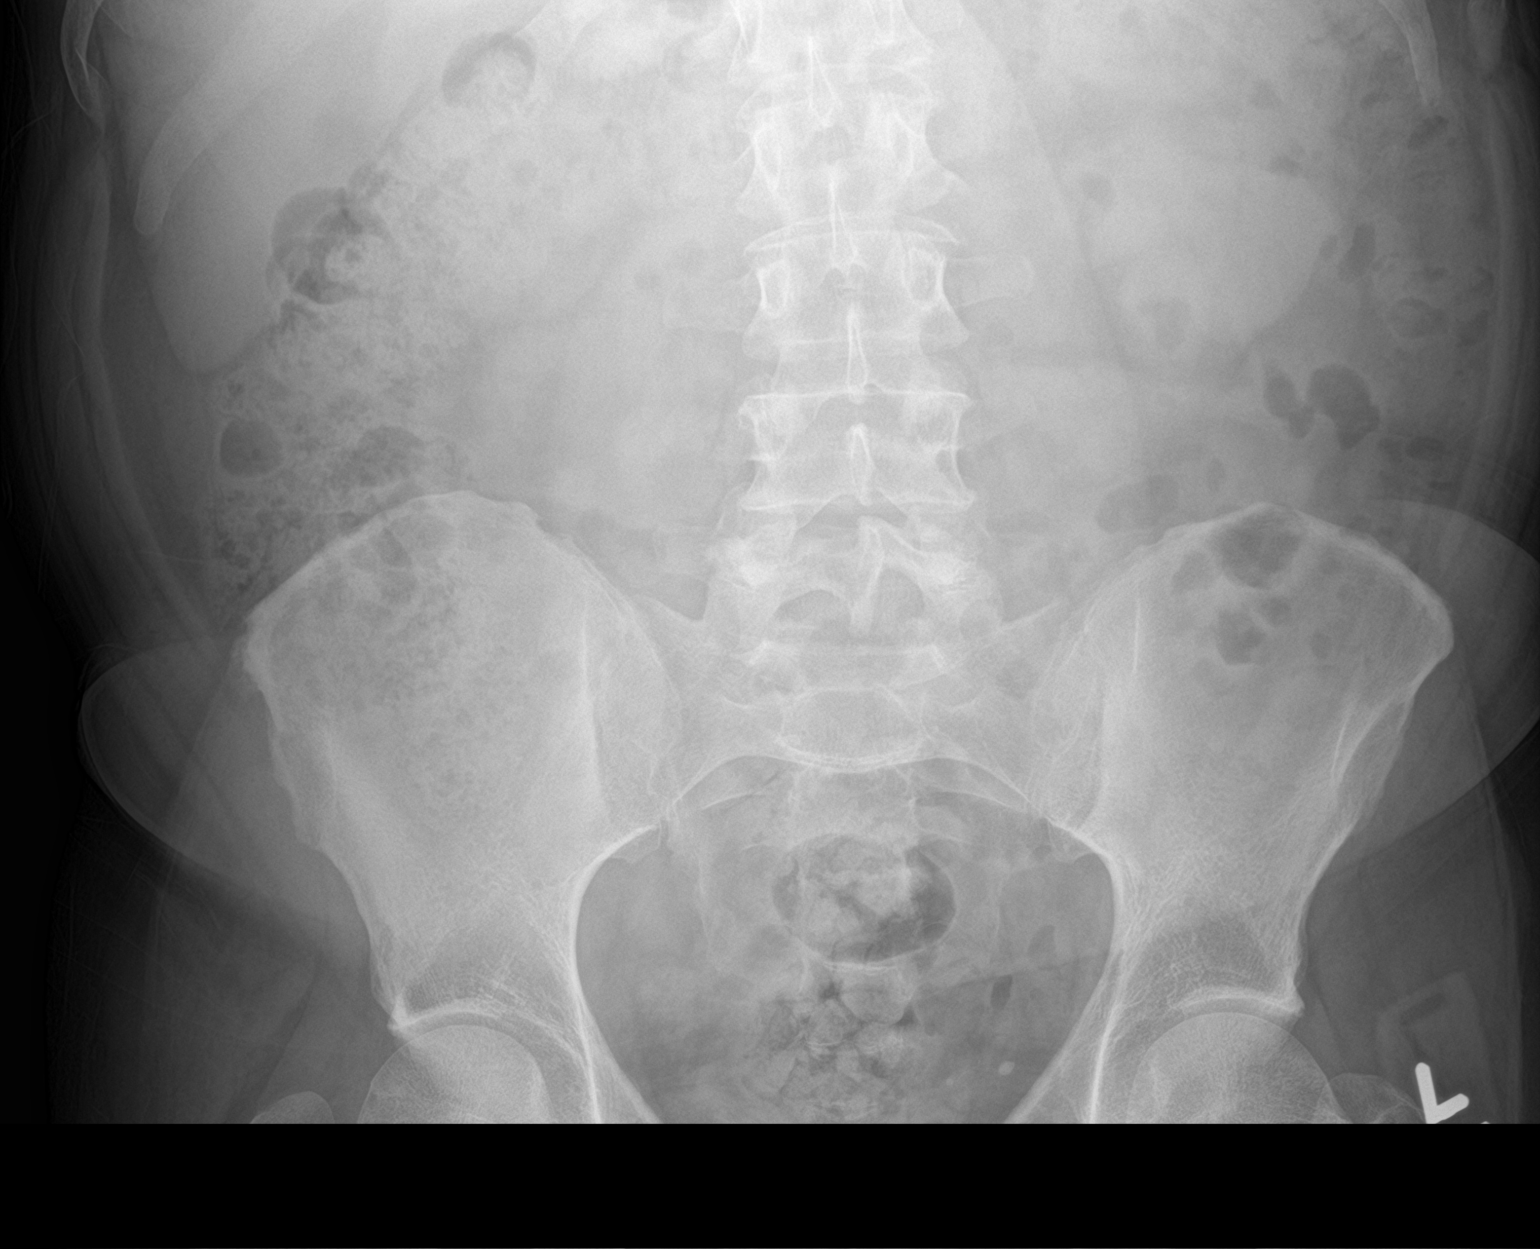

[2 of 2 positions shown; findings below may reference images not displayed]

FINDINGS: Scattered large and small bowel gas is noted. Mild fecal material is
seen. No radiopaque foreign body is identified to correspond with
the ingested video capsule. No bony abnormality is seen.
IMPRESSION: No retained foreign body noted.

## 2022-08-03 DIAGNOSIS — J301 Allergic rhinitis due to pollen: Secondary | ICD-10-CM | POA: Diagnosis not present

## 2022-08-03 DIAGNOSIS — J3081 Allergic rhinitis due to animal (cat) (dog) hair and dander: Secondary | ICD-10-CM | POA: Diagnosis not present

## 2022-08-03 DIAGNOSIS — J3089 Other allergic rhinitis: Secondary | ICD-10-CM | POA: Diagnosis not present

## 2022-08-16 ENCOUNTER — Encounter: Payer: Self-pay | Admitting: *Deleted

## 2022-08-18 DIAGNOSIS — J301 Allergic rhinitis due to pollen: Secondary | ICD-10-CM | POA: Diagnosis not present

## 2022-08-18 DIAGNOSIS — J3081 Allergic rhinitis due to animal (cat) (dog) hair and dander: Secondary | ICD-10-CM | POA: Diagnosis not present

## 2022-08-18 DIAGNOSIS — J3089 Other allergic rhinitis: Secondary | ICD-10-CM | POA: Diagnosis not present

## 2022-09-01 DIAGNOSIS — J301 Allergic rhinitis due to pollen: Secondary | ICD-10-CM | POA: Diagnosis not present

## 2022-09-01 DIAGNOSIS — J3081 Allergic rhinitis due to animal (cat) (dog) hair and dander: Secondary | ICD-10-CM | POA: Diagnosis not present

## 2022-09-01 DIAGNOSIS — J3089 Other allergic rhinitis: Secondary | ICD-10-CM | POA: Diagnosis not present

## 2022-09-07 ENCOUNTER — Other Ambulatory Visit: Payer: Self-pay | Admitting: Family Medicine

## 2022-09-13 DIAGNOSIS — J3089 Other allergic rhinitis: Secondary | ICD-10-CM | POA: Diagnosis not present

## 2022-09-13 DIAGNOSIS — J301 Allergic rhinitis due to pollen: Secondary | ICD-10-CM | POA: Diagnosis not present

## 2022-09-13 DIAGNOSIS — J3081 Allergic rhinitis due to animal (cat) (dog) hair and dander: Secondary | ICD-10-CM | POA: Diagnosis not present

## 2022-09-29 DIAGNOSIS — J301 Allergic rhinitis due to pollen: Secondary | ICD-10-CM | POA: Diagnosis not present

## 2022-09-29 DIAGNOSIS — J3081 Allergic rhinitis due to animal (cat) (dog) hair and dander: Secondary | ICD-10-CM | POA: Diagnosis not present

## 2022-09-29 DIAGNOSIS — J3089 Other allergic rhinitis: Secondary | ICD-10-CM | POA: Diagnosis not present

## 2022-10-04 ENCOUNTER — Other Ambulatory Visit: Payer: Self-pay | Admitting: Family Medicine

## 2022-10-04 DIAGNOSIS — F3342 Major depressive disorder, recurrent, in full remission: Secondary | ICD-10-CM

## 2022-10-05 DIAGNOSIS — J3081 Allergic rhinitis due to animal (cat) (dog) hair and dander: Secondary | ICD-10-CM | POA: Diagnosis not present

## 2022-10-05 DIAGNOSIS — J301 Allergic rhinitis due to pollen: Secondary | ICD-10-CM | POA: Diagnosis not present

## 2022-10-06 DIAGNOSIS — J3089 Other allergic rhinitis: Secondary | ICD-10-CM | POA: Diagnosis not present

## 2022-10-11 DIAGNOSIS — J3089 Other allergic rhinitis: Secondary | ICD-10-CM | POA: Diagnosis not present

## 2022-10-11 DIAGNOSIS — J301 Allergic rhinitis due to pollen: Secondary | ICD-10-CM | POA: Diagnosis not present

## 2022-10-11 DIAGNOSIS — J3081 Allergic rhinitis due to animal (cat) (dog) hair and dander: Secondary | ICD-10-CM | POA: Diagnosis not present

## 2022-10-18 ENCOUNTER — Other Ambulatory Visit: Payer: Self-pay | Admitting: Family Medicine

## 2022-10-26 DIAGNOSIS — J3089 Other allergic rhinitis: Secondary | ICD-10-CM | POA: Diagnosis not present

## 2022-10-26 DIAGNOSIS — J3081 Allergic rhinitis due to animal (cat) (dog) hair and dander: Secondary | ICD-10-CM | POA: Diagnosis not present

## 2022-10-26 DIAGNOSIS — J301 Allergic rhinitis due to pollen: Secondary | ICD-10-CM | POA: Diagnosis not present

## 2022-11-04 ENCOUNTER — Encounter: Payer: Self-pay | Admitting: *Deleted

## 2022-11-05 ENCOUNTER — Ambulatory Visit (INDEPENDENT_AMBULATORY_CARE_PROVIDER_SITE_OTHER): Payer: BC Managed Care – PPO | Admitting: Family Medicine

## 2022-11-05 ENCOUNTER — Encounter: Payer: Self-pay | Admitting: Family Medicine

## 2022-11-05 VITALS — BP 120/70 | HR 88 | Temp 98.1°F | Ht 67.0 in | Wt 199.6 lb

## 2022-11-05 DIAGNOSIS — E538 Deficiency of other specified B group vitamins: Secondary | ICD-10-CM

## 2022-11-05 DIAGNOSIS — E785 Hyperlipidemia, unspecified: Secondary | ICD-10-CM

## 2022-11-05 DIAGNOSIS — D500 Iron deficiency anemia secondary to blood loss (chronic): Secondary | ICD-10-CM

## 2022-11-05 DIAGNOSIS — Z0000 Encounter for general adult medical examination without abnormal findings: Secondary | ICD-10-CM

## 2022-11-05 DIAGNOSIS — E559 Vitamin D deficiency, unspecified: Secondary | ICD-10-CM | POA: Diagnosis not present

## 2022-11-05 NOTE — Patient Instructions (Addendum)
Schedule a lab visit at the check out desk within 2 weeks. Return for future fasting labs meaning nothing but water after midnight please. Ok to take your medications with water.    Recommended follow up: Return in about 6 months (around 05/07/2023) for followup or sooner if needed.Schedule b4 you leave.

## 2022-11-05 NOTE — Progress Notes (Signed)
Phone: [phone]    Subjective:  Patient presents today for their annual physical. Chief complaint-noted.   See problem oriented charting- ROS- full  review of systems was completed and negative  except for: nausea (mild but has IBS but mom also sick with diarrhea)  The following were reviewed and entered/updated in epic: Past Medical History:  Diagnosis Date   Acne    Allergy    Anxiety    Depression    External hemorrhoids    2004   GERD (gastroesophageal reflux disease)    HLD (hyperlipidemia)    Hypertension    Irritable bowel syndrome    Strabismus    Vitamin D deficiency 03/04/2021   Patient Active Problem List   Diagnosis Date Noted   Angiodysplasia of intestine 09/25/2021    Priority: Medium    Major depressive disorder with single episode, in full remission ([place]) 04/16/2021    Priority: Medium    Vitamin D deficiency 03/04/2021    Priority: Medium    Vitamin B12 deficiency 03/01/2020    Priority: Medium    Hypertension 02/19/2014    Priority: Medium    IBS (irritable bowel syndrome) diarrhea predominant 08/14/2012    Priority: Medium    Allergic rhinitis 02/21/2012    Priority: Medium    Hyperlipidemia 09/05/2007    Priority: Medium    Depression 05/25/2007    Priority: Medium    GERD (gastroesophageal reflux disease) 11/29/2012    Priority: Low   Iron deficiency anemia due to chronic blood loss    Past Surgical History:  Procedure Laterality Date   COLONOSCOPY  10/16/2012   ENTEROSCOPY N/A 11/30/2021   Procedure: ENTEROSCOPY;  Surgeon: Gatha Mayer, MD;  Location: [facility];  Service: Endoscopy;  Laterality: N/A;   ESOPHAGOGASTRODUODENOSCOPY  2021   FLEXIBLE SIGMOIDOSCOPY     hemorrhoids-rule out internal bleeding   STRABISMUS SURGERY  2001, 2010   X 2   SUBMUCOSAL TATTOO INJECTION  11/30/2021   Procedure: SUBMUCOSAL TATTOO INJECTION;  Surgeon: Gatha Mayer, MD;  Location: [facility];  Service: Endoscopy;;   WISDOM TOOTH EXTRACTION       Family History  Problem Relation Age of Onset   Breast cancer Mother    Hypertension Mother    Hyperlipidemia Mother    Prostate cancer Father        late 70s   Ulcerative colitis Father    Hypertension Father    Hyperlipidemia Father    Colon cancer Neg Hx    Rectal cancer Neg Hx    Stomach cancer Neg Hx    Esophageal cancer Neg Hx     Medications- reviewed and updated Current Outpatient Medications  Medication Sig Dispense Refill   Alpha-D-Galactosidase (BEANO PO) Take 1 tablet by mouth daily as needed (gas).     azelastine (ASTELIN) 137 MCG/SPRAY nasal spray Place 1 spray into both nostrils 2 (two) times daily. Use in each nostril as directed     budesonide (RHINOCORT AQUA) 32 MCG/ACT nasal spray Place 1 spray into both nostrils daily.     buPROPion (WELLBUTRIN XL) 150 MG 24 hr tablet Take 1 tablet by mouth once daily 90 tablet 0   Calcium Carbonate Antacid (TUMS PO) Take 2 tablets by mouth daily as needed (intergestion).     Cholecalciferol 1.25 MG (50000 UT) TABS 50,000 units PO qwk for 12 weeks. 13 tablet 1   colestipol (COLESTID) 1 g tablet Take 2 g by mouth 2 (two) times daily.     cyanocobalamin  1000 MCG tablet Take 1,000 mcg by mouth daily.     desloratadine (CLARINEX) 5 MG tablet Take 5 mg by mouth daily.     EPINEPHrine 0.3 mg/0.3 mL IJ SOAJ injection Inject 0.3 mg into the muscle as needed for anaphylaxis.  0   escitalopram (LEXAPRO) 20 MG tablet Take 1 tablet by mouth once daily 90 tablet 0   ferrous sulfate 325 (65 FE) MG tablet Take 325 mg by mouth 2 (two) times a week.     hyoscyamine (LEVSIN SL) 0.125 MG SL tablet Place 1 tablet (0.125 mg total) under the tongue every 4 (four) hours as needed. (Patient taking differently: Place 0.125 mg under the tongue every 4 (four) hours as needed for cramping.) 60 tablet 5   lisinopril (ZESTRIL) 40 MG tablet Take 1 tablet by mouth once daily 90 tablet 0   Loperamide HCl (IMODIUM A-D PO) Take 1 capsule by mouth as needed  (Diarrhea).     NON FORMULARY Allergy shots every other week     Omega-3 Fatty Acids (FISH OIL) 1200 MG CAPS Take 1,200 mg by mouth daily.     omeprazole (PRILOSEC) 20 MG capsule TAKE 1 CAPSULE BY MOUTH ONCE DAILY BEFORE BREAKFAST 90 capsule 0   pravastatin (PRAVACHOL) 40 MG tablet Take 1 tablet by mouth once daily 90 tablet 0   shark liver oil-cocoa butter (PREPARATION H) 0.25-3-85.5 % suppository Place 1 suppository rectally at bedtime.     No current facility-administered medications for this visit.    Allergies-reviewed and updated No Known Allergies  Social History   Social History Narrative   Single. Live with parents.       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      Hobbies: reading, time at mall, movies      Habits never smoker 2 caffeinated beverages daily no alcohol no drug use no other tobacco      Objective:  BP 120/70   Pulse 88   Temp 98.1 F (36.7 C)   Ht '5\' 7"'$  (1.702 m)   Wt 199 lb 9.6 oz (90.5 kg)   SpO2 98%   BMI 31.26 kg/m  Gen: NAD, resting comfortably HEENT: Mucous membranes are moist. Oropharynx normal Neck: no thyromegaly CV: RRR no murmurs rubs or gallops Lungs: CTAB no crackles, wheeze, rhonchi Abdomen: soft/nontender/nondistended/normal bowel sounds. No rebound or guarding.  Ext: no edema Skin: warm, dry Neuro: grossly normal, moves all extremities, PERRLA     Assessment and Plan:  46 y.o. male presenting for annual physical.  Health Maintenance counseling: 1. Anticipatory guidance: Patient counseled regarding regular dental exams -q6 months, eye exams - q2 years,  avoiding smoking and second hand smoke , limiting alcohol to 2 beverages per day, no illicit drugs.   2. Risk factor reduction:  Advised patient of need for regular exercise and diet rich and fruits and vegetables to reduce risk of heart attack and stroke.  Exercise- walking not as much lately-  wants to restart in the new year- goal 150 minutes per week.  Diet/weight management-weight stable from last year but still would like to have him lose 10-15 lbs in the next year if not more- feels could work on portion control- harder to pick diet with IBS-fruits/veggies are strigger  Wt Readings from Last 3 Encounters:  11/05/22 199 lb 9.6 oz (90.5 kg)  05/04/22 198 lb 12.8 oz (90.2 kg)  11/30/21 195  lb (88.5 kg)  3. Immunizations/screenings/ancillary studies- covid 19 shot discussed-holding off for now  Immunization History  Administered Date(s) Administered   Influenza Split 09/11/2012   Influenza Whole 08/22/2005, 09/04/2007, 08/03/2010, 08/26/2020   Influenza, High Dose Seasonal PF 08/29/2017   Influenza,inj,Quad PF,6+ Mos 08/09/2013, 09/08/2016, 07/19/2017, 07/31/2018, 07/31/2019, 09/20/2022   Influenza-Unspecified 09/05/2014, 08/21/2015   PFIZER(Purple Top)SARS-COV-2 Vaccination 12/06/2019, 12/25/2019, 04/08/2020, 10/13/2020   Pfizer Covid-19 Vaccine Bivalent Booster 38yr & up 10/28/2021   Td 01/29/2010   Tdap 03/12/2008, 02/29/2020  4. Prostate cancer screening-father with prostate cancer in his 70s-begin screening age [id number]   534 Colon cancer screening - 07/25/2020 with 10-year follow-up planned- due to IBS 6. Skin cancer screening/prevention- about every 5 years. advised regular sunscreen use. Denies worrisome, changing, or new skin lesions.  7. Testicular cancer screening- advised monthly self exams  8. STD screening- patient opts out- opts out- not dating/sexually active 9. Smoking associated screening- never smoker  Status of chronic or acute concerns   #hypertension S: medication: Lisinopril 40 mg  BP Readings from Last 3 Encounters:  11/05/22 120/70  05/04/22 128/70  11/30/21 133/84  A/P: stable- continue current medicines    #hyperlipidemia S: Medication: Pravastatin 40 mg, fish oil Lab Results  Component Value Date   CHOL 158 11/03/2021   HDL 43.40  11/03/2021   LDLCALC 86 09/02/2020   LDLDIRECT 92.0 11/03/2021   TRIG 316.0 (H) 11/03/2021   CHOLHDL 4 11/03/2021  A/P: lipids reasonable for LDL but triglycerides high- warned of need to cut sugars/simple carbs/minimize alcohol (already does) and weight loss to help triglycerides- has to start exercising and work on weight loss  # Depression S: Medication: Wellbutrin 150 mg, Lexapro 20 mg    11/05/2022    1:51 PM 05/04/2022    2:32 PM 11/03/2021    2:58 PM  Depression screen PHQ 2/9  Decreased Interest 0 0 0  Down, Depressed, Hopeless 0 0 0  PHQ - 2 Score 0 0 0  Altered sleeping 0 0 1  Tired, decreased energy 0 0 2  Change in appetite 0 0 2  Feeling bad or failure about yourself  0 0 0  Trouble concentrating 0 0 0  Moving slowly or fidgety/restless 0 0 0  Suicidal thoughts 0 0 0  PHQ-9 Score 0 0 5  Difficult doing work/chores Not difficult at all Not difficult at all Somewhat difficult   A/P: full remission- prefers to stay on current meds- winter can be tough but doing ok    # GERD-also follows with Dr. GCarlean PurlS:Medication: Omeprazole 20 mg -EGD reassuring July 25, 2020 without clear cause of iron or B12 deficiency- later thought to be avms but could not reach on procedure A/P: prior iron deficiency-update iron levels with labs still on iron twice a week GERD controlled- continue current medications     #B12 deficiency  S: Medication: Vitamin B12 1000 mcg daily -No clear cause in 2021 on EGD or colonoscopy A/P: hopefully stable- update b12 today. Continue current meds for now   #Vitamin D deficiency- as low as 10.5 when detected S: Medication: thinks 1000 units at least A/P: hopefully stable- update vitamin D today. Continue current meds for now   #IBS- patient continues peppermint oil, colestipol, hyoscyamine, rare use), occasional Imodium  -Colonoscopy 2013 and 2021 with 10-year repeat  Recommended follow up: Return in about 6 months (around 05/07/2023) for  followup or sooner if needed.Schedule b4 you leave.  Lab/Order associations:NOT fasting- will come back fasting  ICD-10-CM   1. Preventative health care  Z00.00     2. Hyperlipidemia, unspecified hyperlipidemia type  E78.5 CBC with Differential/Platelet    Comprehensive metabolic panel    Lipid panel    3. Vitamin B12 deficiency  E53.8 Vitamin B12    4. Vitamin D deficiency  E55.9 VITAMIN D 25 Hydroxy (Vit-D Deficiency, Fractures)    5. Iron deficiency anemia due to chronic blood loss  D50.0 IBC + Ferritin      No orders of the defined types were placed in this encounter.   Return precautions advised.   [name], MD

## 2022-11-09 DIAGNOSIS — J3081 Allergic rhinitis due to animal (cat) (dog) hair and dander: Secondary | ICD-10-CM | POA: Diagnosis not present

## 2022-11-09 DIAGNOSIS — J3089 Other allergic rhinitis: Secondary | ICD-10-CM | POA: Diagnosis not present

## 2022-11-09 DIAGNOSIS — J301 Allergic rhinitis due to pollen: Secondary | ICD-10-CM | POA: Diagnosis not present

## 2022-11-18 ENCOUNTER — Other Ambulatory Visit: Payer: BC Managed Care – PPO

## 2022-11-24 DIAGNOSIS — J3081 Allergic rhinitis due to animal (cat) (dog) hair and dander: Secondary | ICD-10-CM | POA: Diagnosis not present

## 2022-11-24 DIAGNOSIS — J301 Allergic rhinitis due to pollen: Secondary | ICD-10-CM | POA: Diagnosis not present

## 2022-11-24 DIAGNOSIS — J3089 Other allergic rhinitis: Secondary | ICD-10-CM | POA: Diagnosis not present

## 2022-12-02 ENCOUNTER — Other Ambulatory Visit: Payer: Self-pay | Admitting: Family Medicine

## 2022-12-09 DIAGNOSIS — J301 Allergic rhinitis due to pollen: Secondary | ICD-10-CM | POA: Diagnosis not present

## 2022-12-09 DIAGNOSIS — J3089 Other allergic rhinitis: Secondary | ICD-10-CM | POA: Diagnosis not present

## 2022-12-09 DIAGNOSIS — J3081 Allergic rhinitis due to animal (cat) (dog) hair and dander: Secondary | ICD-10-CM | POA: Diagnosis not present

## 2022-12-12 ENCOUNTER — Other Ambulatory Visit: Payer: Self-pay | Admitting: Family Medicine

## 2022-12-12 DIAGNOSIS — F3342 Major depressive disorder, recurrent, in full remission: Secondary | ICD-10-CM

## 2022-12-14 ENCOUNTER — Other Ambulatory Visit: Payer: Self-pay | Admitting: Family Medicine

## 2022-12-14 ENCOUNTER — Other Ambulatory Visit (INDEPENDENT_AMBULATORY_CARE_PROVIDER_SITE_OTHER): Payer: BC Managed Care – PPO

## 2022-12-14 DIAGNOSIS — E538 Deficiency of other specified B group vitamins: Secondary | ICD-10-CM

## 2022-12-14 DIAGNOSIS — E559 Vitamin D deficiency, unspecified: Secondary | ICD-10-CM

## 2022-12-14 DIAGNOSIS — E785 Hyperlipidemia, unspecified: Secondary | ICD-10-CM | POA: Diagnosis not present

## 2022-12-14 DIAGNOSIS — D500 Iron deficiency anemia secondary to blood loss (chronic): Secondary | ICD-10-CM

## 2022-12-14 LAB — CBC WITH DIFFERENTIAL/PLATELET
Basophils Absolute: 0 10*3/uL (ref 0.0–0.1)
Basophils Relative: 0.4 % (ref 0.0–3.0)
Eosinophils Absolute: 0.1 10*3/uL (ref 0.0–0.7)
Eosinophils Relative: 2.2 % (ref 0.0–5.0)
HCT: 39.4 % (ref 39.0–52.0)
Hemoglobin: 13.3 g/dL (ref 13.0–17.0)
Lymphocytes Relative: 37.5 % (ref 12.0–46.0)
Lymphs Abs: 1.8 10*3/uL (ref 0.7–4.0)
MCHC: 33.7 g/dL (ref 30.0–36.0)
MCV: 83.4 fl (ref 78.0–100.0)
Monocytes Absolute: 0.4 10*3/uL (ref 0.1–1.0)
Monocytes Relative: 8.5 % (ref 3.0–12.0)
Neutro Abs: 2.5 10*3/uL (ref 1.4–7.7)
Neutrophils Relative %: 51.4 % (ref 43.0–77.0)
Platelets: 240 10*3/uL (ref 150.0–400.0)
RBC: 4.73 Mil/uL (ref 4.22–5.81)
RDW: 13.3 % (ref 11.5–15.5)
WBC: 4.8 10*3/uL (ref 4.0–10.5)

## 2022-12-14 LAB — COMPREHENSIVE METABOLIC PANEL
ALT: 17 U/L (ref 0–53)
AST: 15 U/L (ref 0–37)
Albumin: 4.8 g/dL (ref 3.5–5.2)
Alkaline Phosphatase: 65 U/L (ref 39–117)
BUN: 8 mg/dL (ref 6–23)
CO2: 28 mEq/L (ref 19–32)
Calcium: 9.6 mg/dL (ref 8.4–10.5)
Chloride: 98 mEq/L (ref 96–112)
Creatinine, Ser: 0.81 mg/dL (ref 0.40–1.50)
GFR: 105.35 mL/min (ref >60.00)
Glucose, Bld: 82 mg/dL (ref 70–99)
Potassium: 4 mEq/L (ref 3.5–5.1)
Sodium: 136 mEq/L (ref 135–145)
Total Bilirubin: 0.9 mg/dL (ref 0.2–1.2)
Total Protein: 7 g/dL (ref 6.0–8.3)

## 2022-12-14 LAB — VITAMIN B12: Vitamin B-12: 1174 pg/mL — ABNORMAL HIGH (ref 211–911)

## 2022-12-14 LAB — LIPID PANEL
Cholesterol: 161 mg/dL (ref 0–200)
HDL: 44.6 mg/dL (ref >39.00)
NonHDL: 116.28
Total CHOL/HDL Ratio: 4
Triglycerides: 253 mg/dL — ABNORMAL HIGH (ref 0.0–149.0)
VLDL: 50.6 mg/dL — ABNORMAL HIGH (ref 0.0–40.0)

## 2022-12-14 LAB — IBC + FERRITIN
Ferritin: 79.1 ng/mL (ref 22.0–322.0)
Iron: 81 ug/dL (ref 42–165)
Saturation Ratios: 20.8 % (ref 20.0–50.0)
TIBC: 389.2 ug/dL (ref 250.0–450.0)
Transferrin: 278 mg/dL (ref 212.0–360.0)

## 2022-12-14 LAB — VITAMIN D 25 HYDROXY (VIT D DEFICIENCY, FRACTURES): VITD: 22.71 ng/mL — ABNORMAL LOW (ref 30.00–100.00)

## 2022-12-14 LAB — LDL CHOLESTEROL, DIRECT: Direct LDL: 85 mg/dL

## 2022-12-14 MED ORDER — VITAMIN D (ERGOCALCIFEROL) 1.25 MG (50000 UNIT) PO CAPS: 50000.0000 [IU] | ORAL_CAPSULE | ORAL | 1 refills | Status: DC

## 2022-12-22 ENCOUNTER — Telehealth: Payer: Self-pay | Admitting: *Deleted

## 2022-12-22 ENCOUNTER — Other Ambulatory Visit: Payer: Self-pay | Admitting: Family Medicine

## 2022-12-22 DIAGNOSIS — J3089 Other allergic rhinitis: Secondary | ICD-10-CM | POA: Diagnosis not present

## 2022-12-22 DIAGNOSIS — J3081 Allergic rhinitis due to animal (cat) (dog) hair and dander: Secondary | ICD-10-CM | POA: Diagnosis not present

## 2022-12-22 DIAGNOSIS — J301 Allergic rhinitis due to pollen: Secondary | ICD-10-CM | POA: Diagnosis not present

## 2022-12-22 NOTE — Telephone Encounter (Signed)
Last OV 11/05/2022 Last refill by historical provider

## 2022-12-22 NOTE — Telephone Encounter (Signed)
Error

## 2022-12-27 ENCOUNTER — Other Ambulatory Visit: Payer: Self-pay | Admitting: Family Medicine

## 2023-01-05 DIAGNOSIS — J3081 Allergic rhinitis due to animal (cat) (dog) hair and dander: Secondary | ICD-10-CM | POA: Diagnosis not present

## 2023-01-05 DIAGNOSIS — J301 Allergic rhinitis due to pollen: Secondary | ICD-10-CM | POA: Diagnosis not present

## 2023-01-05 DIAGNOSIS — J3089 Other allergic rhinitis: Secondary | ICD-10-CM | POA: Diagnosis not present

## 2023-01-17 ENCOUNTER — Other Ambulatory Visit: Payer: Self-pay | Admitting: Family Medicine

## 2023-01-18 DIAGNOSIS — J3089 Other allergic rhinitis: Secondary | ICD-10-CM | POA: Diagnosis not present

## 2023-01-18 DIAGNOSIS — J3081 Allergic rhinitis due to animal (cat) (dog) hair and dander: Secondary | ICD-10-CM | POA: Diagnosis not present

## 2023-01-18 DIAGNOSIS — J301 Allergic rhinitis due to pollen: Secondary | ICD-10-CM | POA: Diagnosis not present

## 2023-01-24 DIAGNOSIS — J3081 Allergic rhinitis due to animal (cat) (dog) hair and dander: Secondary | ICD-10-CM | POA: Diagnosis not present

## 2023-01-24 DIAGNOSIS — J301 Allergic rhinitis due to pollen: Secondary | ICD-10-CM | POA: Diagnosis not present

## 2023-01-24 DIAGNOSIS — J3089 Other allergic rhinitis: Secondary | ICD-10-CM | POA: Diagnosis not present

## 2023-01-31 DIAGNOSIS — J301 Allergic rhinitis due to pollen: Secondary | ICD-10-CM | POA: Diagnosis not present

## 2023-01-31 DIAGNOSIS — J3089 Other allergic rhinitis: Secondary | ICD-10-CM | POA: Diagnosis not present

## 2023-01-31 DIAGNOSIS — J3081 Allergic rhinitis due to animal (cat) (dog) hair and dander: Secondary | ICD-10-CM | POA: Diagnosis not present

## 2023-02-16 DIAGNOSIS — J3081 Allergic rhinitis due to animal (cat) (dog) hair and dander: Secondary | ICD-10-CM | POA: Diagnosis not present

## 2023-02-16 DIAGNOSIS — J3089 Other allergic rhinitis: Secondary | ICD-10-CM | POA: Diagnosis not present

## 2023-02-16 DIAGNOSIS — J301 Allergic rhinitis due to pollen: Secondary | ICD-10-CM | POA: Diagnosis not present

## 2023-03-02 DIAGNOSIS — J301 Allergic rhinitis due to pollen: Secondary | ICD-10-CM | POA: Diagnosis not present

## 2023-03-02 DIAGNOSIS — J3089 Other allergic rhinitis: Secondary | ICD-10-CM | POA: Diagnosis not present

## 2023-03-02 DIAGNOSIS — J3081 Allergic rhinitis due to animal (cat) (dog) hair and dander: Secondary | ICD-10-CM | POA: Diagnosis not present

## 2023-03-16 DIAGNOSIS — J3081 Allergic rhinitis due to animal (cat) (dog) hair and dander: Secondary | ICD-10-CM | POA: Diagnosis not present

## 2023-03-16 DIAGNOSIS — J3089 Other allergic rhinitis: Secondary | ICD-10-CM | POA: Diagnosis not present

## 2023-03-16 DIAGNOSIS — J301 Allergic rhinitis due to pollen: Secondary | ICD-10-CM | POA: Diagnosis not present

## 2023-03-30 DIAGNOSIS — J3081 Allergic rhinitis due to animal (cat) (dog) hair and dander: Secondary | ICD-10-CM | POA: Diagnosis not present

## 2023-03-30 DIAGNOSIS — J301 Allergic rhinitis due to pollen: Secondary | ICD-10-CM | POA: Diagnosis not present

## 2023-03-30 DIAGNOSIS — J3089 Other allergic rhinitis: Secondary | ICD-10-CM | POA: Diagnosis not present

## 2023-04-04 ENCOUNTER — Other Ambulatory Visit: Payer: Self-pay | Admitting: Family Medicine

## 2023-04-04 DIAGNOSIS — F3342 Major depressive disorder, recurrent, in full remission: Secondary | ICD-10-CM

## 2023-04-13 DIAGNOSIS — J3089 Other allergic rhinitis: Secondary | ICD-10-CM | POA: Diagnosis not present

## 2023-04-13 DIAGNOSIS — J301 Allergic rhinitis due to pollen: Secondary | ICD-10-CM | POA: Diagnosis not present

## 2023-04-13 DIAGNOSIS — J3081 Allergic rhinitis due to animal (cat) (dog) hair and dander: Secondary | ICD-10-CM | POA: Diagnosis not present

## 2023-04-18 ENCOUNTER — Other Ambulatory Visit: Payer: Self-pay | Admitting: Family Medicine

## 2023-04-26 DIAGNOSIS — J3081 Allergic rhinitis due to animal (cat) (dog) hair and dander: Secondary | ICD-10-CM | POA: Diagnosis not present

## 2023-04-26 DIAGNOSIS — J301 Allergic rhinitis due to pollen: Secondary | ICD-10-CM | POA: Diagnosis not present

## 2023-04-26 DIAGNOSIS — J3089 Other allergic rhinitis: Secondary | ICD-10-CM | POA: Diagnosis not present

## 2023-05-02 DIAGNOSIS — J3089 Other allergic rhinitis: Secondary | ICD-10-CM | POA: Diagnosis not present

## 2023-05-02 DIAGNOSIS — J301 Allergic rhinitis due to pollen: Secondary | ICD-10-CM | POA: Diagnosis not present

## 2023-05-02 DIAGNOSIS — J3081 Allergic rhinitis due to animal (cat) (dog) hair and dander: Secondary | ICD-10-CM | POA: Diagnosis not present

## 2023-05-09 DIAGNOSIS — J3089 Other allergic rhinitis: Secondary | ICD-10-CM | POA: Diagnosis not present

## 2023-05-09 DIAGNOSIS — J3081 Allergic rhinitis due to animal (cat) (dog) hair and dander: Secondary | ICD-10-CM | POA: Diagnosis not present

## 2023-05-09 DIAGNOSIS — J301 Allergic rhinitis due to pollen: Secondary | ICD-10-CM | POA: Diagnosis not present

## 2023-05-12 ENCOUNTER — Ambulatory Visit (INDEPENDENT_AMBULATORY_CARE_PROVIDER_SITE_OTHER): Payer: BC Managed Care – PPO | Admitting: Family Medicine

## 2023-05-12 ENCOUNTER — Encounter: Payer: Self-pay | Admitting: Family Medicine

## 2023-05-12 VITALS — BP 120/80 | HR 81 | Temp 97.8°F | Ht 67.0 in | Wt 199.2 lb

## 2023-05-12 DIAGNOSIS — E559 Vitamin D deficiency, unspecified: Secondary | ICD-10-CM | POA: Diagnosis not present

## 2023-05-12 DIAGNOSIS — I10 Essential (primary) hypertension: Secondary | ICD-10-CM

## 2023-05-12 DIAGNOSIS — F325 Major depressive disorder, single episode, in full remission: Secondary | ICD-10-CM

## 2023-05-12 DIAGNOSIS — E785 Hyperlipidemia, unspecified: Secondary | ICD-10-CM | POA: Diagnosis not present

## 2023-05-12 DIAGNOSIS — Z131 Encounter for screening for diabetes mellitus: Secondary | ICD-10-CM

## 2023-05-12 DIAGNOSIS — E669 Obesity, unspecified: Secondary | ICD-10-CM | POA: Diagnosis not present

## 2023-05-12 DIAGNOSIS — D500 Iron deficiency anemia secondary to blood loss (chronic): Secondary | ICD-10-CM | POA: Diagnosis not present

## 2023-05-12 NOTE — Progress Notes (Signed)
Phone [phone] In person visit   Subjective:   Garrett [name]. is a 47 y.o. year old very pleasant male patient who presents for/with See problem oriented charting Chief Complaint  Patient presents with   Medical Management of Chronic Issues   Hyperlipidemia   Hypertension    Past Medical History-  Patient Active Problem List   Diagnosis Date Noted   Angiodysplasia of intestine 09/25/2021    Priority: Medium    Major depressive disorder with single episode, in full remission ([facility]) 04/16/2021    Priority: Medium    Vitamin D deficiency 03/04/2021    Priority: Medium    Vitamin B12 deficiency 03/01/2020    Priority: Medium    Hypertension 02/19/2014    Priority: Medium    IBS (irritable bowel syndrome) diarrhea predominant 08/14/2012    Priority: Medium    Allergic rhinitis 02/21/2012    Priority: Medium    Hyperlipidemia 09/05/2007    Priority: Medium    Depression 05/25/2007    Priority: Medium    GERD (gastroesophageal reflux disease) 11/29/2012    Priority: Low   Iron deficiency anemia due to chronic blood loss     Medications- reviewed and updated Current Outpatient Medications  Medication Sig Dispense Refill   Alpha-D-Galactosidase (BEANO PO) Take 1 tablet by mouth daily as needed (gas).     azelastine (ASTELIN) 137 MCG/SPRAY nasal spray Place 1 spray into both nostrils 2 (two) times daily. Use in each nostril as directed     budesonide (RHINOCORT AQUA) 32 MCG/ACT nasal spray Place 1 spray into both nostrils daily.     buPROPion (WELLBUTRIN XL) 150 MG 24 hr tablet Take 1 tablet by mouth once daily 90 tablet 0   Calcium Carbonate Antacid (TUMS PO) Take 2 tablets by mouth daily as needed (intergestion).     colestipol (COLESTID) 1 g tablet Take 2 tablets by mouth twice daily 240 tablet 3   cyanocobalamin 1000 MCG tablet Take 1,000 mcg by mouth daily.     desloratadine (CLARINEX) 5 MG tablet Take 5 mg by mouth daily.     EPINEPHrine 0.3 mg/0.3 mL IJ SOAJ  injection Inject 0.3 mg into the muscle as needed for anaphylaxis.  0   escitalopram (LEXAPRO) 20 MG tablet Take 1 tablet by mouth once daily 90 tablet 0   ferrous sulfate 325 (65 FE) MG tablet Take 325 mg by mouth 2 (two) times a week.     hyoscyamine (LEVSIN SL) 0.125 MG SL tablet Place 1 tablet (0.125 mg total) under the tongue every 4 (four) hours as needed. (Patient taking differently: Place 0.125 mg under the tongue every 4 (four) hours as needed for cramping.) 60 tablet 5   lisinopril (ZESTRIL) 40 MG tablet Take 1 tablet by mouth once daily 90 tablet 0   Loperamide HCl (IMODIUM A-D PO) Take 1 capsule by mouth as needed (Diarrhea).     NON FORMULARY Allergy shots every other week     Omega-3 Fatty Acids (FISH OIL) 1200 MG CAPS Take 1,200 mg by mouth daily.     omeprazole (PRILOSEC) 20 MG capsule TAKE 1 CAPSULE BY MOUTH ONCE DAILY BEFORE BREAKFAST 90 capsule 0   pravastatin (PRAVACHOL) 40 MG tablet Take 1 tablet by mouth once daily 90 tablet 0   shark liver oil-cocoa butter (PREPARATION H) 0.25-3-85.5 % suppository Place 1 suppository rectally at bedtime.     Vitamin D, Ergocalciferol, (DRISDOL) 1.25 MG (50000 UNIT) CAPS capsule Take 1 capsule (50,000 Units total) by  mouth every 7 (seven) days. 13 capsule 1   No current facility-administered medications for this visit.     Objective:  BP 120/80   Pulse 81   Temp 97.8 F (36.6 C)   Ht 5\' 7"  (1.702 m)   Wt 199 lb 3.2 oz (90.4 kg)   SpO2 97%   BMI 31.20 kg/m  Gen: NAD, resting comfortably CV: RRR no murmurs rubs or gallops Lungs: CTAB no crackles, wheeze, rhonchi Ext: no edema Skin: warm, dry    Assessment and Plan   #hypertension S: medication: Lisinopril 40 mg BP Readings from Last 3 Encounters:  05/12/23 120/80  11/05/22 120/70  05/04/22 128/70  Home readings #s: 120s/80's. 117/81 this morning A/P: stable- continue current medicines    #hyperlipidemia S: Medication: Pravastatin 40 mg, fish oil  Lab Results   Component Value Date   CHOL 161 12/14/2022   HDL 44.60 12/14/2022   LDLCALC 86 09/02/2020   LDLDIRECT 85.0 12/14/2022   TRIG 253.0 (H) 12/14/2022   CHOLHDL 4 12/14/2022  A/P: reasonable control but have discussed working on lifestyle to try to bring down triglyceride(s) . Weight overall stable- discussed trying even if 5 lbs to work on this by next visit/labs  # Depression S: Medication: Wellbutrin 150 mg, Lexapro 20 mg    05/12/2023    2:26 PM 11/05/2022    1:51 PM 05/04/2022    2:32 PM  Depression screen PHQ 2/9  Decreased Interest 0 0 0  Down, Depressed, Hopeless 0 0 0  PHQ - 2 Score 0 0 0  Altered sleeping 0 0 0  Tired, decreased energy 0 0 0  Change in appetite 0 0 0  Feeling bad or failure about yourself  0 0 0  Trouble concentrating 0 0 0  Moving slowly or fidgety/restless 0 0 0  Suicidal thoughts 0 0 0  PHQ-9 Score 0 0 0  Difficult doing work/chores Not difficult at all Not difficult at all Not difficult at all  A/P: full remisison- continue current medications     # GERD-also follows with Dr. Leone Payor S:Medication: Omeprazole 20 mg -EGD reassuring July 25, 2020 without clear cause of iron or B12 deficiency- later thought to be avms but could not reach on procedure A/P: reflux- stable- continue current medicines     #B12 deficiency (potentially related to B12 use)/iron deficiency S: Medication: Vitamin B12 1000 mcg, twice a week iron -No clear cause in 2021 on EGD or colonoscopy Lab Results  Component Value Date   VITAMINB12 1,174 (H) 12/14/2022  A/P: B12 stable- continue current medicines  Iron deficiency resolved on last check- look at CBC alone today   #Vitamin D deficiency- as low as 10.5 when detected S: Medication: took 26 weeks of high dose vitamin D now on 1000 units per day Last vitamin D Lab Results  Component Value Date   VD25OH 22.71 (L) 12/14/2022  A/P: hopefully improved update vitamin D today. Continue current meds for now  - may bump to  2000 units depending on where things stand  #IBS- patient continues peppermint oil, colestipol, hyoscyamine- rare use), occasional Imodium  -Dad with history of ulcerative coliits- further studies ongoing to determine if he could have crohns -Colonoscopy 2013 and 2021 with 10-year repeat  #allergies- every other weekly injections with Dr. Irena Cords   #screen diabetes- check a1c as also obese  Recommended follow up: Return in about 6 months (around 11/11/2023) for physical or sooner if needed.Schedule b4 you leave.  Lab/Order associations:  ICD-10-CM   1. Primary hypertension  I10     2. Hyperlipidemia, unspecified hyperlipidemia type  E78.5 Comprehensive metabolic panel    CBC with Differential/Platelet    3. Major depressive disorder with single episode, in full remission ([facility])  F32.5     4. Iron deficiency anemia due to chronic blood loss  D50.0     5. Vitamin D deficiency  E55.9 VITAMIN D 25 Hydroxy (Vit-D Deficiency, Fractures)    6. Screening for diabetes mellitus  Z13.1 Hemoglobin A1c    7. Obesity (BMI 30-39.9)  E66.9 Hemoglobin A1c      No orders of the defined types were placed in this encounter.   Return precautions advised.  Tana Conch, MD

## 2023-05-12 NOTE — Patient Instructions (Addendum)
Please stop by lab before you go If you have mychart- we will send your results within 3 business days of [place] receiving them.  If you do not have mychart- we will call you about results within 5 business days of [place] receiving them.  *please also note that you will see labs on mychart as soon as they post. I will later go in and write notes on them- will say "notes from Dr. Durene Cal"   Recommended follow up: Return in about 6 months (around 11/11/2023) for physical or sooner if needed.Schedule b4 you leave.

## 2023-05-13 LAB — CBC WITH DIFFERENTIAL/PLATELET
Basophils Absolute: 0 10*3/uL (ref 0.0–0.1)
Basophils Relative: 0.7 % (ref 0.0–3.0)
Eosinophils Absolute: 0.1 10*3/uL (ref 0.0–0.7)
Eosinophils Relative: 1.6 % (ref 0.0–5.0)
HCT: 42.1 % (ref 39.0–52.0)
Hemoglobin: 13.8 g/dL (ref 13.0–17.0)
Lymphocytes Relative: 30.5 % (ref 12.0–46.0)
Lymphs Abs: 1.6 10*3/uL (ref 0.7–4.0)
MCHC: 32.7 g/dL (ref 30.0–36.0)
MCV: 85.3 fl (ref 78.0–100.0)
Monocytes Absolute: 0.4 10*3/uL (ref 0.1–1.0)
Monocytes Relative: 8.3 % (ref 3.0–12.0)
Neutro Abs: 3.1 10*3/uL (ref 1.4–7.7)
Neutrophils Relative %: 58.9 % (ref 43.0–77.0)
Platelets: 271 10*3/uL (ref 150.0–400.0)
RBC: 4.94 Mil/uL (ref 4.22–5.81)
RDW: 12.9 % (ref 11.5–15.5)
WBC: 5.3 10*3/uL (ref 4.0–10.5)

## 2023-05-13 LAB — COMPREHENSIVE METABOLIC PANEL
ALT: 18 U/L (ref 0–53)
AST: 15 U/L (ref 0–37)
Albumin: 4.9 g/dL (ref 3.5–5.2)
Alkaline Phosphatase: 54 U/L (ref 39–117)
BUN: 10 mg/dL (ref 6–23)
CO2: 28 mEq/L (ref 19–32)
Calcium: 9.5 mg/dL (ref 8.4–10.5)
Chloride: 100 mEq/L (ref 96–112)
Creatinine, Ser: 0.84 mg/dL (ref 0.40–1.50)
GFR: 103.9 mL/min (ref >60.00)
Glucose, Bld: 80 mg/dL (ref 70–99)
Potassium: 4.4 mEq/L (ref 3.5–5.1)
Sodium: 138 mEq/L (ref 135–145)
Total Bilirubin: 1.2 mg/dL (ref 0.2–1.2)
Total Protein: 7.3 g/dL (ref 6.0–8.3)

## 2023-05-13 LAB — HEMOGLOBIN A1C: Hgb A1c MFr Bld: 5.4 % (ref 4.6–6.5)

## 2023-05-13 LAB — VITAMIN D 25 HYDROXY (VIT D DEFICIENCY, FRACTURES): VITD: 29.97 ng/mL — ABNORMAL LOW (ref 30.00–100.00)

## 2023-05-23 DIAGNOSIS — J3081 Allergic rhinitis due to animal (cat) (dog) hair and dander: Secondary | ICD-10-CM | POA: Diagnosis not present

## 2023-05-23 DIAGNOSIS — J3089 Other allergic rhinitis: Secondary | ICD-10-CM | POA: Diagnosis not present

## 2023-05-23 DIAGNOSIS — J301 Allergic rhinitis due to pollen: Secondary | ICD-10-CM | POA: Diagnosis not present

## 2023-05-30 ENCOUNTER — Other Ambulatory Visit: Payer: Self-pay | Admitting: Family Medicine

## 2023-06-06 DIAGNOSIS — J3089 Other allergic rhinitis: Secondary | ICD-10-CM | POA: Diagnosis not present

## 2023-06-06 DIAGNOSIS — J3081 Allergic rhinitis due to animal (cat) (dog) hair and dander: Secondary | ICD-10-CM | POA: Diagnosis not present

## 2023-06-06 DIAGNOSIS — J301 Allergic rhinitis due to pollen: Secondary | ICD-10-CM | POA: Diagnosis not present

## 2023-06-21 ENCOUNTER — Telehealth: Payer: BC Managed Care – PPO | Admitting: Family

## 2023-06-23 ENCOUNTER — Encounter: Payer: Self-pay | Admitting: Family Medicine

## 2023-06-23 ENCOUNTER — Telehealth (INDEPENDENT_AMBULATORY_CARE_PROVIDER_SITE_OTHER): Payer: BC Managed Care – PPO | Admitting: Family Medicine

## 2023-06-23 DIAGNOSIS — U071 COVID-19: Secondary | ICD-10-CM

## 2023-06-23 MED ORDER — NIRMATRELVIR/RITONAVIR (PAXLOVID)TABLET: 3.0000 | ORAL_TABLET | Freq: Two times a day (BID) | ORAL | 0 refills | Status: AC

## 2023-06-23 MED ORDER — BENZONATATE 100 MG PO CAPS: ORAL_CAPSULE | ORAL | 0 refills | Status: DC

## 2023-06-23 NOTE — Patient Instructions (Signed)
HOME CARE TIPS:  -COVID19 testing information: [email or web address]  Most pharmacies also offer testing and home test kits. If the Covid19 test is positive and you desire antiviral treatment, please contact a [facility] pharmacy or schedule a follow up virtual visit through your primary care office or through the [employer].  Other test to treat options: [email or web address]-[email or web address].[email or web address]_[email or web address]  -I sent the medication(s) we discussed to your pharmacy: Meds ordered this encounter  Medications   nirmatrelvir/ritonavir (PAXLOVID) 20 x 150 MG & 10 x 100MG  TABS    Sig: Take 3 tablets by mouth 2 (two) times daily for 5 days. (Take nirmatrelvir 150 mg two tablets twice daily for 5 days and ritonavir 100 mg one tablet twice daily for 5 days) Patient GFR is 100    Dispense:  30 tablet    Refill:  0   benzonatate (TESSALON PERLES) 100 MG capsule    Sig: 1-2 capsules up to twice daily as needed for cough.    Dispense:  30 capsule    Refill:  0     -I sent in the Covid19 treatment or referral you requested per our discussion. Please see the information provided below and discuss further with the pharmacist/treatment team.  -If taking Paxlovid, please review all medications, supplement and over the counter drugs with your pharmacist and ask them to check for any interactions. Please make the following changes to your regular medications while taking Paxlovid: *hold pravastatin, restart 3 days after finishing paxlovid *hold supplements and otc medications unless approved by pharmacist or your doctor while on paxlovid   -there is a chance of rebound illness with covid after improving. This can happen whether or not you take an antiviral treatment. If you become sick again with covid after getting better, please schedule a follow up virtual visit and isolate again.  -can use tylenolif needed for fevers, aches and pains per instructions  -nasal saline sinus  rinses twice daily  -stay hydrated, drink plenty of fluids and eat small healthy meals - avoid dairy   -follow up with your doctor in 2-3 days unless improving and feeling better  -stay home while sick, except to seek medical care. If you have COVID19, you will likely be contagious for 7-10 days. Flu or Influenza is likely contagious for about 7 days. Other respiratory viral infections remain contagious for 5-10+ days depending on the virus and many other factors. Wear a good mask that fits snugly (such as N95 or KN95) if around others to reduce the risk of transmission.  It was nice to meet you today, and I really hope you are feeling better soon. I help [facility] out with telemedicine visits on Tuesdays and Thursdays and am happy to help if you need a follow up virtual visit on those days. Otherwise, if you have any concerns or questions following this visit please schedule a follow up visit with your Primary Care doctor or seek care at a local urgent care clinic to avoid delays in care.    Seek in person care or schedule a follow up video visit promptly if your symptoms worsen, new concerns arise or you are not improving with treatment. Call 911 and/or seek emergency care if your symptoms are severe or life threatening.   See the following link for the most recent information regarding Paxlovid:  [email or web address]   Nirmatrelvir; Ritonavir Tablets What is this medication? NIRMATRELVIR; RITONAVIR (NIR ma TREL vir; ri TOE na veer) treats mild to moderate COVID-19. It may help people who are at  high risk of developing severe illness. This medication works by limiting the spread of the virus in your body. The FDA has allowed the emergency use of this medication. This medicine may be used for other purposes; ask your health care provider or pharmacist if you have questions. COMMON BRAND NAME(S): PAXLOVID What should I tell my care team before I take this medication? They need to know if you  have any of these conditions: Any allergies Any serious illness Kidney disease Liver disease An unusual or allergic reaction to nirmatrelvir, ritonavir, other medications, foods, dyes, or preservatives Pregnant or trying to get pregnant Breast-feeding How should I use this medication? This product contains 2 different medications that are packaged together. For the standard dose, take 2 pink tablets of nirmatrelvir with 1 white tablet of ritonavir (3 tablets total) by mouth with water twice daily. Talk to your care team if you have kidney disease. You may need a different dose. Swallow the tablets whole. You can take it with or without food. If it upsets your stomach, take it with food. Take all of this medication unless your care team tells you to stop it early. Keep taking it even if you think you are better. Talk to your care team about the use of this medication in children. While it may be prescribed for children as young as 12 years for selected conditions, precautions do apply. Overdosage: If you think you have taken too much of this medicine contact a poison control center or emergency room at once. NOTE: This medicine is only for you. Do not share this medicine with others. What if I miss a dose? If you miss a dose, take it as soon as you can unless it is more than 8 hours late. If it is more than 8 hours late, skip the missed dose. Take the next dose at the normal time. Do not take extra or 2 doses at the same time to make up for the missed dose. What may interact with this medication? Do not take this medication with any of the following medications: Alfuzosin Certain medications for anxiety or sleep like midazolam, triazolam Certain medications for cancer like apalutamide, enzalutamide Certain medications for cholesterol like lovastatin, simvastatin Certain medications for irregular heart beat like amiodarone, dronedarone, flecainide, propafenone, quinidine Certain medications for  pain like meperidine, piroxicam Certain medications for psychotic disorders like clozapine, lurasidone, pimozide Certain medications for seizures like carbamazepine, phenobarbital, phenytoin Colchicine Eletriptan Eplerenone Ergot alkaloids like dihydroergotamine, ergonovine, ergotamine, methylergonovine Finerenone Flibanserin Ivabradine Lomitapide Naloxegol Ranolazine Rifampin Sildenafil Silodosin St. Detric's Wort Tolvaptan Ubrogepant Voclosporin This medication may also interact with the following medications: Bedaquiline Birth control pills Bosentan Certain antibiotics like erythromycin or clarithromycin Certain medications for blood pressure like amlodipine, diltiazem, felodipine, nicardipine, nifedipine Certain medications for cancer like abemaciclib, ceritinib, dasatinib, encorafenib, ibrutinib, ivosidenib, neratinib, nilotinib, venetoclax, vinblastine, vincristine Certain medications for cholesterol like atorvastatin, rosuvastatin Certain medications for depression like bupropion, trazodone Certain medications for fungal infections like isavuconazonium, itraconazole, ketoconazole, voriconazole Certain medications for hepatitis C like elbasvir; grazoprevir, dasabuvir; ombitasvir; paritaprevir; ritonavir, glecaprevir; pibrentasvir, sofosbuvir; velpatasvir; voxilaprevir Certain medications for HIV or AIDS Certain medications for irregular heartbeat like lidocaine Certain medications that treat or prevent blood clots like rivaroxaban, warfarin Digoxin Fentanyl Medications that lower your chance of fighting infection like cyclosporine, sirolimus, tacrolimus Methadone Quetiapine Rifabutin Salmeterol Steroid medications like betamethasone, budesonide, ciclesonide, dexamethasone, fluticasone, methylprednisone, mometasone, triamcinolone This list may not describe all possible interactions. Give your health care provider a list of  all the medicines, herbs, non-prescription drugs,  or dietary supplements you use. Also tell them if you smoke, drink alcohol, or use illegal drugs. Some items may interact with your medicine. What should I watch for while using this medication? Your condition will be monitored carefully while you are receiving this medication. Visit your care team for regular checkups. Tell your care team if your symptoms do not start to get better or if they get worse. If you have untreated HIV infection, this medication may lead to some HIV medications not working as well in the future. Birth control may not work properly while you are taking this medication. Talk to your care team about using an extra method of birth control. What side effects may I notice from receiving this medication? Side effects that you should report to your care team as soon as possible: Allergic reactions--skin rash, itching, hives, swelling of the face, lips, tongue, or throat Liver injury--right upper belly pain, loss of appetite, nausea, light-colored stool, dark yellow or brown urine, yellowing skin or eyes, unusual weakness or fatigue Redness, blistering, peeling, or loosening of the skin, including inside the mouth Side effects that usually do not require medical attention (report these to your care team if they continue or are bothersome): Change in taste Diarrhea General discomfort and fatigue Increase in blood pressure Muscle pain Nausea Stomach pain This list may not describe all possible side effects. Call your doctor for medical advice about side effects. You may report side effects to FDA at 1-800-FDA-1088. Where should I keep my medication? Keep out of the reach of children and pets. Store at room temperature between 20 and 25 degrees C (68 and 77 degrees F). Get rid of any unused medication after the expiration date. To get rid of medications that are no longer needed or have expired: Take the medication to a medication take-back program. Check with your pharmacy or  law enforcement to find a location. If you cannot return the medication, check the label or package insert to see if the medication should be thrown out in the garbage or flushed down the toilet. If you are not sure, ask your care team. If it is safe to put it in the trash, take the medication out of the container. Mix the medication with cat litter, dirt, coffee grounds, or other unwanted substance. Seal the mixture in a bag or container. Put it in the trash. NOTE: This sheet is a summary. It may not cover all possible information. If you have questions about this medicine, talk to your doctor, pharmacist, or health care provider.  2022 Elsevier/Gold Standard (2021-08-10 00:00:00)

## 2023-06-23 NOTE — Progress Notes (Signed)
Patient unable to obtain vitals during initial check-in process, stated he will attempt to complete and inform Dr Selena Batten during the visit.

## 2023-06-23 NOTE — Progress Notes (Signed)
Virtual Visit via Video Note  I connected with Garrett [name]  on 06/23/23 at  5:40 PM EDT by a video enabled telemedicine application and verified that I am speaking with the correct person using two identifiers.  Location patient: [place] Location provider:work or home office Persons participating in the virtual visit: patient, provider  I discussed the limitations and requested verbal permission for telemedicine visit. The patient expressed understanding and agreed to proceed.   HPI:  Acute telemedicine visit for Covid19: -Onset: 3 days ago with symptoms, had positive test yesterday -Symptoms include:sore throat, nasal congestion, cough, a little body aches initially - now resolved, feels tired -Denies:fever, SOB, CP, NVD -Has tried:delsym -Pertinent past medical history: see below, GFR 103, reports never had covid before -Pertinent medication allergies:No Known Allergies -COVID-19 vaccine status: last booster 2022 Immunization History  Administered Date(s) Administered   Influenza Split 09/11/2012   Influenza Whole 08/22/2005, 09/04/2007, 08/03/2010, 08/26/2020   Influenza, High Dose Seasonal PF 08/29/2017   Influenza,inj,Quad PF,6+ Mos 08/09/2013, 09/08/2016, 07/19/2017, 07/31/2018, 07/31/2019, 09/20/2022   Influenza-Unspecified 09/05/2014, 08/21/2015   PFIZER(Purple Top)SARS-COV-2 Vaccination 12/06/2019, 12/25/2019, 04/08/2020, 10/13/2020   Pfizer Covid-19 Vaccine Bivalent Booster 52yrs & up 10/28/2021   Td 01/29/2010   Tdap 03/12/2008, 02/29/2020     ROS: See pertinent positives and negatives per HPI.  Past Medical History:  Diagnosis Date   Acne    Allergy    Anxiety    Depression    External hemorrhoids    2004   GERD (gastroesophageal reflux disease)    HLD (hyperlipidemia)    Hypertension    Irritable bowel syndrome    Strabismus    Vitamin D deficiency 03/04/2021    Past Surgical History:  Procedure Laterality Date   COLONOSCOPY  10/16/2012   ENTEROSCOPY N/A  11/30/2021   Procedure: ENTEROSCOPY;  Surgeon: Iva Boop, MD;  Location: [facility];  Service: Endoscopy;  Laterality: N/A;   ESOPHAGOGASTRODUODENOSCOPY  2021   FLEXIBLE SIGMOIDOSCOPY     hemorrhoids-rule out internal bleeding   STRABISMUS SURGERY  2001, 2010   X 2   SUBMUCOSAL TATTOO INJECTION  11/30/2021   Procedure: SUBMUCOSAL TATTOO INJECTION;  Surgeon: Iva Boop, MD;  Location: [facility];  Service: Endoscopy;;   WISDOM TOOTH EXTRACTION       Current Outpatient Medications:    Alpha-D-Galactosidase (BEANO PO), Take 1 tablet by mouth daily as needed (gas)., Disp: , Rfl:    azelastine (ASTELIN) 137 MCG/SPRAY nasal spray, Place 1 spray into both nostrils 2 (two) times daily. Use in each nostril as directed, Disp: , Rfl:    benzonatate (TESSALON PERLES) 100 MG capsule, 1-2 capsules up to twice daily as needed for cough., Disp: 30 capsule, Rfl: 0   budesonide (RHINOCORT AQUA) 32 MCG/ACT nasal spray, Place 1 spray into both nostrils daily., Disp: , Rfl:    buPROPion (WELLBUTRIN XL) 150 MG 24 hr tablet, Take 1 tablet by mouth once daily, Disp: 90 tablet, Rfl: 0   Calcium Carbonate Antacid (TUMS PO), Take 2 tablets by mouth daily as needed (intergestion)., Disp: , Rfl:    Cholecalciferol (VITAMIN D-3 PO), Take 2,000 Units by mouth daily., Disp: , Rfl:    colestipol (COLESTID) 1 g tablet, Take 2 tablets by mouth twice daily, Disp: 240 tablet, Rfl: 3   cyanocobalamin 1000 MCG tablet, Take 1,000 mcg by mouth daily., Disp: , Rfl:    desloratadine (CLARINEX) 5 MG tablet, Take 5 mg by mouth daily., Disp: , Rfl:    EPINEPHrine 0.3 mg/0.3  mL IJ SOAJ injection, Inject 0.3 mg into the muscle as needed for anaphylaxis., Disp: , Rfl: 0   escitalopram (LEXAPRO) 20 MG tablet, Take 1 tablet by mouth once daily, Disp: 90 tablet, Rfl: 0   ferrous sulfate 325 (65 FE) MG tablet, Take 325 mg by mouth 2 (two) times a week., Disp: , Rfl:    hyoscyamine (LEVSIN SL) 0.125 MG SL tablet, Place 1 tablet  (0.125 mg total) under the tongue every 4 (four) hours as needed. (Patient taking differently: Place 0.125 mg under the tongue every 4 (four) hours as needed for cramping.), Disp: 60 tablet, Rfl: 5   lisinopril (ZESTRIL) 40 MG tablet, Take 1 tablet by mouth once daily, Disp: 90 tablet, Rfl: 0   Loperamide HCl (IMODIUM A-D PO), Take 1 capsule by mouth as needed (Diarrhea)., Disp: , Rfl:    nirmatrelvir/ritonavir (PAXLOVID) 20 x 150 MG & 10 x 100MG  TABS, Take 3 tablets by mouth 2 (two) times daily for 5 days. (Take nirmatrelvir 150 mg two tablets twice daily for 5 days and ritonavir 100 mg one tablet twice daily for 5 days) Patient GFR is 100, Disp: 30 tablet, Rfl: 0   NON FORMULARY, Allergy shots every other week, Disp: , Rfl:    Omega-3 Fatty Acids (FISH OIL) 1200 MG CAPS, Take 1,200 mg by mouth daily., Disp: , Rfl:    omeprazole (PRILOSEC) 20 MG capsule, TAKE 1 CAPSULE BY MOUTH ONCE DAILY BEFORE BREAKFAST, Disp: 90 capsule, Rfl: 0   pravastatin (PRAVACHOL) 40 MG tablet, Take 1 tablet by mouth once daily, Disp: 90 tablet, Rfl: 0   shark liver oil-cocoa butter (PREPARATION H) 0.25-3-85.5 % suppository, Place 1 suppository rectally at bedtime., Disp: , Rfl:   EXAM:  VITALS per patient if applicable:  GENERAL: alert, oriented, appears well and in no acute distress  HEENT: atraumatic, conjunttiva clear, no obvious abnormalities on inspection of external nose and ears  NECK: normal movements of the head and neck  LUNGS: on inspection no signs of respiratory distress, breathing rate appears normal, no obvious gross SOB, gasping or wheezing  CV: no obvious cyanosis  MS: moves all visible extremities without noticeable abnormality  PSYCH/NEURO: pleasant and cooperative, no obvious depression or anxiety, speech and thought processing grossly intact  ASSESSMENT AND PLAN:  Discussed the following assessment and plan:  COVID-19  -we discussed limitations of telemedicine visit vs in person  visit, treatment, treatment risks, tranmission/isolation and precautions. Pt is agreeable to treatment via telemedicine at this moment. He is requesting Paxlovid. Discussed unclear benefit in younger healthier pts, but since not up to date on booster and some risk factors reasonable. Discussed risks and interactions. Advised holding statin and restarting 3 days after finishing course. Discussed potential small risk interaction with wellbutrin. Discussed holding supplements and advised to discuss all med/supplements with pharmacist when picks up rx. Tessalon rx for cough.  Work/School slipped offered: declined Advised to seek prompt virtual visit or in person care if worsening, new symptoms arise, or if is not improving with treatment as expected per our conversation of expected course. Discussed options for follow up care. Did let this patient know that I do telemedicine on Tuesdays and Thursdays for [facility] and those are the days I am logged into the system. Advised to schedule follow up visit with PCP, [facility] virtual visits or [facility] if any further questions or concerns to avoid delays in care.   I discussed the assessment and treatment plan with the patient. The patient was provided  an opportunity to ask questions and all were answered. The patient agreed with the plan and demonstrated an understanding of the instructions.     Terressa Koyanagi, DO

## 2023-07-04 ENCOUNTER — Other Ambulatory Visit: Payer: Self-pay | Admitting: Family Medicine

## 2023-07-04 DIAGNOSIS — F3342 Major depressive disorder, recurrent, in full remission: Secondary | ICD-10-CM

## 2023-07-08 ENCOUNTER — Other Ambulatory Visit: Payer: Self-pay | Admitting: Family Medicine

## 2023-08-15 ENCOUNTER — Other Ambulatory Visit: Payer: Self-pay | Admitting: Family Medicine

## 2023-08-28 ENCOUNTER — Other Ambulatory Visit: Payer: Self-pay | Admitting: Family Medicine

## 2023-10-02 ENCOUNTER — Other Ambulatory Visit: Payer: Self-pay | Admitting: Family Medicine

## 2023-10-02 DIAGNOSIS — F3342 Major depressive disorder, recurrent, in full remission: Secondary | ICD-10-CM

## 2023-11-17 ENCOUNTER — Other Ambulatory Visit: Payer: Self-pay | Admitting: Family Medicine

## 2023-11-17 DIAGNOSIS — F3342 Major depressive disorder, recurrent, in full remission: Secondary | ICD-10-CM

## 2023-11-21 ENCOUNTER — Ambulatory Visit (INDEPENDENT_AMBULATORY_CARE_PROVIDER_SITE_OTHER): Payer: BC Managed Care – PPO | Admitting: Family Medicine

## 2023-11-21 ENCOUNTER — Encounter: Payer: Self-pay | Admitting: Family Medicine

## 2023-11-21 VITALS — BP 122/68 | HR 86 | Temp 97.4°F | Ht 67.0 in | Wt 199.4 lb

## 2023-11-21 DIAGNOSIS — E785 Hyperlipidemia, unspecified: Secondary | ICD-10-CM | POA: Diagnosis not present

## 2023-11-21 DIAGNOSIS — F3342 Major depressive disorder, recurrent, in full remission: Secondary | ICD-10-CM | POA: Diagnosis not present

## 2023-11-21 DIAGNOSIS — Z0000 Encounter for general adult medical examination without abnormal findings: Secondary | ICD-10-CM | POA: Diagnosis not present

## 2023-11-21 DIAGNOSIS — Z6831 Body mass index (BMI) 31.0-31.9, adult: Secondary | ICD-10-CM

## 2023-11-21 DIAGNOSIS — E66811 Obesity, class 1: Secondary | ICD-10-CM

## 2023-11-21 DIAGNOSIS — E538 Deficiency of other specified B group vitamins: Secondary | ICD-10-CM

## 2023-11-21 DIAGNOSIS — E559 Vitamin D deficiency, unspecified: Secondary | ICD-10-CM

## 2023-11-21 DIAGNOSIS — Z131 Encounter for screening for diabetes mellitus: Secondary | ICD-10-CM

## 2023-11-21 DIAGNOSIS — E6609 Other obesity due to excess calories: Secondary | ICD-10-CM

## 2023-11-21 MED ORDER — BUPROPION HCL ER (XL) 150 MG PO TB24: 150.0000 mg | ORAL_TABLET | Freq: Every day | ORAL | 0 refills | Status: DC

## 2023-11-21 MED ORDER — BUPROPION HCL ER (XL) 150 MG PO TB24: 150.0000 mg | ORAL_TABLET | Freq: Every day | ORAL | 3 refills | Status: DC

## 2023-11-21 MED ORDER — PRAVASTATIN SODIUM 40 MG PO TABS: 40.0000 mg | ORAL_TABLET | Freq: Every day | ORAL | 0 refills | Status: DC

## 2023-11-21 MED ORDER — LISINOPRIL 40 MG PO TABS: 40.0000 mg | ORAL_TABLET | Freq: Every day | ORAL | 3 refills | Status: DC

## 2023-11-21 MED ORDER — ESCITALOPRAM OXALATE 20 MG PO TABS: 20.0000 mg | ORAL_TABLET | Freq: Every day | ORAL | 0 refills | Status: DC

## 2023-11-21 MED ORDER — PRAVASTATIN SODIUM 40 MG PO TABS: 40.0000 mg | ORAL_TABLET | Freq: Every day | ORAL | 3 refills | Status: DC

## 2023-11-21 MED ORDER — LISINOPRIL 40 MG PO TABS: 40.0000 mg | ORAL_TABLET | Freq: Every day | ORAL | 0 refills | Status: DC

## 2023-11-21 MED ORDER — ESCITALOPRAM OXALATE 20 MG PO TABS: 20.0000 mg | ORAL_TABLET | Freq: Every day | ORAL | 3 refills | Status: DC

## 2023-11-21 NOTE — Patient Instructions (Signed)
Schedule a lab visit at the check out desk within 2 weeks. Return for future fasting labs meaning nothing but water after midnight please. Ok to take your medications with water.   As things settle down- working on diet/exercise would be a great focus choice  Recommended follow up: Return in about 6 months (around 05/21/2024) for followup or sooner if needed.Schedule b4 you leave.

## 2023-11-21 NOTE — Progress Notes (Signed)
Phone: [phone]    Subjective:  Patient presents today for their annual physical. Chief complaint-noted.   See problem oriented charting- ROS- full  review of systems was completed and negative  except for: grieving loss of father  The following were reviewed and entered/updated in epic: Past Medical History:  Diagnosis Date   Acne    Allergy    Anemia    Anxiety    Depression    External hemorrhoids    2004   GERD (gastroesophageal reflux disease)    HLD (hyperlipidemia)    Hypertension    Irritable bowel syndrome    Strabismus    Vitamin D deficiency 03/04/2021   Patient Active Problem List   Diagnosis Date Noted   Angiodysplasia of intestine 09/25/2021    Priority: Medium    Major depressive disorder with single episode, in full remission (HCC) 04/16/2021    Priority: Medium    Vitamin D deficiency 03/04/2021    Priority: Medium    Vitamin B12 deficiency 03/01/2020    Priority: Medium    Hypertension 02/19/2014    Priority: Medium    IBS (irritable bowel syndrome) diarrhea predominant 08/14/2012    Priority: Medium    Allergic rhinitis 02/21/2012    Priority: Medium    Hyperlipidemia 09/05/2007    Priority: Medium    Depression 05/25/2007    Priority: Medium    GERD (gastroesophageal reflux disease) 11/29/2012    Priority: Low   Iron deficiency anemia due to chronic blood loss    Past Surgical History:  Procedure Laterality Date   COLONOSCOPY  10/16/2012   ENTEROSCOPY N/A 11/30/2021   Procedure: ENTEROSCOPY;  Surgeon: Iva Boop, MD;  Location: [facility];  Service: Endoscopy;  Laterality: N/A;   ESOPHAGOGASTRODUODENOSCOPY  2021   FLEXIBLE SIGMOIDOSCOPY     hemorrhoids-rule out internal bleeding   STRABISMUS SURGERY  2001, 2010   X 2   SUBMUCOSAL TATTOO INJECTION  11/30/2021   Procedure: SUBMUCOSAL TATTOO INJECTION;  Surgeon: Iva Boop, MD;  Location: [facility];  Service: Endoscopy;;   WISDOM TOOTH EXTRACTION      Family History   Problem Relation Age of Onset   Breast cancer Mother    Hypertension Mother    Hyperlipidemia Mother    Cancer Mother    Prostate cancer Father        late 7s   Ulcerative colitis Father        died age 43   Hypertension Father    Hyperlipidemia Father    Asthma Father    Colon cancer Neg Hx    Rectal cancer Neg Hx    Stomach cancer Neg Hx    Esophageal cancer Neg Hx     Medications- reviewed and updated Current Outpatient Medications  Medication Sig Dispense Refill   Alpha-D-Galactosidase (BEANO PO) Take 1 tablet by mouth daily as needed (gas).     azelastine (ASTELIN) 137 MCG/SPRAY nasal spray Place 1 spray into both nostrils 2 (two) times daily. Use in each nostril as directed     budesonide (RHINOCORT AQUA) 32 MCG/ACT nasal spray Place 1 spray into both nostrils daily.     Calcium Carbonate Antacid (TUMS PO) Take 2 tablets by mouth daily as needed (intergestion).     Cholecalciferol (VITAMIN D-3 PO) Take 2,000 Units by mouth daily.     colestipol (COLESTID) 1 g tablet Take 2 tablets by mouth twice daily 240 tablet 0   cyanocobalamin 1000 MCG tablet Take 1,000 mcg by mouth daily.  desloratadine (CLARINEX) 5 MG tablet Take 5 mg by mouth daily.     EPINEPHrine 0.3 mg/0.3 mL IJ SOAJ injection Inject 0.3 mg into the muscle as needed for anaphylaxis.  0   ferrous sulfate 325 (65 FE) MG tablet Take 325 mg by mouth 2 (two) times a week.     hyoscyamine (LEVSIN SL) 0.125 MG SL tablet Place 1 tablet (0.125 mg total) under the tongue every 4 (four) hours as needed. (Patient taking differently: Place 0.125 mg under the tongue every 4 (four) hours as needed for cramping.) 60 tablet 5   Loperamide HCl (IMODIUM A-D PO) Take 1 capsule by mouth as needed (Diarrhea).     NON FORMULARY Allergy shots every other week     Omega-3 Fatty Acids (FISH OIL) 1200 MG CAPS Take 1,200 mg by mouth daily.     omeprazole (PRILOSEC) 20 MG capsule TAKE 1 CAPSULE BY MOUTH ONCE DAILY BEFORE BREAKFAST 90  capsule 0   shark liver oil-cocoa butter (PREPARATION H) 0.25-3-85.5 % suppository Place 1 suppository rectally at bedtime.     buPROPion (WELLBUTRIN XL) 150 MG 24 hr tablet Take 1 tablet (150 mg total) by mouth daily. 90 tablet 3   escitalopram (LEXAPRO) 20 MG tablet Take 1 tablet (20 mg total) by mouth daily. 90 tablet 3   lisinopril (ZESTRIL) 40 MG tablet Take 1 tablet (40 mg total) by mouth daily. 90 tablet 3   pravastatin (PRAVACHOL) 40 MG tablet Take 1 tablet (40 mg total) by mouth daily. 90 tablet 3   No current facility-administered medications for this visit.    Allergies-reviewed and updated No Known Allergies  Social History   Social History Narrative   Single. Live with parents.       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      Hobbies: reading, time at mall, movies      Habits never smoker 2 caffeinated beverages daily no alcohol no drug use no other tobacco      Objective:  BP 122/68 (BP Location: Left Arm, Patient Position: Sitting, Cuff Size: Normal)   Pulse 86   Temp (!) 97.4 F (36.3 C) (Temporal)   Ht 5\' 7"  (1.702 m)   Wt 199 lb 6.4 oz (90.4 kg)   SpO2 95%   BMI 31.23 kg/m  Gen: NAD, resting comfortably HEENT: Mucous membranes are moist. Oropharynx normal Neck: no thyromegaly CV: RRR no murmurs rubs or gallops Lungs: CTAB no crackles, wheeze, rhonchi Abdomen: soft/nontender/nondistended/normal bowel sounds. No rebound or guarding.  Ext: no edema Skin: warm, dry Neuro: grossly normal, moves all extremities, PERRLA Declines genitourinary and rectal exam    Assessment and Plan:  47 y.o. male presenting for annual physical.  Health Maintenance counseling: 1. Anticipatory guidance: Patient counseled regarding regular dental exams -q6 months, eye exams - generally yearly,  avoiding smoking and second hand smoke , limiting alcohol to 2 beverages per day- 2 a month, no  illicit drugs.   2. Risk factor reduction:  Advised patient of need for regular exercise and diet rich and fruits and vegetables to reduce risk of heart attack and stroke.  Exercise- down with loss of father- some pacing but wants to improve overall.  Diet/weight management-stable from last year- diet tougher due to IBS limitations.  Wt Readings from Last 3 Encounters:  11/21/23 199 lb 6.4 oz (90.4 kg)  05/12/23 199 lb 3.2 oz (90.4 kg)  11/05/22 199 lb 9.6 oz (90.5 kg)  3. Immunizations/screenings/ancillary studies- holding off on COVID vaccines for now but may do in future- he had case in july Immunization History  Administered Date(s) Administered   Influenza Split 09/11/2012   Influenza Whole 08/22/2005, 09/04/2007, 08/03/2010, 08/26/2020   Influenza, High Dose Seasonal PF 08/29/2017   Influenza,inj,Quad PF,6+ Mos 08/09/2013, 09/08/2016, 07/19/2017, 07/31/2018, 07/31/2019, 09/20/2022   Influenza-Unspecified 09/05/2014, 08/21/2015, 08/14/2021, 09/15/2023   PFIZER(Purple Top)SARS-COV-2 Vaccination 12/06/2019, 12/25/2019, 04/08/2020, 10/13/2020   Pfizer Covid-19 Vaccine Bivalent Booster 49yrs & up 10/28/2021   Td 01/29/2010   Tdap 03/12/2008, 02/29/2020   4. Prostate cancer screening- father with prostate cancer in 24s- wants to start at age 67 perhaps  5. Colon cancer screening - 07/25/20 with 10 year follow up- done due to anemia issues 6. Skin cancer screening/prevention- about every 5 year dermatology visits- due this year. advised regular sunscreen use. Denies worrisome, changing, or new skin lesions.  7. Testicular cancer screening- advised monthly self exams  8. STD screening- patient opts opts out - not dating 79. Smoking associated screening- never smoker  Status of chronic or acute concerns   #hypertension S: medication: Lisinopril 40 mg  BP Readings from Last 3 Encounters:  11/21/23 122/68  05/12/23 120/80  11/05/22 120/70  A/P: stable- continue current medicines     #hyperlipidemia S: Medication: Pravastatin 40 mg, fish oil- but off this lately  Lab Results  Component Value Date   CHOL 161 12/14/2022   HDL 44.60 12/14/2022   LDLCALC 86 09/02/2020   LDLDIRECT 85.0 12/14/2022   TRIG 253.0 (H) 12/14/2022   CHOLHDL 4 12/14/2022  A/P: close to ideal last check- can update today  # Depression S: Medication: Wellbutrin 150 mg, Lexapro 20 mg    11/21/2023    2:00 PM 05/12/2023    2:26 PM 11/05/2022    1:51 PM  Depression screen PHQ 2/9  Decreased Interest 1 0 0  Down, Depressed, Hopeless 1 0 0  PHQ - 2 Score 2 0 0  Altered sleeping 1 0 0  Tired, decreased energy 1 0 0  Change in appetite 1 0 0  Feeling bad or failure about yourself  1 0 0  Trouble concentrating 1 0 0  Moving slowly or fidgety/restless 0 0 0  Suicidal thoughts 0 0 0  PHQ-9 Score 7 0 0  Difficult doing work/chores Somewhat difficult Not difficult at all Not difficult at all  A/P: scores up some with grieving which is appropriate- continue to monitor and continue current medications     # GERD-also follows with Dr. Leone Payor S:Medication: Omeprazole 20 mg OTC (available over the counter without a prescription)  -EGD reassuring July 25, 2020 without clear cause of iron or B12 deficiency- later thought to be avms but could not reach on procedure A/P: has been doing well lately- continue to monitor     #B12 deficiency (potentially related to B12 use)/iron deficiency S: Medication: Vitamin B12 1000 mcg, twice a week iron -No clear cause in 2021 on EGD or colonoscopy A/P: off B12 for last month- check on labs to see if needs to restart   #Vitamin D deficiency- as low as 10.5 when detected S: Medication: off in last month Last vitamin D Lab Results  Component Value Date   VD25OH 29.97 (L) 05/12/2023  A/P: hopefully stable- update vitamin D  today. Continue current meds for now    #allergies- every other weekly injections with Dr. Irena Cords- held off in last month  caring  for dads but allergies worse- he has tried to take it easy.   -has epi pen auvi-Q  #IBS- patient continues peppermint oil, colestipol, hyoscyamine- rare use, occasional Imodium  -Dad with history of ulcerative coliits- his workup was negative though -Colonoscopy 2013 and 2021 with 10-year repeat -after loss of father had flare and had to do brat diet and that did help but not at 100% yet but recovering. He tried cutting out supplements- fish oil, D and B12 to see if that would help- but may add back in. Off for a month  Recommended follow up: Return in about 6 months (around 05/21/2024) for followup or sooner if needed.Schedule b4 you leave.  Lab/Order associations:will come back fasting   ICD-10-CM   1. Preventative health care  Z00.00     2. Recurrent major depressive disorder, in full remission (HCC)  F33.42     3. Hyperlipidemia, unspecified hyperlipidemia type  E78.5 Comprehensive metabolic panel    CBC with Differential/Platelet    Lipid panel    4. Vitamin B12 deficiency  E53.8 Vitamin B12    5. Vitamin D deficiency  E55.9 VITAMIN D 25 Hydroxy (Vit-D Deficiency, Fractures)    6. Screening for diabetes mellitus  Z13.1 Hemoglobin A1c    7. Class 1 obesity due to excess calories with serious comorbidity and body mass index (BMI) of 31.0 to 31.9 in adult  E66.811 Hemoglobin A1c   E66.09    Z68.31       Meds ordered this encounter  Medications   DISCONTD: lisinopril (ZESTRIL) 40 MG tablet    Sig: Take 1 tablet (40 mg total) by mouth daily.    Dispense:  90 tablet    Refill:  0   DISCONTD: pravastatin (PRAVACHOL) 40 MG tablet    Sig: Take 1 tablet (40 mg total) by mouth daily.    Dispense:  90 tablet    Refill:  0   DISCONTD: escitalopram (LEXAPRO) 20 MG tablet    Sig: Take 1 tablet (20 mg total) by mouth daily.    Dispense:  90 tablet    Refill:  0   DISCONTD: buPROPion (WELLBUTRIN XL) 150 MG 24 hr tablet    Sig: Take 1 tablet (150 mg total) by mouth daily.     Dispense:  90 tablet    Refill:  0   buPROPion (WELLBUTRIN XL) 150 MG 24 hr tablet    Sig: Take 1 tablet (150 mg total) by mouth daily.    Dispense:  90 tablet    Refill:  3   escitalopram (LEXAPRO) 20 MG tablet    Sig: Take 1 tablet (20 mg total) by mouth daily.    Dispense:  90 tablet    Refill:  3   lisinopril (ZESTRIL) 40 MG tablet    Sig: Take 1 tablet (40 mg total) by mouth daily.    Dispense:  90 tablet    Refill:  3   pravastatin (PRAVACHOL) 40 MG tablet    Sig: Take 1 tablet (40 mg total) by mouth daily.    Dispense:  90 tablet    Refill:  3    Return precautions advised.   Tana Conch, MD

## 2023-12-05 ENCOUNTER — Other Ambulatory Visit: Payer: BC Managed Care – PPO

## 2023-12-08 DIAGNOSIS — J3081 Allergic rhinitis due to animal (cat) (dog) hair and dander: Secondary | ICD-10-CM | POA: Diagnosis not present

## 2023-12-08 DIAGNOSIS — J301 Allergic rhinitis due to pollen: Secondary | ICD-10-CM | POA: Diagnosis not present

## 2023-12-08 DIAGNOSIS — J019 Acute sinusitis, unspecified: Secondary | ICD-10-CM | POA: Diagnosis not present

## 2023-12-08 DIAGNOSIS — J3089 Other allergic rhinitis: Secondary | ICD-10-CM | POA: Diagnosis not present

## 2023-12-12 ENCOUNTER — Encounter: Payer: Self-pay | Admitting: Family Medicine

## 2023-12-12 ENCOUNTER — Other Ambulatory Visit (INDEPENDENT_AMBULATORY_CARE_PROVIDER_SITE_OTHER): Payer: BC Managed Care – PPO

## 2023-12-12 ENCOUNTER — Other Ambulatory Visit: Payer: Self-pay | Admitting: Family Medicine

## 2023-12-12 ENCOUNTER — Other Ambulatory Visit: Payer: BC Managed Care – PPO

## 2023-12-12 DIAGNOSIS — Z6831 Body mass index (BMI) 31.0-31.9, adult: Secondary | ICD-10-CM | POA: Diagnosis not present

## 2023-12-12 DIAGNOSIS — E559 Vitamin D deficiency, unspecified: Secondary | ICD-10-CM

## 2023-12-12 DIAGNOSIS — Z131 Encounter for screening for diabetes mellitus: Secondary | ICD-10-CM | POA: Diagnosis not present

## 2023-12-12 DIAGNOSIS — E538 Deficiency of other specified B group vitamins: Secondary | ICD-10-CM

## 2023-12-12 DIAGNOSIS — E6609 Other obesity due to excess calories: Secondary | ICD-10-CM | POA: Diagnosis not present

## 2023-12-12 DIAGNOSIS — E66811 Obesity, class 1: Secondary | ICD-10-CM

## 2023-12-12 DIAGNOSIS — E785 Hyperlipidemia, unspecified: Secondary | ICD-10-CM

## 2023-12-12 LAB — VITAMIN B12: Vitamin B-12: 795 pg/mL (ref 211–911)

## 2023-12-12 LAB — COMPREHENSIVE METABOLIC PANEL
ALT: 16 U/L (ref 0–53)
AST: 14 U/L (ref 0–37)
Albumin: 5.1 g/dL (ref 3.5–5.2)
Alkaline Phosphatase: 57 U/L (ref 39–117)
BUN: 13 mg/dL (ref 6–23)
CO2: 28 meq/L (ref 19–32)
Calcium: 9.5 mg/dL (ref 8.4–10.5)
Chloride: 98 meq/L (ref 96–112)
Creatinine, Ser: 0.84 mg/dL (ref 0.40–1.50)
GFR: 103.47 mL/min (ref >60.00)
Glucose, Bld: 82 mg/dL (ref 70–99)
Potassium: 3.7 meq/L (ref 3.5–5.1)
Sodium: 137 meq/L (ref 135–145)
Total Bilirubin: 1.1 mg/dL (ref 0.2–1.2)
Total Protein: 7.4 g/dL (ref 6.0–8.3)

## 2023-12-12 LAB — CBC WITH DIFFERENTIAL/PLATELET
Basophils Absolute: 0 10*3/uL (ref 0.0–0.1)
Basophils Relative: 0.4 % (ref 0.0–3.0)
Eosinophils Absolute: 0.1 10*3/uL (ref 0.0–0.7)
Eosinophils Relative: 2 % (ref 0.0–5.0)
HCT: 41.3 % (ref 39.0–52.0)
Hemoglobin: 13.8 g/dL (ref 13.0–17.0)
Lymphocytes Relative: 37.9 % (ref 12.0–46.0)
Lymphs Abs: 2.2 10*3/uL (ref 0.7–4.0)
MCHC: 33.4 g/dL (ref 30.0–36.0)
MCV: 83.7 fL (ref 78.0–100.0)
Monocytes Absolute: 0.4 10*3/uL (ref 0.1–1.0)
Monocytes Relative: 7.6 % (ref 3.0–12.0)
Neutro Abs: 3.1 10*3/uL (ref 1.4–7.7)
Neutrophils Relative %: 52.1 % (ref 43.0–77.0)
Platelets: 227 10*3/uL (ref 150.0–400.0)
RBC: 4.94 Mil/uL (ref 4.22–5.81)
RDW: 13.9 % (ref 11.5–15.5)
WBC: 5.9 10*3/uL (ref 4.0–10.5)

## 2023-12-12 LAB — LIPID PANEL
Cholesterol: 169 mg/dL (ref 0–200)
HDL: 47.5 mg/dL (ref >39.00)
LDL Cholesterol: 72 mg/dL (ref 0–99)
NonHDL: 121.16
Total CHOL/HDL Ratio: 4
Triglycerides: 245 mg/dL — ABNORMAL HIGH (ref 0.0–149.0)
VLDL: 49 mg/dL — ABNORMAL HIGH (ref 0.0–40.0)

## 2023-12-12 LAB — VITAMIN D 25 HYDROXY (VIT D DEFICIENCY, FRACTURES): VITD: 26.05 ng/mL — ABNORMAL LOW (ref 30.00–100.00)

## 2023-12-12 LAB — HEMOGLOBIN A1C: Hgb A1c MFr Bld: 5.3 % (ref 4.6–6.5)

## 2023-12-12 MED ORDER — VITAMIN D (ERGOCALCIFEROL) 1.25 MG (50000 UNIT) PO CAPS: 50000.0000 [IU] | ORAL_CAPSULE | ORAL | 1 refills | Status: DC

## 2023-12-20 ENCOUNTER — Other Ambulatory Visit: Payer: Self-pay | Admitting: Family Medicine

## 2024-01-26 DIAGNOSIS — J3081 Allergic rhinitis due to animal (cat) (dog) hair and dander: Secondary | ICD-10-CM | POA: Diagnosis not present

## 2024-01-26 DIAGNOSIS — J301 Allergic rhinitis due to pollen: Secondary | ICD-10-CM | POA: Diagnosis not present

## 2024-01-27 DIAGNOSIS — J3089 Other allergic rhinitis: Secondary | ICD-10-CM | POA: Diagnosis not present

## 2024-01-30 ENCOUNTER — Other Ambulatory Visit: Payer: Self-pay | Admitting: Family Medicine

## 2024-02-03 DIAGNOSIS — J3089 Other allergic rhinitis: Secondary | ICD-10-CM | POA: Diagnosis not present

## 2024-02-03 DIAGNOSIS — J301 Allergic rhinitis due to pollen: Secondary | ICD-10-CM | POA: Diagnosis not present

## 2024-02-03 DIAGNOSIS — J3081 Allergic rhinitis due to animal (cat) (dog) hair and dander: Secondary | ICD-10-CM | POA: Diagnosis not present

## 2024-02-07 DIAGNOSIS — J3081 Allergic rhinitis due to animal (cat) (dog) hair and dander: Secondary | ICD-10-CM | POA: Diagnosis not present

## 2024-02-07 DIAGNOSIS — J301 Allergic rhinitis due to pollen: Secondary | ICD-10-CM | POA: Diagnosis not present

## 2024-02-07 DIAGNOSIS — J3089 Other allergic rhinitis: Secondary | ICD-10-CM | POA: Diagnosis not present

## 2024-02-10 DIAGNOSIS — J3089 Other allergic rhinitis: Secondary | ICD-10-CM | POA: Diagnosis not present

## 2024-02-10 DIAGNOSIS — J301 Allergic rhinitis due to pollen: Secondary | ICD-10-CM | POA: Diagnosis not present

## 2024-02-10 DIAGNOSIS — J3081 Allergic rhinitis due to animal (cat) (dog) hair and dander: Secondary | ICD-10-CM | POA: Diagnosis not present

## 2024-02-13 DIAGNOSIS — J3081 Allergic rhinitis due to animal (cat) (dog) hair and dander: Secondary | ICD-10-CM | POA: Diagnosis not present

## 2024-02-13 DIAGNOSIS — J301 Allergic rhinitis due to pollen: Secondary | ICD-10-CM | POA: Diagnosis not present

## 2024-02-13 DIAGNOSIS — J3089 Other allergic rhinitis: Secondary | ICD-10-CM | POA: Diagnosis not present

## 2024-02-15 DIAGNOSIS — J3089 Other allergic rhinitis: Secondary | ICD-10-CM | POA: Diagnosis not present

## 2024-02-15 DIAGNOSIS — J3081 Allergic rhinitis due to animal (cat) (dog) hair and dander: Secondary | ICD-10-CM | POA: Diagnosis not present

## 2024-02-15 DIAGNOSIS — J301 Allergic rhinitis due to pollen: Secondary | ICD-10-CM | POA: Diagnosis not present

## 2024-02-21 DIAGNOSIS — J3081 Allergic rhinitis due to animal (cat) (dog) hair and dander: Secondary | ICD-10-CM | POA: Diagnosis not present

## 2024-02-21 DIAGNOSIS — J3089 Other allergic rhinitis: Secondary | ICD-10-CM | POA: Diagnosis not present

## 2024-02-21 DIAGNOSIS — J301 Allergic rhinitis due to pollen: Secondary | ICD-10-CM | POA: Diagnosis not present

## 2024-02-24 DIAGNOSIS — J3089 Other allergic rhinitis: Secondary | ICD-10-CM | POA: Diagnosis not present

## 2024-02-24 DIAGNOSIS — J301 Allergic rhinitis due to pollen: Secondary | ICD-10-CM | POA: Diagnosis not present

## 2024-02-24 DIAGNOSIS — J3081 Allergic rhinitis due to animal (cat) (dog) hair and dander: Secondary | ICD-10-CM | POA: Diagnosis not present

## 2024-02-28 DIAGNOSIS — J301 Allergic rhinitis due to pollen: Secondary | ICD-10-CM | POA: Diagnosis not present

## 2024-02-28 DIAGNOSIS — J3081 Allergic rhinitis due to animal (cat) (dog) hair and dander: Secondary | ICD-10-CM | POA: Diagnosis not present

## 2024-02-28 DIAGNOSIS — J3089 Other allergic rhinitis: Secondary | ICD-10-CM | POA: Diagnosis not present

## 2024-03-01 DIAGNOSIS — J301 Allergic rhinitis due to pollen: Secondary | ICD-10-CM | POA: Diagnosis not present

## 2024-03-01 DIAGNOSIS — J3081 Allergic rhinitis due to animal (cat) (dog) hair and dander: Secondary | ICD-10-CM | POA: Diagnosis not present

## 2024-03-01 DIAGNOSIS — J3089 Other allergic rhinitis: Secondary | ICD-10-CM | POA: Diagnosis not present

## 2024-03-06 DIAGNOSIS — J3081 Allergic rhinitis due to animal (cat) (dog) hair and dander: Secondary | ICD-10-CM | POA: Diagnosis not present

## 2024-03-06 DIAGNOSIS — J3089 Other allergic rhinitis: Secondary | ICD-10-CM | POA: Diagnosis not present

## 2024-03-06 DIAGNOSIS — J301 Allergic rhinitis due to pollen: Secondary | ICD-10-CM | POA: Diagnosis not present

## 2024-03-08 DIAGNOSIS — J301 Allergic rhinitis due to pollen: Secondary | ICD-10-CM | POA: Diagnosis not present

## 2024-03-08 DIAGNOSIS — J3089 Other allergic rhinitis: Secondary | ICD-10-CM | POA: Diagnosis not present

## 2024-03-08 DIAGNOSIS — J3081 Allergic rhinitis due to animal (cat) (dog) hair and dander: Secondary | ICD-10-CM | POA: Diagnosis not present

## 2024-03-12 DIAGNOSIS — J3081 Allergic rhinitis due to animal (cat) (dog) hair and dander: Secondary | ICD-10-CM | POA: Diagnosis not present

## 2024-03-12 DIAGNOSIS — J3089 Other allergic rhinitis: Secondary | ICD-10-CM | POA: Diagnosis not present

## 2024-03-12 DIAGNOSIS — J301 Allergic rhinitis due to pollen: Secondary | ICD-10-CM | POA: Diagnosis not present

## 2024-03-15 DIAGNOSIS — J301 Allergic rhinitis due to pollen: Secondary | ICD-10-CM | POA: Diagnosis not present

## 2024-03-15 DIAGNOSIS — J3081 Allergic rhinitis due to animal (cat) (dog) hair and dander: Secondary | ICD-10-CM | POA: Diagnosis not present

## 2024-03-15 DIAGNOSIS — J3089 Other allergic rhinitis: Secondary | ICD-10-CM | POA: Diagnosis not present

## 2024-03-19 DIAGNOSIS — J301 Allergic rhinitis due to pollen: Secondary | ICD-10-CM | POA: Diagnosis not present

## 2024-03-19 DIAGNOSIS — J3081 Allergic rhinitis due to animal (cat) (dog) hair and dander: Secondary | ICD-10-CM | POA: Diagnosis not present

## 2024-03-19 DIAGNOSIS — J3089 Other allergic rhinitis: Secondary | ICD-10-CM | POA: Diagnosis not present

## 2024-03-23 DIAGNOSIS — J301 Allergic rhinitis due to pollen: Secondary | ICD-10-CM | POA: Diagnosis not present

## 2024-03-23 DIAGNOSIS — J3089 Other allergic rhinitis: Secondary | ICD-10-CM | POA: Diagnosis not present

## 2024-03-23 DIAGNOSIS — J3081 Allergic rhinitis due to animal (cat) (dog) hair and dander: Secondary | ICD-10-CM | POA: Diagnosis not present

## 2024-03-26 DIAGNOSIS — J3081 Allergic rhinitis due to animal (cat) (dog) hair and dander: Secondary | ICD-10-CM | POA: Diagnosis not present

## 2024-03-26 DIAGNOSIS — J3089 Other allergic rhinitis: Secondary | ICD-10-CM | POA: Diagnosis not present

## 2024-03-26 DIAGNOSIS — J301 Allergic rhinitis due to pollen: Secondary | ICD-10-CM | POA: Diagnosis not present

## 2024-04-02 DIAGNOSIS — J3089 Other allergic rhinitis: Secondary | ICD-10-CM | POA: Diagnosis not present

## 2024-04-02 DIAGNOSIS — J301 Allergic rhinitis due to pollen: Secondary | ICD-10-CM | POA: Diagnosis not present

## 2024-04-02 DIAGNOSIS — J3081 Allergic rhinitis due to animal (cat) (dog) hair and dander: Secondary | ICD-10-CM | POA: Diagnosis not present

## 2024-04-09 DIAGNOSIS — J3081 Allergic rhinitis due to animal (cat) (dog) hair and dander: Secondary | ICD-10-CM | POA: Diagnosis not present

## 2024-04-09 DIAGNOSIS — J3089 Other allergic rhinitis: Secondary | ICD-10-CM | POA: Diagnosis not present

## 2024-04-09 DIAGNOSIS — J301 Allergic rhinitis due to pollen: Secondary | ICD-10-CM | POA: Diagnosis not present

## 2024-04-13 ENCOUNTER — Other Ambulatory Visit: Payer: Self-pay | Admitting: Family Medicine

## 2024-04-18 DIAGNOSIS — J3089 Other allergic rhinitis: Secondary | ICD-10-CM | POA: Diagnosis not present

## 2024-04-18 DIAGNOSIS — J301 Allergic rhinitis due to pollen: Secondary | ICD-10-CM | POA: Diagnosis not present

## 2024-04-18 DIAGNOSIS — J3081 Allergic rhinitis due to animal (cat) (dog) hair and dander: Secondary | ICD-10-CM | POA: Diagnosis not present

## 2024-04-23 DIAGNOSIS — J301 Allergic rhinitis due to pollen: Secondary | ICD-10-CM | POA: Diagnosis not present

## 2024-04-23 DIAGNOSIS — J3081 Allergic rhinitis due to animal (cat) (dog) hair and dander: Secondary | ICD-10-CM | POA: Diagnosis not present

## 2024-04-23 DIAGNOSIS — J3089 Other allergic rhinitis: Secondary | ICD-10-CM | POA: Diagnosis not present

## 2024-04-30 DIAGNOSIS — J301 Allergic rhinitis due to pollen: Secondary | ICD-10-CM | POA: Diagnosis not present

## 2024-04-30 DIAGNOSIS — J3089 Other allergic rhinitis: Secondary | ICD-10-CM | POA: Diagnosis not present

## 2024-04-30 DIAGNOSIS — J3081 Allergic rhinitis due to animal (cat) (dog) hair and dander: Secondary | ICD-10-CM | POA: Diagnosis not present

## 2024-05-08 DIAGNOSIS — J301 Allergic rhinitis due to pollen: Secondary | ICD-10-CM | POA: Diagnosis not present

## 2024-05-08 DIAGNOSIS — J3081 Allergic rhinitis due to animal (cat) (dog) hair and dander: Secondary | ICD-10-CM | POA: Diagnosis not present

## 2024-05-08 DIAGNOSIS — J3089 Other allergic rhinitis: Secondary | ICD-10-CM | POA: Diagnosis not present

## 2024-05-17 DIAGNOSIS — J3081 Allergic rhinitis due to animal (cat) (dog) hair and dander: Secondary | ICD-10-CM | POA: Diagnosis not present

## 2024-05-17 DIAGNOSIS — J301 Allergic rhinitis due to pollen: Secondary | ICD-10-CM | POA: Diagnosis not present

## 2024-05-17 DIAGNOSIS — J3089 Other allergic rhinitis: Secondary | ICD-10-CM | POA: Diagnosis not present

## 2024-05-21 DIAGNOSIS — J301 Allergic rhinitis due to pollen: Secondary | ICD-10-CM | POA: Diagnosis not present

## 2024-05-21 DIAGNOSIS — J3081 Allergic rhinitis due to animal (cat) (dog) hair and dander: Secondary | ICD-10-CM | POA: Diagnosis not present

## 2024-05-21 DIAGNOSIS — J3089 Other allergic rhinitis: Secondary | ICD-10-CM | POA: Diagnosis not present

## 2024-05-22 ENCOUNTER — Ambulatory Visit: Payer: BC Managed Care – PPO | Admitting: Family Medicine

## 2024-05-28 ENCOUNTER — Other Ambulatory Visit: Payer: Self-pay | Admitting: Family Medicine

## 2024-05-29 ENCOUNTER — Encounter: Payer: Self-pay | Admitting: Family Medicine

## 2024-05-29 ENCOUNTER — Ambulatory Visit (INDEPENDENT_AMBULATORY_CARE_PROVIDER_SITE_OTHER): Payer: BC Managed Care – PPO | Admitting: Family Medicine

## 2024-05-29 VITALS — BP 120/80 | HR 81 | Temp 97.2°F | Ht 67.0 in | Wt 201.8 lb

## 2024-05-29 DIAGNOSIS — D649 Anemia, unspecified: Secondary | ICD-10-CM | POA: Diagnosis not present

## 2024-05-29 DIAGNOSIS — E559 Vitamin D deficiency, unspecified: Secondary | ICD-10-CM

## 2024-05-29 DIAGNOSIS — F3342 Major depressive disorder, recurrent, in full remission: Secondary | ICD-10-CM

## 2024-05-29 DIAGNOSIS — E785 Hyperlipidemia, unspecified: Secondary | ICD-10-CM

## 2024-05-29 DIAGNOSIS — I10 Essential (primary) hypertension: Secondary | ICD-10-CM

## 2024-05-29 MED ORDER — BUPROPION HCL ER (XL) 150 MG PO TB24: 150.0000 mg | ORAL_TABLET | Freq: Every day | ORAL | 3 refills | Status: AC

## 2024-05-29 MED ORDER — COLESTIPOL HCL 1 G PO TABS: 2.0000 g | ORAL_TABLET | Freq: Two times a day (BID) | ORAL | 3 refills | Status: AC

## 2024-05-29 NOTE — Progress Notes (Signed)
 Phone [phone] In person visit   Subjective:   Garrett [name]. is a 48 y.o. year old very pleasant male patient who presents for/with See problem oriented charting Chief Complaint  Patient presents with   Medical Management of Chronic Issues   Hypertension   Past Medical History-  Patient Active Problem List   Diagnosis Date Noted   Angiodysplasia of intestine 09/25/2021    Priority: Medium    Major depressive disorder with single episode, in full remission (HCC) 04/16/2021    Priority: Medium    Vitamin D  deficiency 03/04/2021    Priority: Medium    Vitamin B12 deficiency 03/01/2020    Priority: Medium    Hypertension 02/19/2014    Priority: Medium    IBS (irritable bowel syndrome) diarrhea predominant 08/14/2012    Priority: Medium    Allergic rhinitis 02/21/2012    Priority: Medium    Hyperlipidemia 09/05/2007    Priority: Medium    Depression 05/25/2007    Priority: Medium    GERD (gastroesophageal reflux disease) 11/29/2012    Priority: Low   Iron deficiency anemia due to chronic blood loss     Medications- reviewed and updated Current Outpatient Medications  Medication Sig Dispense Refill   Alpha-D-Galactosidase (BEANO PO) Take 1 tablet by mouth daily as needed (gas).     azelastine (ASTELIN) 137 MCG/SPRAY nasal spray Place 1 spray into both nostrils 2 (two) times daily. Use in each nostril as directed     budesonide (RHINOCORT AQUA) 32 MCG/ACT nasal spray Place 1 spray into both nostrils daily.     Calcium  Carbonate Antacid (TUMS PO) Take 2 tablets by mouth daily as needed (intergestion).     Cholecalciferol  (VITAMIN D -3 PO) Take 2,000 Units by mouth daily.     cyanocobalamin  1000 MCG tablet Take 1,000 mcg by mouth daily.     desloratadine (CLARINEX) 5 MG tablet Take 5 mg by mouth daily.     EPINEPHrine 0.3 mg/0.3 mL IJ SOAJ injection Inject 0.3 mg into the muscle as needed for anaphylaxis.  0   escitalopram  (LEXAPRO ) 20 MG tablet Take 1 tablet (20 mg  total) by mouth daily. 90 tablet 3   ferrous sulfate 325 (65 FE) MG tablet Take 325 mg by mouth 2 (two) times a week.     hyoscyamine  (LEVSIN  SL) 0.125 MG SL tablet Place 1 tablet (0.125 mg total) under the tongue every 4 (four) hours as needed. 60 tablet 5   lisinopril  (ZESTRIL ) 40 MG tablet Take 1 tablet (40 mg total) by mouth daily. 90 tablet 3   Loperamide HCl (IMODIUM A-D PO) Take 1 capsule by mouth as needed (Diarrhea).     NON FORMULARY Allergy shots every other week     Omega-3 Fatty Acids (FISH OIL) 1200 MG CAPS Take 1,200 mg by mouth daily.     omeprazole  (PRILOSEC) 20 MG capsule TAKE 1 CAPSULE BY MOUTH ONCE DAILY BEFORE BREAKFAST 90 capsule 0   pravastatin  (PRAVACHOL ) 40 MG tablet Take 1 tablet (40 mg total) by mouth daily. 90 tablet 3   shark liver oil-cocoa butter (PREPARATION H) 0.25-3-85.5 % suppository Place 1 suppository rectally at bedtime.     Vitamin D , Ergocalciferol , (DRISDOL ) 1.25 MG (50000 UNIT) CAPS capsule Take 1 capsule (50,000 Units total) by mouth every 7 (seven) days. 13 capsule 1   buPROPion  (WELLBUTRIN  XL) 150 MG 24 hr tablet Take 1 tablet (150 mg total) by mouth daily. 90 tablet 3   colestipol  (COLESTID ) 1 g tablet Take  2 tablets (2 g total) by mouth 2 (two) times daily. 360 tablet 3   No current facility-administered medications for this visit.     Objective:  BP 120/80   Pulse 81   Temp (!) 97.2 F (36.2 C)   Ht 5' 7 (1.702 m)   Wt 201 lb 12.8 oz (91.5 kg)   SpO2 97%   BMI 31.61 kg/m  Gen: NAD, resting comfortably CV: RRR no murmurs rubs or gallops Lungs: CTAB no crackles, wheeze, rhonchi Ext: no edema Skin: warm, dry     Assessment and Plan   #hypertension S: medication: Lisinopril  40 mg BP Readings from Last 3 Encounters:  05/29/24 120/80  11/21/23 122/68  05/12/23 120/80  Home readings #s: 130s/80's at home A/P: stable- continue current medicines    #hyperlipidemia S: Medication: Pravastatin  40 mg, fish oil, also on colestipol  for  IBS and may benefit some Lab Results  Component Value Date   CHOL 169 12/12/2023   HDL 47.50 12/12/2023   LDLCALC 72 12/12/2023   LDLDIRECT 85.0 12/14/2022   TRIG 245.0 (H) 12/12/2023   CHOLHDL 4 12/12/2023   A/P:  reasonable control- work on lifestyle to try to drive the triglyceride(s) lower  # Depression S: Medication: Wellbutrin  150 mg, Lexapro  20 mg    05/29/2024    2:13 PM 11/21/2023    2:00 PM 05/12/2023    2:26 PM  Depression screen PHQ 2/9  Decreased Interest 0 1 0  Down, Depressed, Hopeless 0 1 0  PHQ - 2 Score 0 2 0  Altered sleeping 0 1 0  Tired, decreased energy 0 1 0  Change in appetite 0 1 0  Feeling bad or failure about yourself  0 1 0  Trouble concentrating 0 1 0  Moving slowly or fidgety/restless 0 0 0  Suicidal thoughts 0 0 0  PHQ-9 Score 0 7 0  Difficult doing work/chores Not difficult at all Somewhat difficult Not difficult at all  A/P: making some slow progress after loss of father- good weeks and bad weeks- continue current medications  . Seems to be in full remission overall   # GERD-also follows with Dr. Avram S:Medication: Omeprazole  20 mg -EGD reassuring July 25, 2020 without clear cause of iron or B12 deficiency- later thought to be avms but could not reach on procedure A/P: doing reasonably well- continue current medications     #B12 deficiency (potentially related to B12 use)/iron deficiency S: Medication: Vitamin B12 1000 mcg, twice a week iron -No clear cause in 2021 on EGD or colonoscopy Lab Results  Component Value Date   VITAMINB12 795 12/12/2023  A/P: B12 looked good last time- continue current medications,    #Vitamin D  deficiency- as low as 10.5 when detected S: Medication: Has required high-dose at times including 50000 units weekly for 13 weeks and now back on 1000 units Last vitamin D  Lab Results  Component Value Date   VD25OH 26.05 (L) 12/12/2023  A/P: hopefully stable- update vitamin D  today. Continue current meds for  now  - on 1000 units and hoping that's adequate  #allergies- every week injections with Dr. Fleeta smock  -has epi pen auvi-Q  #IBS- patient continues peppermint oil, colestipol , hyoscyamine , rare use), occasional Imodium  -Dad with history of ulcerative coliits- his workup was negative though -Colonoscopy 2013 and 2021 with 10-year repeat  Recommended follow up: Return in about 6 months (around 11/29/2024) for physical or sooner if needed.Schedule b4 you leave.  Lab/Order associations:   ICD-10-CM  1. Primary hypertension  I10     2. Hyperlipidemia, unspecified hyperlipidemia type  E78.5 Comp Met (CMET)    3. Recurrent major depressive disorder, in full remission (HCC)  F33.42     4. Vitamin D  deficiency  E55.9 VITAMIN D  25 Hydroxy (Vit-D Deficiency, Fractures)    5. Anemia, unspecified type  D64.9 CBC with Differential/Platelet    IBC + Ferritin      Meds ordered this encounter  Medications   colestipol  (COLESTID ) 1 g tablet    Sig: Take 2 tablets (2 g total) by mouth 2 (two) times daily.    Dispense:  360 tablet    Refill:  3   buPROPion  (WELLBUTRIN  XL) 150 MG 24 hr tablet    Sig: Take 1 tablet (150 mg total) by mouth daily.    Dispense:  90 tablet    Refill:  3    Return precautions advised.  Garnette Lukes, MD

## 2024-05-29 NOTE — Patient Instructions (Addendum)
 Please stop by lab before you go If you have mychart- we will send your results within 3 business days of us  receiving them.  If you do not have mychart- we will call you about results within 5 business days of us  receiving them.  *please also note that you will see labs on mychart as soon as they post. I will later go in and write notes on them- will say notes from Dr. Katrinka Atlas work on gradual weight loss - encourage regular exercise as well with long term goal 150 minutes a week  Recommended follow up: Return in about 6 months (around 11/29/2024) for physical or sooner if needed.Schedule b4 you leave.

## 2024-05-30 ENCOUNTER — Ambulatory Visit: Payer: Self-pay | Admitting: Family Medicine

## 2024-05-30 DIAGNOSIS — J301 Allergic rhinitis due to pollen: Secondary | ICD-10-CM | POA: Diagnosis not present

## 2024-05-30 DIAGNOSIS — J3089 Other allergic rhinitis: Secondary | ICD-10-CM | POA: Diagnosis not present

## 2024-05-30 DIAGNOSIS — J3081 Allergic rhinitis due to animal (cat) (dog) hair and dander: Secondary | ICD-10-CM | POA: Diagnosis not present

## 2024-05-30 LAB — COMPREHENSIVE METABOLIC PANEL WITH GFR
ALT: 17 U/L (ref 0–53)
AST: 17 U/L (ref 0–37)
Albumin: 4.9 g/dL (ref 3.5–5.2)
Alkaline Phosphatase: 52 U/L (ref 39–117)
BUN: 10 mg/dL (ref 6–23)
CO2: 27 meq/L (ref 19–32)
Calcium: 9.7 mg/dL (ref 8.4–10.5)
Chloride: 102 meq/L (ref 96–112)
Creatinine, Ser: 0.81 mg/dL (ref 0.40–1.50)
GFR: 104.28 mL/min (ref >60.00)
Glucose, Bld: 74 mg/dL (ref 70–99)
Potassium: 4 meq/L (ref 3.5–5.1)
Sodium: 138 meq/L (ref 135–145)
Total Bilirubin: 0.8 mg/dL (ref 0.2–1.2)
Total Protein: 7.2 g/dL (ref 6.0–8.3)

## 2024-05-30 LAB — CBC WITH DIFFERENTIAL/PLATELET
Basophils Absolute: 0 K/uL (ref 0.0–0.1)
Basophils Relative: 0.8 % (ref 0.0–3.0)
Eosinophils Absolute: 0.1 K/uL (ref 0.0–0.7)
Eosinophils Relative: 1.8 % (ref 0.0–5.0)
HCT: 39.9 % (ref 39.0–52.0)
Hemoglobin: 13.4 g/dL (ref 13.0–17.0)
Lymphocytes Relative: 30.1 % (ref 12.0–46.0)
Lymphs Abs: 1.6 K/uL (ref 0.7–4.0)
MCHC: 33.6 g/dL (ref 30.0–36.0)
MCV: 83.4 fl (ref 78.0–100.0)
Monocytes Absolute: 0.4 K/uL (ref 0.1–1.0)
Monocytes Relative: 7.6 % (ref 3.0–12.0)
Neutro Abs: 3.1 K/uL (ref 1.4–7.7)
Neutrophils Relative %: 59.7 % (ref 43.0–77.0)
Platelets: 239 K/uL (ref 150.0–400.0)
RBC: 4.79 Mil/uL (ref 4.22–5.81)
RDW: 13.2 % (ref 11.5–15.5)
WBC: 5.2 K/uL (ref 4.0–10.5)

## 2024-05-30 LAB — VITAMIN D 25 HYDROXY (VIT D DEFICIENCY, FRACTURES): VITD: 23.31 ng/mL — ABNORMAL LOW (ref 30.00–100.00)

## 2024-05-30 LAB — IBC + FERRITIN
Ferritin: 59 ng/mL (ref 22.0–322.0)
Iron: 59 ug/dL (ref 42–165)
Saturation Ratios: 14.9 % — ABNORMAL LOW (ref 20.0–50.0)
TIBC: 394.8 ug/dL (ref 250.0–450.0)
Transferrin: 282 mg/dL (ref 212.0–360.0)

## 2024-05-30 MED ORDER — VITAMIN D (ERGOCALCIFEROL) 1.25 MG (50000 UNIT) PO CAPS: 50000.0000 [IU] | ORAL_CAPSULE | ORAL | 1 refills | Status: AC

## 2024-06-04 DIAGNOSIS — J301 Allergic rhinitis due to pollen: Secondary | ICD-10-CM | POA: Diagnosis not present

## 2024-06-04 DIAGNOSIS — J3081 Allergic rhinitis due to animal (cat) (dog) hair and dander: Secondary | ICD-10-CM | POA: Diagnosis not present

## 2024-06-04 DIAGNOSIS — J3089 Other allergic rhinitis: Secondary | ICD-10-CM | POA: Diagnosis not present

## 2024-06-12 DIAGNOSIS — J301 Allergic rhinitis due to pollen: Secondary | ICD-10-CM | POA: Diagnosis not present

## 2024-06-12 DIAGNOSIS — J3081 Allergic rhinitis due to animal (cat) (dog) hair and dander: Secondary | ICD-10-CM | POA: Diagnosis not present

## 2024-06-12 DIAGNOSIS — J3089 Other allergic rhinitis: Secondary | ICD-10-CM | POA: Diagnosis not present

## 2024-06-18 DIAGNOSIS — J3089 Other allergic rhinitis: Secondary | ICD-10-CM | POA: Diagnosis not present

## 2024-06-18 DIAGNOSIS — J3081 Allergic rhinitis due to animal (cat) (dog) hair and dander: Secondary | ICD-10-CM | POA: Diagnosis not present

## 2024-06-18 DIAGNOSIS — J301 Allergic rhinitis due to pollen: Secondary | ICD-10-CM | POA: Diagnosis not present

## 2024-07-02 DIAGNOSIS — J301 Allergic rhinitis due to pollen: Secondary | ICD-10-CM | POA: Diagnosis not present

## 2024-07-02 DIAGNOSIS — J3081 Allergic rhinitis due to animal (cat) (dog) hair and dander: Secondary | ICD-10-CM | POA: Diagnosis not present

## 2024-07-03 DIAGNOSIS — J3089 Other allergic rhinitis: Secondary | ICD-10-CM | POA: Diagnosis not present

## 2024-07-05 DIAGNOSIS — J301 Allergic rhinitis due to pollen: Secondary | ICD-10-CM | POA: Diagnosis not present

## 2024-07-05 DIAGNOSIS — J3089 Other allergic rhinitis: Secondary | ICD-10-CM | POA: Diagnosis not present

## 2024-07-05 DIAGNOSIS — J3081 Allergic rhinitis due to animal (cat) (dog) hair and dander: Secondary | ICD-10-CM | POA: Diagnosis not present

## 2024-07-10 DIAGNOSIS — J301 Allergic rhinitis due to pollen: Secondary | ICD-10-CM | POA: Diagnosis not present

## 2024-07-10 DIAGNOSIS — J3081 Allergic rhinitis due to animal (cat) (dog) hair and dander: Secondary | ICD-10-CM | POA: Diagnosis not present

## 2024-07-10 DIAGNOSIS — J3089 Other allergic rhinitis: Secondary | ICD-10-CM | POA: Diagnosis not present

## 2024-07-18 DIAGNOSIS — J3081 Allergic rhinitis due to animal (cat) (dog) hair and dander: Secondary | ICD-10-CM | POA: Diagnosis not present

## 2024-07-18 DIAGNOSIS — J301 Allergic rhinitis due to pollen: Secondary | ICD-10-CM | POA: Diagnosis not present

## 2024-07-18 DIAGNOSIS — J3089 Other allergic rhinitis: Secondary | ICD-10-CM | POA: Diagnosis not present

## 2024-07-27 DIAGNOSIS — J301 Allergic rhinitis due to pollen: Secondary | ICD-10-CM | POA: Diagnosis not present

## 2024-07-27 DIAGNOSIS — J3089 Other allergic rhinitis: Secondary | ICD-10-CM | POA: Diagnosis not present

## 2024-07-27 DIAGNOSIS — J3081 Allergic rhinitis due to animal (cat) (dog) hair and dander: Secondary | ICD-10-CM | POA: Diagnosis not present

## 2024-07-30 DIAGNOSIS — J301 Allergic rhinitis due to pollen: Secondary | ICD-10-CM | POA: Diagnosis not present

## 2024-07-30 DIAGNOSIS — J3089 Other allergic rhinitis: Secondary | ICD-10-CM | POA: Diagnosis not present

## 2024-07-30 DIAGNOSIS — J3081 Allergic rhinitis due to animal (cat) (dog) hair and dander: Secondary | ICD-10-CM | POA: Diagnosis not present

## 2024-08-06 DIAGNOSIS — J3089 Other allergic rhinitis: Secondary | ICD-10-CM | POA: Diagnosis not present

## 2024-08-06 DIAGNOSIS — J301 Allergic rhinitis due to pollen: Secondary | ICD-10-CM | POA: Diagnosis not present

## 2024-08-06 DIAGNOSIS — J3081 Allergic rhinitis due to animal (cat) (dog) hair and dander: Secondary | ICD-10-CM | POA: Diagnosis not present

## 2024-08-15 DIAGNOSIS — J301 Allergic rhinitis due to pollen: Secondary | ICD-10-CM | POA: Diagnosis not present

## 2024-08-15 DIAGNOSIS — J3081 Allergic rhinitis due to animal (cat) (dog) hair and dander: Secondary | ICD-10-CM | POA: Diagnosis not present

## 2024-08-15 DIAGNOSIS — J3089 Other allergic rhinitis: Secondary | ICD-10-CM | POA: Diagnosis not present

## 2024-08-20 DIAGNOSIS — J3089 Other allergic rhinitis: Secondary | ICD-10-CM | POA: Diagnosis not present

## 2024-08-20 DIAGNOSIS — J3081 Allergic rhinitis due to animal (cat) (dog) hair and dander: Secondary | ICD-10-CM | POA: Diagnosis not present

## 2024-08-20 DIAGNOSIS — J301 Allergic rhinitis due to pollen: Secondary | ICD-10-CM | POA: Diagnosis not present

## 2024-08-28 DIAGNOSIS — J301 Allergic rhinitis due to pollen: Secondary | ICD-10-CM | POA: Diagnosis not present

## 2024-08-28 DIAGNOSIS — J3081 Allergic rhinitis due to animal (cat) (dog) hair and dander: Secondary | ICD-10-CM | POA: Diagnosis not present

## 2024-08-28 DIAGNOSIS — J3089 Other allergic rhinitis: Secondary | ICD-10-CM | POA: Diagnosis not present

## 2024-09-03 DIAGNOSIS — J3081 Allergic rhinitis due to animal (cat) (dog) hair and dander: Secondary | ICD-10-CM | POA: Diagnosis not present

## 2024-09-03 DIAGNOSIS — J301 Allergic rhinitis due to pollen: Secondary | ICD-10-CM | POA: Diagnosis not present

## 2024-09-03 DIAGNOSIS — J3089 Other allergic rhinitis: Secondary | ICD-10-CM | POA: Diagnosis not present

## 2024-09-10 DIAGNOSIS — J3081 Allergic rhinitis due to animal (cat) (dog) hair and dander: Secondary | ICD-10-CM | POA: Diagnosis not present

## 2024-09-10 DIAGNOSIS — J3089 Other allergic rhinitis: Secondary | ICD-10-CM | POA: Diagnosis not present

## 2024-09-10 DIAGNOSIS — J301 Allergic rhinitis due to pollen: Secondary | ICD-10-CM | POA: Diagnosis not present

## 2024-09-17 DIAGNOSIS — J3089 Other allergic rhinitis: Secondary | ICD-10-CM | POA: Diagnosis not present

## 2024-09-17 DIAGNOSIS — J3081 Allergic rhinitis due to animal (cat) (dog) hair and dander: Secondary | ICD-10-CM | POA: Diagnosis not present

## 2024-09-17 DIAGNOSIS — J301 Allergic rhinitis due to pollen: Secondary | ICD-10-CM | POA: Diagnosis not present

## 2024-09-24 DIAGNOSIS — J301 Allergic rhinitis due to pollen: Secondary | ICD-10-CM | POA: Diagnosis not present

## 2024-09-24 DIAGNOSIS — J3089 Other allergic rhinitis: Secondary | ICD-10-CM | POA: Diagnosis not present

## 2024-09-24 DIAGNOSIS — J3081 Allergic rhinitis due to animal (cat) (dog) hair and dander: Secondary | ICD-10-CM | POA: Diagnosis not present

## 2024-10-08 DIAGNOSIS — J301 Allergic rhinitis due to pollen: Secondary | ICD-10-CM | POA: Diagnosis not present

## 2024-10-08 DIAGNOSIS — R052 Subacute cough: Secondary | ICD-10-CM | POA: Diagnosis not present

## 2024-10-08 DIAGNOSIS — J019 Acute sinusitis, unspecified: Secondary | ICD-10-CM | POA: Diagnosis not present

## 2024-10-08 DIAGNOSIS — J3081 Allergic rhinitis due to animal (cat) (dog) hair and dander: Secondary | ICD-10-CM | POA: Diagnosis not present

## 2024-10-09 DIAGNOSIS — R052 Subacute cough: Secondary | ICD-10-CM | POA: Diagnosis not present

## 2024-11-30 ENCOUNTER — Encounter: Payer: Self-pay | Admitting: Family Medicine

## 2024-11-30 ENCOUNTER — Ambulatory Visit (INDEPENDENT_AMBULATORY_CARE_PROVIDER_SITE_OTHER): Admitting: Family Medicine

## 2024-11-30 DIAGNOSIS — E538 Deficiency of other specified B group vitamins: Secondary | ICD-10-CM

## 2024-11-30 DIAGNOSIS — E559 Vitamin D deficiency, unspecified: Secondary | ICD-10-CM

## 2024-11-30 DIAGNOSIS — E785 Hyperlipidemia, unspecified: Secondary | ICD-10-CM | POA: Diagnosis not present

## 2024-11-30 DIAGNOSIS — Z131 Encounter for screening for diabetes mellitus: Secondary | ICD-10-CM

## 2024-11-30 DIAGNOSIS — E66811 Obesity, class 1: Secondary | ICD-10-CM

## 2024-11-30 DIAGNOSIS — E6609 Other obesity due to excess calories: Secondary | ICD-10-CM | POA: Diagnosis not present

## 2024-11-30 DIAGNOSIS — Z6831 Body mass index (BMI) 31.0-31.9, adult: Secondary | ICD-10-CM

## 2024-11-30 DIAGNOSIS — D649 Anemia, unspecified: Secondary | ICD-10-CM | POA: Diagnosis not present

## 2024-11-30 NOTE — Progress Notes (Signed)
 " Phone: [phone]    Subjective:  Patient presents today for their annual physical. Chief complaint-noted.   See problem oriented charting- ROS- full  review of systems was completed and negative  except for topics noted under acute/chronic concerns  The following were reviewed and entered/updated in epic: Past Medical History:  Diagnosis Date   Acne    Allergy    Anemia    Anxiety    Depression    External hemorrhoids    2004   GERD (gastroesophageal reflux disease)    HLD (hyperlipidemia)    Hypertension    Irritable bowel syndrome    Strabismus    Vitamin D  deficiency 03/04/2021   Patient Active Problem List   Diagnosis Date Noted   Angiodysplasia of intestine 09/25/2021    Priority: Medium    Major depressive disorder with single episode, in full remission 04/16/2021    Priority: Medium    Vitamin D  deficiency 03/04/2021    Priority: Medium    Vitamin B12 deficiency 03/01/2020    Priority: Medium    Hypertension 02/19/2014    Priority: Medium    IBS (irritable bowel syndrome) diarrhea predominant 08/14/2012    Priority: Medium    Allergic rhinitis 02/21/2012    Priority: Medium    Hyperlipidemia 09/05/2007    Priority: Medium    Depression 05/25/2007    Priority: Medium    GERD (gastroesophageal reflux disease) 11/29/2012    Priority: Low   Iron deficiency anemia due to chronic blood loss    Past Surgical History:  Procedure Laterality Date   COLONOSCOPY  10/16/2012   ENTEROSCOPY N/A 11/30/2021   Procedure: ENTEROSCOPY;  Surgeon: Avram Lupita BRAVO, MD;  Location: [facility];  Service: Endoscopy;  Laterality: N/A;   ESOPHAGOGASTRODUODENOSCOPY  2021   EYE SURGERY     FLEXIBLE SIGMOIDOSCOPY     hemorrhoids-rule out internal bleeding   STRABISMUS SURGERY  2001, 2010   X 2   SUBMUCOSAL TATTOO INJECTION  11/30/2021   Procedure: SUBMUCOSAL TATTOO INJECTION;  Surgeon: Avram Lupita BRAVO, MD;  Location: [facility];  Service: Endoscopy;;   WISDOM TOOTH  EXTRACTION      Family History  Problem Relation Age of Onset   Breast cancer Mother    Hypertension Mother    Hyperlipidemia Mother    Cancer Mother    Prostate cancer Father        late 68s   Ulcerative colitis Father        died age 80   Hypertension Father    Hyperlipidemia Father    Asthma Father    Colon cancer Neg Hx    Rectal cancer Neg Hx    Stomach cancer Neg Hx    Esophageal cancer Neg Hx     Medications- reviewed and updated Current Outpatient Medications  Medication Sig Dispense Refill   Alpha-D-Galactosidase (BEANO PO) Take 1 tablet by mouth daily as needed (gas).     azelastine (ASTELIN) 137 MCG/SPRAY nasal spray Place 1 spray into both nostrils 2 (two) times daily. Use in each nostril as directed     budesonide (RHINOCORT AQUA) 32 MCG/ACT nasal spray Place 1 spray into both nostrils daily.     buPROPion  (WELLBUTRIN  XL) 150 MG 24 hr tablet Take 1 tablet (150 mg total) by mouth daily. 90 tablet 3   Calcium  Carbonate Antacid (TUMS PO) Take 2 tablets by mouth daily as needed (intergestion).     Cholecalciferol  (VITAMIN D -3 PO) Take 2,000 Units by mouth daily.  colestipol  (COLESTID ) 1 g tablet Take 2 tablets (2 g total) by mouth 2 (two) times daily. 360 tablet 3   cyanocobalamin  1000 MCG tablet Take 1,000 mcg by mouth daily.     desloratadine (CLARINEX) 5 MG tablet Take 5 mg by mouth daily.     EPINEPHrine 0.3 mg/0.3 mL IJ SOAJ injection Inject 0.3 mg into the muscle as needed for anaphylaxis.  0   escitalopram  (LEXAPRO ) 20 MG tablet Take 1 tablet (20 mg total) by mouth daily. 90 tablet 3   ferrous sulfate 325 (65 FE) MG tablet Take 325 mg by mouth 2 (two) times a week.     hyoscyamine  (LEVSIN  SL) 0.125 MG SL tablet Place 1 tablet (0.125 mg total) under the tongue every 4 (four) hours as needed. 60 tablet 5   lisinopril  (ZESTRIL ) 40 MG tablet Take 1 tablet (40 mg total) by mouth daily. 90 tablet 3   Loperamide HCl (IMODIUM A-D PO) Take 1 capsule by mouth as needed  (Diarrhea).     NON FORMULARY Allergy shots every other week     Omega-3 Fatty Acids (FISH OIL) 1200 MG CAPS Take 1,200 mg by mouth daily.     omeprazole  (PRILOSEC) 20 MG capsule TAKE 1 CAPSULE BY MOUTH ONCE DAILY BEFORE BREAKFAST 90 capsule 0   pravastatin  (PRAVACHOL ) 40 MG tablet Take 1 tablet (40 mg total) by mouth daily. 90 tablet 3   shark liver oil-cocoa butter (PREPARATION H) 0.25-3-85.5 % suppository Place 1 suppository rectally at bedtime.     Vitamin D , Ergocalciferol , (DRISDOL ) 1.25 MG (50000 UNIT) CAPS capsule Take 1 capsule (50,000 Units total) by mouth every 7 (seven) days. 13 capsule 1   No current facility-administered medications for this visit.    Allergies-reviewed and updated Allergies[1]  Social History   Social History Narrative   Single. 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      Hobbies: reading, time at mall, movies      Habits never smoker 2 caffeinated beverages daily no alcohol no drug use no other tobacco      Objective:  BP 122/72 (BP Location: Left Arm, Patient Position: Sitting, Cuff Size: Normal)   Pulse 92   Temp 97.7 F (36.5 C) (Temporal)   Ht 5' 7 (1.702 m)   Wt 203 lb 3.2 oz (92.2 kg)   SpO2 98%   BMI 31.83 kg/m  Gen: NAD, resting comfortably HEENT: Mucous membranes are moist. Oropharynx normal Neck: no thyromegaly CV: RRR no murmurs rubs or gallops Lungs: CTAB no crackles, wheeze, rhonchi Abdomen: soft/nontender/nondistended/normal bowel sounds. No rebound or guarding.  Ext: no edema Skin: warm, dry Neuro: grossly normal, moves all extremities, PERRLA    Assessment and Plan:  49 y.o. male presenting for annual physical.  Health Maintenance counseling: 1. Anticipatory guidance: Patient counseled regarding regular dental exams -q6 months, eye exams - due for this,  avoiding smoking and second hand smoke , limiting alcohol to 2  beverages per day- 2 a month, no illicit drugs.   2. Risk factor reduction:  Advised patient of need for regular exercise and diet rich and fruits and vegetables to reduce risk of heart attack and stroke.  Exercise- feels room for improvement-encouraged goal 150 minutes a week .  Diet/weight management-considering weight watchers- think this would help- mild gradual weight increase but some likely related to grieving.  Wt  Readings from Last 3 Encounters:  11/30/24 203 lb 3.2 oz (92.2 kg)  05/29/24 201 lb 12.8 oz (91.5 kg)  11/21/23 199 lb 6.4 oz (90.4 kg)  3. Immunizations/screenings/ancillary studies- opts out COVID but otherwise up to date  Immunization History  Administered Date(s) Administered   Fluzone Influenza virus vaccine,trivalent (IIV3), split virus 08/21/2024   INFLUENZA, HIGH DOSE SEASONAL PF 08/29/2017, 12/10/2021   Influenza Split 09/11/2012   Influenza Whole 08/22/2005, 09/04/2007, 08/03/2010, 08/26/2020   Influenza,inj,Quad PF,6+ Mos 08/09/2013, 09/08/2016, 07/19/2017, 07/31/2018, 07/31/2019, 09/20/2022   Influenza-Unspecified 09/05/2014, 08/21/2015, 08/14/2021, 09/15/2023   PFIZER(Purple Top)SARS-COV-2 Vaccination 12/06/2019, 12/25/2019, 04/08/2020, 10/13/2020   Pfizer Covid-19 Vaccine Bivalent Booster 31yrs & up 10/28/2021   Td 01/29/2010   Tdap 03/12/2008, 02/29/2020  4. Prostate cancer screening- planning to start annual at 50 with dads history of prostate cancer though in his 55s  5. Colon cancer screening - 08/14/20 with 10 year follow up - mainly done due to anemia issues 6. Skin cancer screening/prevention- plans to schedule- does every several years. advised regular sunscreen use. Denies worrisome, changing, or new skin lesions.  7. Testicular cancer screening- advised monthly self exams  8. STD screening- patient opts out- not dating 53. Smoking associated screening- never smoker  Status of chronic or acute concerns   #hypertension S: medication: Lisinopril  40  mg  BP Readings from Last 3 Encounters:  11/30/24 122/72  05/29/24 120/80  11/21/23 122/68  A/P: well controlled continue current medications    #hyperlipidemia S: Medication: Pravastatin  40 mg, fish oil  Lab Results  Component Value Date   CHOL 169 12/12/2023   HDL 47.50 12/12/2023   LDLCALC 72 12/12/2023   LDLDIRECT 85.0 12/14/2022   TRIG 245.0 (H) 12/12/2023   CHOLHDL 4 12/12/2023  A/P: LDL close to ideal goal but triglyceride(s) slightly high- encouraged exercise and weight loss  # Depression S: Medication: Wellbutrin  150 mg, Lexapro  20 mg    11/30/2024    2:04 PM 05/29/2024    2:13 PM 11/21/2023    2:00 PM  Depression screen PHQ 2/9  Decreased Interest 0 0 1  Down, Depressed, Hopeless 0 0 1  PHQ - 2 Score 0 0 2  Altered sleeping 0 0 1  Tired, decreased energy 0 0 1  Change in appetite 0 0 1  Feeling bad or failure about yourself  0 0 1  Trouble concentrating 0 0 1  Moving slowly or fidgety/restless 0 0 0  Suicidal thoughts 0 0 0  PHQ-9 Score 0 0  7   Difficult doing work/chores Not difficult at all Not difficult at all Somewhat difficult     Data saved with a previous flowsheet row definition  A/P: full remission- most of issues are grieving related still    # GERD-also follows with Dr. Avram S:Medication: Omeprazole  20 mg -EGD reassuring July 25, 2020 without clear cause of iron or B12 deficiency- later thought to be avms but could not reach on procedure A/P: reasonable control- continue current medications     #B12 deficiency (potentially related to B12 use)/iron deficiency S: Medication: Vitamin B12 1000 mcg daily, twice a week iron with arteriovenous malformation(s) (AVM) history -No clear cause in 2021 on EGD or colonoscopy A/P: check B12 and iron stores   #Vitamin D  deficiency- as low as 10.5 when detected S: Medication: Has required high-dose at times- treated last time with this- now back on 2000 units A/P: hopefully stable- update vitamin d   today. Continue current meds for now    #  allergies- every other weekly injections with Dr. Fleeta smock outside of illness  -has epi pen auvi-Q - did get treated by allergist for sinusitis/potential pneumonia but x-ray ok   #IBS- patient continues peppermint oil, colestipol , hyoscyamine ,  occasional Imodium  -Dad with history of ulcerative coliits- his workup was negative though -Colonoscopy 2013 and 2021 with 10-year repeat  Recommended follow up: Return in about 6 months (around 05/30/2025) for followup or sooner if needed.Schedule b4 you leave.  Lab/Order associations:NOT fasting   ICD-10-CM   1. Hyperlipidemia, unspecified hyperlipidemia type  E78.5 Comp Met (CMET)    Lipid panel    2. Vitamin D  deficiency  E55.9 VITAMIN D  25 Hydroxy (Vit-D Deficiency, Fractures)    3. Vitamin B12 deficiency  E53.8 Vitamin B12    4. Anemia, unspecified type  D64.9 CBC with Differential/Platelet    IBC + Ferritin    5. Screening for diabetes mellitus  Z13.1 Hemoglobin A1c    6. Class 1 obesity due to excess calories with serious comorbidity and body mass index (BMI) of 31.0 to 31.9 in adult  E66.811 Hemoglobin A1c   E66.09    Z68.31       No orders of the defined types were placed in this encounter.   Return precautions advised.   Garnette Lukes, MD      [1] No Known Allergies  "

## 2024-11-30 NOTE — Patient Instructions (Signed)
 Goal 150 minutes a week walking  I also like your idea of weight watchers to get going again on weight loss  Schedule a lab visit at the check out desk within 2 weeks. Return for future fasting labs meaning nothing but water after midnight please. Ok to take your medications with water.   Recommended follow up: Return in about 6 months (around 05/30/2025) for followup or sooner if needed.Schedule b4 you leave.

## 2024-12-05 ENCOUNTER — Other Ambulatory Visit

## 2024-12-05 DIAGNOSIS — Z6831 Body mass index (BMI) 31.0-31.9, adult: Secondary | ICD-10-CM

## 2024-12-05 DIAGNOSIS — E785 Hyperlipidemia, unspecified: Secondary | ICD-10-CM

## 2024-12-05 DIAGNOSIS — Z131 Encounter for screening for diabetes mellitus: Secondary | ICD-10-CM

## 2024-12-05 DIAGNOSIS — E559 Vitamin D deficiency, unspecified: Secondary | ICD-10-CM | POA: Diagnosis not present

## 2024-12-05 DIAGNOSIS — E6609 Other obesity due to excess calories: Secondary | ICD-10-CM

## 2024-12-05 DIAGNOSIS — E538 Deficiency of other specified B group vitamins: Secondary | ICD-10-CM

## 2024-12-05 DIAGNOSIS — D649 Anemia, unspecified: Secondary | ICD-10-CM

## 2024-12-05 DIAGNOSIS — E66811 Obesity, class 1: Secondary | ICD-10-CM

## 2024-12-05 LAB — COMPREHENSIVE METABOLIC PANEL WITH GFR
ALT: 22 U/L (ref 3–53)
AST: 16 U/L (ref 5–37)
Albumin: 5.1 g/dL (ref 3.5–5.2)
Alkaline Phosphatase: 56 U/L (ref 39–117)
BUN: 9 mg/dL (ref 6–23)
CO2: 26 meq/L (ref 19–32)
Calcium: 9.8 mg/dL (ref 8.4–10.5)
Chloride: 99 meq/L (ref 96–112)
Creatinine, Ser: 0.81 mg/dL (ref 0.40–1.50)
GFR: 103.9 mL/min
Glucose, Bld: 84 mg/dL (ref 70–99)
Potassium: 3.9 meq/L (ref 3.5–5.1)
Sodium: 134 meq/L — ABNORMAL LOW (ref 135–145)
Total Bilirubin: 1.1 mg/dL (ref 0.2–1.2)
Total Protein: 7.6 g/dL (ref 6.0–8.3)

## 2024-12-05 LAB — CBC WITH DIFFERENTIAL/PLATELET
Basophils Absolute: 0 K/uL (ref 0.0–0.1)
Basophils Relative: 0.4 % (ref 0.0–3.0)
Eosinophils Absolute: 0.1 K/uL (ref 0.0–0.7)
Eosinophils Relative: 1.4 % (ref 0.0–5.0)
HCT: 41.9 % (ref 39.0–52.0)
Hemoglobin: 14.3 g/dL (ref 13.0–17.0)
Lymphocytes Relative: 31.6 % (ref 12.0–46.0)
Lymphs Abs: 1.7 K/uL (ref 0.7–4.0)
MCHC: 34.1 g/dL (ref 30.0–36.0)
MCV: 83.1 fl (ref 78.0–100.0)
Monocytes Absolute: 0.4 K/uL (ref 0.1–1.0)
Monocytes Relative: 8.1 % (ref 3.0–12.0)
Neutro Abs: 3.2 K/uL (ref 1.4–7.7)
Neutrophils Relative %: 58.5 % (ref 43.0–77.0)
Platelets: 240 K/uL (ref 150.0–400.0)
RBC: 5.04 Mil/uL (ref 4.22–5.81)
RDW: 13.7 % (ref 11.5–15.5)
WBC: 5.4 K/uL (ref 4.0–10.5)

## 2024-12-05 LAB — IBC + FERRITIN
Ferritin: 116.9 ng/mL (ref 22.0–322.0)
Iron: 92 ug/dL (ref 42–165)
Saturation Ratios: 21.6 % (ref 20.0–50.0)
TIBC: 425.6 ug/dL (ref 250.0–450.0)
Transferrin: 304 mg/dL (ref 212.0–360.0)

## 2024-12-05 LAB — VITAMIN D 25 HYDROXY (VIT D DEFICIENCY, FRACTURES): VITD: 32.2 ng/mL (ref 30.00–100.00)

## 2024-12-05 LAB — LIPID PANEL
Cholesterol: 158 mg/dL (ref 28–200)
HDL: 43.7 mg/dL
LDL Cholesterol: 63 mg/dL (ref 10–99)
NonHDL: 113.9
Total CHOL/HDL Ratio: 4
Triglycerides: 255 mg/dL — ABNORMAL HIGH (ref 10.0–149.0)
VLDL: 51 mg/dL — ABNORMAL HIGH (ref 0.0–40.0)

## 2024-12-05 LAB — VITAMIN B12: Vitamin B-12: 1351 pg/mL — ABNORMAL HIGH (ref 211–911)

## 2024-12-05 LAB — HEMOGLOBIN A1C: Hgb A1c MFr Bld: 5.6 % (ref 4.6–6.5)

## 2024-12-06 ENCOUNTER — Ambulatory Visit: Payer: Self-pay | Admitting: Family Medicine

## 2024-12-12 ENCOUNTER — Other Ambulatory Visit: Payer: Self-pay | Admitting: Family Medicine

## 2024-12-27 ENCOUNTER — Telehealth: Payer: Self-pay | Admitting: Family Medicine

## 2024-12-27 NOTE — Telephone Encounter (Signed)
 LVM to reschedule pt's 06/04/25 OV since the provider will no longer be in office that day

## 2025-06-04 ENCOUNTER — Ambulatory Visit: Admitting: Family Medicine
